# Patient Record
Sex: Female | Born: 1975 | Race: White | Hispanic: No | Marital: Married | Smoking: Former smoker
Health system: Southern US, Community
[De-identification: ages and names within clinical notes are randomized; demographics above are authoritative.]

## PROBLEM LIST (undated history)

## (undated) DIAGNOSIS — F32A Depression, unspecified: Secondary | ICD-10-CM

## (undated) DIAGNOSIS — R51 Headache: Secondary | ICD-10-CM

## (undated) DIAGNOSIS — C50919 Malignant neoplasm of unspecified site of unspecified female breast: Secondary | ICD-10-CM

## (undated) DIAGNOSIS — Z923 Personal history of irradiation: Secondary | ICD-10-CM

## (undated) DIAGNOSIS — R000 Tachycardia, unspecified: Secondary | ICD-10-CM

## (undated) DIAGNOSIS — I4510 Unspecified right bundle-branch block: Secondary | ICD-10-CM

## (undated) DIAGNOSIS — R87619 Unspecified abnormal cytological findings in specimens from cervix uteri: Secondary | ICD-10-CM

## (undated) DIAGNOSIS — I10 Essential (primary) hypertension: Secondary | ICD-10-CM

## (undated) DIAGNOSIS — R002 Palpitations: Secondary | ICD-10-CM

## (undated) DIAGNOSIS — K219 Gastro-esophageal reflux disease without esophagitis: Secondary | ICD-10-CM

## (undated) DIAGNOSIS — F329 Major depressive disorder, single episode, unspecified: Secondary | ICD-10-CM

## (undated) DIAGNOSIS — IMO0002 Reserved for concepts with insufficient information to code with codable children: Secondary | ICD-10-CM

## (undated) DIAGNOSIS — Z8759 Personal history of other complications of pregnancy, childbirth and the puerperium: Secondary | ICD-10-CM

## (undated) DIAGNOSIS — N63 Unspecified lump in unspecified breast: Secondary | ICD-10-CM

## (undated) DIAGNOSIS — R87629 Unspecified abnormal cytological findings in specimens from vagina: Secondary | ICD-10-CM

## (undated) DIAGNOSIS — O139 Gestational [pregnancy-induced] hypertension without significant proteinuria, unspecified trimester: Secondary | ICD-10-CM

## (undated) DIAGNOSIS — F419 Anxiety disorder, unspecified: Secondary | ICD-10-CM

## (undated) DIAGNOSIS — R55 Syncope and collapse: Secondary | ICD-10-CM

## (undated) HISTORY — DX: Unspecified abnormal cytological findings in specimens from cervix uteri: R87.619

## (undated) HISTORY — DX: Unspecified lump in unspecified breast: N63.0

## (undated) HISTORY — DX: Gastro-esophageal reflux disease without esophagitis: K21.9

## (undated) HISTORY — DX: Personal history of other complications of pregnancy, childbirth and the puerperium: Z87.59

## (undated) HISTORY — DX: Syncope and collapse: R55

## (undated) HISTORY — DX: Headache: R51

## (undated) HISTORY — DX: Major depressive disorder, single episode, unspecified: F32.9

## (undated) HISTORY — DX: Anxiety disorder, unspecified: F41.9

## (undated) HISTORY — DX: Palpitations: R00.2

## (undated) HISTORY — DX: Tachycardia, unspecified: R00.0

## (undated) HISTORY — DX: Malignant neoplasm of unspecified site of unspecified female breast: C50.919

## (undated) HISTORY — DX: Unspecified right bundle-branch block: I45.10

## (undated) HISTORY — DX: Depression, unspecified: F32.A

## (undated) HISTORY — DX: Reserved for concepts with insufficient information to code with codable children: IMO0002

## (undated) HISTORY — PX: CHOLECYSTECTOMY: SHX55

---

## 1999-05-31 ENCOUNTER — Encounter: Admission: RE | Admit: 1999-05-31 | Discharge: 1999-05-31 | Payer: Self-pay | Admitting: General Surgery

## 1999-05-31 ENCOUNTER — Encounter: Payer: Self-pay | Admitting: General Surgery

## 2000-04-10 ENCOUNTER — Other Ambulatory Visit: Admission: RE | Admit: 2000-04-10 | Discharge: 2000-04-10 | Payer: Self-pay | Admitting: Obstetrics and Gynecology

## 2001-04-02 ENCOUNTER — Other Ambulatory Visit: Admission: RE | Admit: 2001-04-02 | Discharge: 2001-04-02 | Payer: Self-pay | Admitting: Obstetrics and Gynecology

## 2002-04-15 ENCOUNTER — Other Ambulatory Visit: Admission: RE | Admit: 2002-04-15 | Discharge: 2002-04-15 | Payer: Self-pay | Admitting: Obstetrics and Gynecology

## 2003-05-03 ENCOUNTER — Other Ambulatory Visit: Admission: RE | Admit: 2003-05-03 | Discharge: 2003-05-03 | Payer: Self-pay | Admitting: Obstetrics and Gynecology

## 2004-05-10 ENCOUNTER — Other Ambulatory Visit: Admission: RE | Admit: 2004-05-10 | Discharge: 2004-05-10 | Payer: Self-pay | Admitting: Obstetrics and Gynecology

## 2005-05-17 ENCOUNTER — Other Ambulatory Visit: Admission: RE | Admit: 2005-05-17 | Discharge: 2005-05-17 | Payer: Self-pay | Admitting: Obstetrics and Gynecology

## 2006-02-19 HISTORY — PX: THERAPEUTIC ABORTION: SHX798

## 2006-02-19 HISTORY — PX: DILATION AND CURETTAGE OF UTERUS: SHX78

## 2006-06-24 ENCOUNTER — Inpatient Hospital Stay (HOSPITAL_COMMUNITY): Admission: AD | Admit: 2006-06-24 | Discharge: 2006-06-24 | Payer: Self-pay | Admitting: Obstetrics and Gynecology

## 2006-07-04 ENCOUNTER — Inpatient Hospital Stay (HOSPITAL_COMMUNITY): Admission: AD | Admit: 2006-07-04 | Discharge: 2006-07-04 | Payer: Self-pay | Admitting: Obstetrics and Gynecology

## 2007-05-28 ENCOUNTER — Inpatient Hospital Stay (HOSPITAL_COMMUNITY): Admission: AD | Admit: 2007-05-28 | Discharge: 2007-05-28 | Payer: Self-pay | Admitting: Obstetrics and Gynecology

## 2007-06-12 ENCOUNTER — Other Ambulatory Visit: Payer: Self-pay

## 2007-06-12 ENCOUNTER — Other Ambulatory Visit: Payer: Self-pay | Admitting: Obstetrics and Gynecology

## 2007-06-12 ENCOUNTER — Emergency Department (HOSPITAL_COMMUNITY): Admission: EM | Admit: 2007-06-12 | Discharge: 2007-06-13 | Payer: Self-pay | Admitting: Emergency Medicine

## 2007-06-17 HISTORY — PX: US ECHOCARDIOGRAPHY: HXRAD669

## 2007-11-03 ENCOUNTER — Observation Stay (HOSPITAL_COMMUNITY): Admission: AD | Admit: 2007-11-03 | Discharge: 2007-11-04 | Payer: Self-pay | Admitting: Obstetrics and Gynecology

## 2007-11-13 ENCOUNTER — Inpatient Hospital Stay (HOSPITAL_COMMUNITY): Admission: AD | Admit: 2007-11-13 | Discharge: 2007-11-15 | Payer: Self-pay | Admitting: Obstetrics and Gynecology

## 2007-12-13 ENCOUNTER — Inpatient Hospital Stay (HOSPITAL_COMMUNITY): Admission: AD | Admit: 2007-12-13 | Discharge: 2007-12-15 | Payer: Self-pay | Admitting: Obstetrics and Gynecology

## 2007-12-16 ENCOUNTER — Encounter: Admission: RE | Admit: 2007-12-16 | Discharge: 2008-01-15 | Payer: Self-pay | Admitting: Obstetrics and Gynecology

## 2008-02-20 HISTORY — PX: LIPOMA EXCISION: SHX5283

## 2008-05-25 ENCOUNTER — Encounter (INDEPENDENT_AMBULATORY_CARE_PROVIDER_SITE_OTHER): Payer: Self-pay | Admitting: Surgery

## 2008-05-25 ENCOUNTER — Ambulatory Visit (HOSPITAL_BASED_OUTPATIENT_CLINIC_OR_DEPARTMENT_OTHER): Admission: RE | Admit: 2008-05-25 | Discharge: 2008-05-25 | Payer: Self-pay | Admitting: Surgery

## 2010-02-19 HISTORY — PX: BREAST ENHANCEMENT SURGERY: SHX7

## 2010-03-13 ENCOUNTER — Ambulatory Visit: Payer: Self-pay | Admitting: Cardiology

## 2010-04-04 ENCOUNTER — Ambulatory Visit (INDEPENDENT_AMBULATORY_CARE_PROVIDER_SITE_OTHER): Payer: BC Managed Care – PPO | Admitting: *Deleted

## 2010-04-04 DIAGNOSIS — R002 Palpitations: Secondary | ICD-10-CM

## 2010-04-07 ENCOUNTER — Ambulatory Visit: Payer: Self-pay | Admitting: Cardiology

## 2010-05-03 ENCOUNTER — Ambulatory Visit (INDEPENDENT_AMBULATORY_CARE_PROVIDER_SITE_OTHER): Payer: BC Managed Care – PPO | Admitting: Nurse Practitioner

## 2010-05-03 DIAGNOSIS — R002 Palpitations: Secondary | ICD-10-CM

## 2010-05-16 ENCOUNTER — Telehealth: Payer: Self-pay | Admitting: *Deleted

## 2010-05-16 NOTE — Telephone Encounter (Signed)
LM re: wanting to have echo

## 2010-05-17 ENCOUNTER — Telehealth: Payer: Self-pay | Admitting: *Deleted

## 2010-05-17 NOTE — Telephone Encounter (Signed)
Pt called stating she had another episode last PM of nausea,"rush of heat";felt like going to pass out. Wanted to know if another Echo would be the next step to try to find out cause of this happening. Per Dr. Sw[place] does not feel like Echo would help. Advised her if could document episode then could evaluate. Tried to reassure her. Will call if occurs again. Otherwise will see in 42mo

## 2010-05-19 ENCOUNTER — Telehealth: Payer: Self-pay | Admitting: *Deleted

## 2010-05-19 NOTE — Telephone Encounter (Signed)
Called stating Dr. Odis Luster office had called her re her surgery in 2 wks. Wanted her to check to see if OK to stay on Metoprolol. Ok per Dr. Sw[place]and. LM for pt

## 2010-05-30 ENCOUNTER — Telehealth: Payer: Self-pay | Admitting: Cardiology

## 2010-05-30 NOTE — Telephone Encounter (Signed)
Wants [place] to fax over her heart monitor and EKG results and most recent OV notes.  Pt.has a release on file.

## 2010-05-31 ENCOUNTER — Telehealth: Payer: Self-pay | Admitting: Cardiology

## 2010-05-31 LAB — POCT HEMOGLOBIN-HEMACUE: Hemoglobin: 14.8 g/dL (ref 12.0–15.0)

## 2010-05-31 NOTE — Telephone Encounter (Signed)
Needs to know if patient can undergo general anestheiseology. I've pulled the chart please call cell phone.

## 2010-06-02 ENCOUNTER — Other Ambulatory Visit: Payer: Self-pay | Admitting: Family Medicine

## 2010-06-02 DIAGNOSIS — R42 Dizziness and giddiness: Secondary | ICD-10-CM

## 2010-06-02 DIAGNOSIS — G44009 Cluster headache syndrome, unspecified, not intractable: Secondary | ICD-10-CM

## 2010-06-08 ENCOUNTER — Other Ambulatory Visit: Payer: BC Managed Care – PPO

## 2010-06-08 ENCOUNTER — Ambulatory Visit
Admission: RE | Admit: 2010-06-08 | Discharge: 2010-06-08 | Disposition: A | Discharge status: Home / Self Care | Payer: 59 | Source: Ambulatory Visit | Attending: Family Medicine | Admitting: Family Medicine

## 2010-06-08 DIAGNOSIS — R42 Dizziness and giddiness: Secondary | ICD-10-CM

## 2010-06-08 DIAGNOSIS — G44009 Cluster headache syndrome, unspecified, not intractable: Secondary | ICD-10-CM

## 2010-06-08 MED ORDER — IOHEXOL 300 MG/ML  SOLN
75.0000 mL | Freq: Once | INTRAMUSCULAR | Status: AC | PRN
  Administered 2010-06-08: 75 mL via INTRAVENOUS

## 2010-07-04 NOTE — Discharge Summary (Signed)
NAMECESILIA, [name]               ACCOUNT NO.:  [id number]   MEDICAL RECORD NO.:  [id number]          PATIENT TYPE:  INP   LOCATION:  9158                          [facility]ACILITY:  [facility]   PHYSICIAN:  Janine Limbo, M.D.DATE OF BIRTH:  February 06, 1976   DATE OF ADMISSION:  11/03/2007  DATE OF DISCHARGE:  11/04/2007                               DISCHARGE SUMMARY   ADMITTING DIAGNOSES:  1. Intrauterine pregnancy at 33-4/7th weeks.  2. Status post fall with uterine contractions noted.   DISCHARGE DIAGNOSES:  1. Intrauterine pregnancy at 33-4/7th weeks.  2. Status post fall with uterine contractions noted.  3. Reassuring fetal heart rate status and no evidence of abruption or      trauma.   PROCEDURES:  None.   HOSPITAL COURSE:  [name] is a 35 year old gravida 2, para 0-1-0-0 at  33-4/7th weeks, who presented status post fall on the afternoon of  November 03, 2007.  She was at work and tripped over a box.  She did  land on her right hip and hip side of her belly on that box.  She denied  any leak or bleeding and reported positive fetal movement.  She was  unaware of any contractions at that time.  Her pregnancy has been  remarkable for:  1. History of a 19-week termination secondary to multiple anomalies.  2. Migraine headaches.  3. Gastroesophageal reflux disease.   The patient's blood type is noted to be A positive.  On admission, her  fetal heart rate was reactive.  She was having uterine contractions  every 3-5 minutes, although they were mild.  Her fetal fibronectin, GC,  and chlamydia were all negative.  Group B strep culture was currently  pending.  Cervix was closed along with vertex at a -3 station.  Her  abdomen was soft and nontender.  She had no evidence of bruising or  trauma to her extremities, and she had full range of motion in all her  extremities and no limitations.  She had an ultrasound done that showed  growth at the 53rd percentile with weight of 2135 g, 4  pounds 11 ounces.   Fluid volume was within normal limits at 18 cm, which was 66th  percentile.  Fetus was in the vertex presentation.  There was no  evidence of placental disruption and cervical length was 4.6 cm without  any funnel, and there were no acute abnormalities noted.  The patient  also had a CBC that was within normal limits and a negative fetal  fibronectin.  Dr. Pennie Rushing saw the patient later that evening, and the  plan was made to observe overnight fetal heart rate changes, with no  changes noted then the plan was to discharge her today.  On November 04, 2007, the patient was doing well.  She was unaware of any  contractions.  There were a few scattered contractions noted.  Fetal  heart rate had remained reactive.  Abdomen was soft and nontender.  There was still no bruising noted on the left hip or any other evidence  of trauma.  Extremities were normal.  Vital signs were stable.  She was  afebrile.  She was deemed to receive full benefit of her hospital stay  and was discharged home.   DISCHARGE INSTRUCTIONS:  1. Per antenatal discharge instructions.  2. The patient will monitor for fetal activity, bleeding, or any      increasing uterine contractions.  She will notify us of this issue.   DISCHARGE MEDICATIONS:  Will be the same that she was on when she came  in, which were;  1. Prenatal vitamin 1 p.o. daily.  2. Folic acid 4 times a day.  3. Zoloft 50 mg 1 p.o. daily.  4. Iron sulphate 325 mg 1 p.o. daily.  5. Colace 100 mg 1 p.o. daily.   Discharge followup will occur next week as scheduled at [facility]  OB[facility]GYN or p.r.n.      Renaldo Reel Emilee Hero, C.N.M.      Janine Limbo, M.D.  Electronically Signed    VLL/MEDQ  D:  11/04/2007  T:  11/05/2007  Job:  [id number]

## 2010-07-04 NOTE — Discharge Summary (Signed)
[name], [name]               ACCOUNT NO.:  [id number]   MEDICAL RECORD NO.:  [id number]          PATIENT TYPE:  INP   LOCATION:  9158                          [facility]   PHYSICIAN:  Janine Limbo, M.D.DATE OF BIRTH:  1975-11-16   DATE OF ADMISSION:  11/03/2007  DATE OF DISCHARGE:  11/04/2007                               DISCHARGE SUMMARY   ADDENDUM   On the day of discharge, November 04, 2007, Dr. Stefano Gaul saw the  patient and at that time the patient advised that she had hit her head  when she fell.  She said that her head was tender at the point of  impact, but she denied any nausea, vomiting, blurred vision, and  disorientation.  Dr. Stefano Gaul did a neuro examination that was grossly  within normal limits.  Her baby had remained reactive with no signs of  CNS damage.   Dr. Stefano Gaul spoke with Dr. Hyacinth Meeker, [facility].  He  stated that the patient should be okay to go home without further  workup.  The patient is to call for any signs of difficulties such as  nausea, vomiting, blurred vision, severe headache, weakness, dizziness,  or confusion.  The patient, her husband, and family seem to understand  this plan and agree to notify us with any issues.      Renaldo Reel Emilee Hero, C.N.M.      Janine Limbo, M.D.  Electronically Signed    VLL/MEDQ  D:  11/04/2007  T:  11/05/2007  Job:  [id number]

## 2010-07-04 NOTE — Discharge Summary (Signed)
[name], [name]               ACCOUNT NO.:  [id number]   MEDICAL RECORD NO.:  [id number]          PATIENT TYPE:  INP   LOCATION:  9149                          [facility]   PHYSICIAN:  Janine Limbo, M.D.DATE OF BIRTH:  [birth year]   DATE OF ADMISSION:  11/13/2007  DATE OF DISCHARGE:  11/15/2007                               DISCHARGE SUMMARY   ADMITTING DIAGNOSES:  1. Intrauterine pregnancy at 34-6/7 weeks.  2. Elevated blood pressures with headache.  3. Normal PIH labs.  4. Elevated white blood cell count with acute nausea, vomiting, but      afebrile.   DISCHARGE DIAGNOSES:  1. Intrauterine pregnancy at 35-1/7 weeks.  2. No evidence of preeclampsia.  3. Occasional labile blood pressures.   PROCEDURES:  None.   HOSPITAL COURSE:  [name] is a 35 year old gravida 2, para 0-0-1-0 at  34-6/7 weeks who presented on the evening of November 13, 2007, with a  report of headache, unrelieved by medication.  She also has had an  elevated blood pressure noted over the course of the day.  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  She also has had nausea and vomiting.  Headache was  persistent, but did not worsen.  Fetus had been breech previously, but  was noted to be vertex on bedside ultrasound in maternity admissions.  Her pregnancy had been remarkable for;  1. History of migraines.  2. Gastroesophageal reflux disease.  3. History of 19-week termination for severe anomalies with her first      pregnancy.   PHYSICAL EXAMINATION:  VITAL SIGNS:  On admission, blood pressures were  in the 130s-140s over 90s to 100, pulse was 105-117.  Fetal heart rate  was reactive.  There were very occasional very mild contractions noted.  She was afebrile.  ABDOMEN:  Soft and nontender and nonacute.  She had had group B strep,  GC and Chlamydia cultures, and fibronectin that were negative on  September 14.  EXTREMITIES:  She  did have 1+ pitting edema and 1 beat of clonus on the  left, 3+ DTRs.   LABORATORY VALUES:  Showed a hemoglobin of 12.7, white blood cell count  of 13.1 and platelet count of 323.  UA was normal except for specific  gravity of less than 1.005 and 15 of ketones.  There was no protein  noted.  LDH and uric acid were normal.  The white blood cell count  differential was within normal limits.  Comprehensive metabolic panel  was also normal with normal AST and ALT.  Due to the vomiting and mild  elevation of blood pressure and headache, the patient was admitted for  23-hour initial observation and initiation of collection of 24-hour  urine.  She was placed on IV hydration and was observed closely.  By the  morning of September 25, the patient was feeling better.  She had a dull  headache that is better than when she came in.  She was having no  contractions.  Her blood pressure  that morning was 110/63.  Range had  been 106-124 over 61-87, pulse was still slightly high at 112.  Fetal  heart rate was reactive.  There were no contractions noted.  A 24-hour  urine remained in progress.  By the morning of September 26, she was  continued to do well.  She only had a mild headache.  She denied any  visual symptoms or epigastric pain.  Her blood pressure was 128/88,  range was from 106-144 over 61-95, pulse was 81-118.  She was afebrile.  Abdomen was soft and nontender.  Fetal monitoring shows a reactive fetal  heart tracing and no decelerations.  There were some very occasional  mild contractions.  A 24-hour urine sample showed 74 mg of protein,  creatinine of 0.65, and creatinine clearance of 119.  Extremity still  showed deep tendon reflexes 2-3+, but there was no clonus and a trace  edema.  Dr. Stefano Gaul was consulted.  The decision was made to discharge  the patient home in light of her improved headache and the blood  pressure and no findings of preeclampsia.  She was deemed to receive  full  benefit of hospital stay and was discharged home.   DISCHARGE INSTRUCTIONS:  The patient is to monitor for signs and  symptoms of pregnancy-induced hypertension or preeclampsia.  She will  also monitor for any severe nausea, vomiting, or other viral symptoms.  She also will note fetal movement.   DISCHARGE MEDICATIONS:  Prenatal vitamin 1 p.o. daily.   Discharge followup will occur on November 09, 2007 at [facility] for a blood pressure recheck.  The office will call the patient on  November 17, 2007 to schedule this.  She currently has an appointment  on Thursday of this following week, that appointment be held at present  until she is evaluated on Tuesday and then the decision will be made  whether that appointment still needs to be kept.      [name], C.N.M.      Janine Limbo, M.D.  Electronically Signed    VLL/MEDQ  D:  11/15/2007  T:  11/15/2007  Job:  [id number]

## 2010-07-04 NOTE — H&P (Signed)
NAMEHAROLYN, [name]               ACCOUNT NO.:  [id number]   MEDICAL RECORD NO.:  [id number]          PATIENT TYPE:  INP   LOCATION:  9161                          [facility]   PHYSICIAN:  Osborn Coho, M.D.   DATE OF BIRTH:  1975-03-11   DATE OF ADMISSION:  11/13/2007  DATE OF DISCHARGE:                              HISTORY & PHYSICAL   [name] is a 35 year old married white female gravida 2, para 0-0-1-0  at 34-6/7 weeks for an [facility] of December 19, 2007 who presents this evening  with report of headache this afternoon and relieved with Tylenol and  increase in blood pressure over the course of her day.  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  The first blood  pressure reading she had was per verbal report 122/72.  She continued to  not feel very well and they rechecked again, it was 130/88.  After  getting home she had a temporal headache which started and rechecked her  blood pressure and was 140/90.  All of her blood pressures she states  were in her right arm and in a sitting position.  She denies any visual  changes, right upper quadrant or epigastric pain.  While she has been in  MAU she has had positive nausea and vomiting (bile emesis). Her headache  persists, it is not worse.  She reports fetus was breech at last check  and has a follow-up ultrasound next Thursday in the office for  presentation and fluid.  She denies fever, cough, shortness of breath,  UTI signs or symptoms, leakage of fluid, vaginal bleeding or abnormal  discharge.  She has been followed by MD service at [facility]. Her history has  been remarkable for:  1. A history of migraines.  2. GERD.  3. History of 19-week elective abortion with first pregnancy secondary      to severe anomalies and neural tube defect.   She reports positive fetal movement today, has felt some abdominal  tightening.Marland Kitchen   PRENATAL  LABS:  Blood type is A+, hemoglobin at her new OB was 14.4 and  platelets were 359.  Cystic fibrosis screen negative.  Those are the  only available prenatal labs I have at this time.   PAST MEDICAL HISTORY:  She has ERYTHROMYCIN and BACTRIM allergies which  cause vomiting and diarrhea.  Denies latex allergy or other  sensitivities.  She reports menarche at [age] years of age with monthly  cycle, no abnormalities. She did have an unsure LMP and dating is by 5-  week ultrasound. Reports oral contraceptive pills in the past for  contraception.  The history of the elective abortion with subsequent D&C  for retained placenta.  History of GERD.  Frequent UTIs.   SURGICAL HISTORY:  Includes laparoscopic cholecystectomy in 2005 and a  D&C in 2008.  She had been hospitalized one other time for pneumonia as  a child.   GENETIC HISTORY:  Remarkable for the first baby with neural tube detect  and other severe anomalies.  Father of baby's uncle VSD died.   FAMILY HISTORY:  Maternal grandfather heart disease.  Maternal  grandmother and maternal grandfather high blood pressure. Maternal  grandmother COPD. Maternal grandmother diabetes.  Maternal grandfather  cigarette abuse.   SOCIAL HISTORY:  Married white female.  Father of baby and husband's  name is Aanchal Cope, he is involved and supportive.  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  Father  of baby has Associate's degree and works full-time as a Merchandiser, retail. The  patient denies alcohol, tobacco or illicit drug use.  The patient  entered care for new OB workup April 22.  She was around 12-6/7 weeks.  Pap was deferred at that time secondary to history of a previous  placenta previa. She planned care with Dr. Estanislado Pandy at that time. She had  been on 4 mg of folic acid prior to conception. Had first trimester  screen on 22nd as well and sonogram was normal.  Spine was visualized.  She was having some issues  early on, late first trimester and early  second trimester with severe constipation.  She was evaluated in  maternity admission unit for this problem and was noted to have high  heart rate running 130s to 150s, was then sent to [facility]  and had a normal EKG and negative spiral CT.  Hemoglobin was 12.2.  Basic metabolic panel was normal.  TSH, T3 and T4 were normal.  She did  have a slightly elevated white blood cell count at that time which was  22.2.  She had an echocardiogram which was normal at [facility]  radiology and impression by Dr. Estanislado Pandy was unexplained asymptomatic  tachycardia.  Per cardiology no further testing was recommended. At 15-  5/7 weeks hemoglobin was within normal limits and white blood cell count  was down to 12.4. First trimester screen was normal.  Her heart rate at  that visit was 92.  She had AFP drawn at that time as well.  At 19-5/7  weeks had anatomy ultrasound; single intrauterine pregnancy, size equal  to dates.  Cervical length 4.01 cm, normal anatomy and development,  posterior placenta, normal AFI, suggestive female. They plan to name the  baby Colton. Maternal heart rate on that visit was 88.  Around 24 weeks  she was complaining of some expressible white discharge and saw Dr.  Pennie Rushing and her impression was probable accessory milk duct. She was  measuring size greater than dates. 1-hour Glucola was at 28-3/7 weeks.  Hemoglobin at that time was 11.2.  She was still measuring slightly  greater than dates and plan was made to have an ultrasound. Ultrasound  on that day; AFI was within normal limits. That is all of her prenatal  record that I have available at this time. Besides the previous breech  presentation I am unaware of any other complications in her pregnancy.   OBJECTIVE:  VITAL SIGNS:  Since in MAU blood pressure 137/98, 139/100  and 148/90, temperature 98 degrees and heart rate 105-117, respirations  18.  Fetal heart rate has had a  baseline of 140, reactive, no  decelerations with moderate variability.  Toco; she has had some  occasional ripples of irritability which were mild on palpation.  GENERAL:  No acute distress, alert and oriented x3.  Just some general  malaise.  She is well dressed, well nourished.  CARDIOVASCULAR:  Regular rate and rhythm without murmur.  LUNGS:  Clear to auscultation bilaterally.  ABDOMEN:  Soft, nontender and gravid.  PELVIC:  Exam was deferred.  I did do a bedside ultrasound; vertex  presentation is noted.  She did have GBS and GC chlamydia cultures as  well as a fetal fibronectin which were all negative on November 03, 2007.  EXTREMITIES:  Lower extremities with 1+ pitting edema.  She has a beat  of clonus on her left. DTRs are hyperreflexic about 3+.   LABS:  Are as follows.  White count 13.1, hemoglobin 12.7, hematocrit  37, platelets 323.  UA is within normal limits with the exception of  specific gravity less than 1.005 and 15 ketones.  There is no  proteinuria.  Her CBC differential had absolute granulocytes that were  slightly high equal to 10.  Her LDH was within normal limits equal to  132.  Uric acid within normal limits equal to 4.2. CMET within normal  limits.  AST was 22 and ALT was 17.   IMPRESSION:  1. Intrauterine pregnancy at 34-6/7 weeks.  2. Elevated blood pressures with headache.  3. Normal pregnancy-induced hypertension labs.  4. Increased white blood cell count with acute nausea, vomiting, but      afebrile.  5. Vertex after being previously breech.   PLAN:  1. Consulted with Dr. Su Hilt and per Dr. Su Hilt admit with her as      attending to antenatal for 23-hour observation for continued blood      pressure observation and collection of 24-hour urine. She is going      to have LR      at 100 mL per hour, regular diet as tolerated, bedrest with      bathroom privileges, strict Is and Os and continuous external      monitoring.  2. Will continue to  observe closely and MD to follow.      Candice Denny Levy, CNM      ______________________________  Osborn Coho, M.D.    CHS/MEDQ  D:  11/13/2007  T:  11/14/2007  Job:  [id number]

## 2010-07-04 NOTE — H&P (Signed)
[name], [name]               ACCOUNT NO.:  [id number]   MEDICAL RECORD NO.:  [id number]          PATIENT TYPE:  INP   LOCATION:  9158                          [facility]   PHYSICIAN:  Hal Morales, M.D.DATE OF BIRTH:  1976/02/17   DATE OF ADMISSION:  11/03/2007  DATE OF DISCHARGE:                              HISTORY & PHYSICAL   This is a 35 year old gravida 2, para 0-1-0-0, at 33-4/7th weeks, who  presents status post a fall today at work, landing on her right hip and  hitting the side of her belly with a box.  Pregnancy has been remarkable  for,  1. History of loss secondary to multiple anomalies.  2. Migraines.  3. GERD.   ALLERGIES:  ERYTHROMYCIN and BACTRIM cause vomiting and diarrhea.   OB HISTORY:  Remarkable for an elective AB in 2008 of a female infant at  19-5/7th weeks gestation for large neural tube defect, lemon sign,  banana sign, clubfoot, and anterior previa.  This was followed by [facility]  for retained placenta.   MEDICAL HISTORY:  Remarkable for childhood varicella and GERD.   SURGICAL HISTORY:  Remarkable for a laparoscopic cholecystectomy in 2005  and a D&C in 2008.   FAMILY HISTORY:  Remarkable for father with heart disease.  Mother and  father with hypertension.  Mother and grandmother with COPD.  Grandmother with diabetes.   GENETIC HISTORY:  Remarkable for first baby with multiple anomalies and  father and baby's uncle with VSD.   SOCIAL HISTORY:  The patient is married to [name] who is involved  and supportive.  She does not report of religious affiliation.  She  works as a Clinical biochemist, and does not report any alcohol, tobacco, or drug use.   PRENATAL LABS:  Hemoglobin 14.4, platelets 359, blood type A positive,  rubella immune, and cystic fibrosis negative.   HISTORY OF CURRENT PREGNANCY:  The patient entered care at 12 weeks'  gestation.  She had a first trimester screen and ultrasound that was  normal.  She was seen for some constipation,  shortly thereafter and was  noted to have tachycardia at that time.  She had a full Cardiology  workup which was negative and had no further problems with that.  Ultrasound at 19 weeks was normal with a posterior placenta.  Glucola at  28 weeks was normal.  Ultrasound at that time showed a normal amniotic  fluid index and normal growth.  Hemoglobin at that time was 11.2.   OBJECTIVE DATA:  VITAL SIGNS:  Stable.  GENERAL:  Afebrile.  HEENT:  Within normal limits.  NECK:  Thyroid normal, not enlarged.  CHEST:  Clear to auscultation.  HEART:  Regular rate and rhythm.  ABDOMEN:  Soft and nontender, gravid at 33 cm.  Fetal monitor shows a  reactive fetal heart rate with contractions every 3-5 minutes which the  patient does not feel; however, they are 45-60 seconds in duration.  Fetal fibronectin, cultures, and Group B strep are sent.  Cervical exam  is pending and will be done after this dictation.  EXTREMITIES:  No  ecchymosis visible on the hip.  There is good range of  motion and no limitations in her functional status.   ASSESSMENT:  1. Intrauterine pregnancy at 33-4/7th weeks.  2. Status post fall with contractions.   PLAN:  1. Admit per Dr. Renford Dills.  2. Routine antenatal orders.  3. OB ultrasound, complete.  4. Motrin x1 dose to alleviate soreness and alleviate some      contractions.      Marie L. Mayford Knife, C.N.M.      ______________________________  Hal Morales, M.D.    MLW/MEDQ  D:  11/03/2007  T:  11/04/2007  Job:  [id number]

## 2010-07-04 NOTE — Op Note (Signed)
[name], [name]               ACCOUNT NO.:  [id number]   MEDICAL RECORD NO.:  [id number]          PATIENT TYPE:  AMB   LOCATION:  DSC                          FACILITY:  [facility]   PHYSICIAN:  Currie Paris, M.D.DATE OF BIRTH:  October 14, 1975   DATE OF PROCEDURE:  DATE OF DISCHARGE:                               OPERATIVE REPORT   PREOPERATIVE DIAGNOSIS:  Lipoma, left hip.   POSTOPERATIVE DIAGNOSIS:  Lipoma, left hip.   OPERATION:  Excision of lipoma of left hip, 5 cm.   SURGEON:  Currie Paris, M.D.   ANESTHESIA:  General.   CLINICAL HISTORY:  This is a 35 year old lady who has had a soft tissue  mass over the left hip that is gradually enlarging, and become more  tender.  She ihas been seen over the years by couple of my partners for  this and at this point she elected to have it removed.  It is not well  defined and I thought represented a lipoma but because of its location I  thought it was going to be ill defined with a lot of fibrous tissue  around it.  We offered her just continued follow up, but she wished to  have this removed.   DESCRIPTION FOR PROCEDURE:  The patient was seen in the holding area and  had no further questions.  With her standing, I was able to identify and  mark the exact area putting a circle around it.   The patient was then taken to the operating room.  After satisfactory  general anesthesia have been obtained, she was placed in lateral  position with appropriate padding.  The area was prepped and draped.  The time-out was done.   The incision was made directly over the center of the mass.  I divided  the subcutaneous tissue and identified some lobulated fat.  With some  blunt dissection, I was able to see a fat plane superiorly and come  under all of this, but more deep and laterally and inferiorly it was  less well defined.  This came out somewhat fragmented but I thought  represented poorly defined lipoma with a lot of fibrous  tissue around  it.  We took tissue for the entire area that I had marked with a circle  and going down to depth of 3-5 cm to be sure I had this area completely  excised.   This was all done with either blunt dissection or cautery.  Once it was  out, I irrigated.  I put Marcaine in to make sure that she would have  some postop pain relief and check to make sure everything was dry.  I  closed in layers with some 2-0 Vicryl first and 4-0 Monocryl  subcuticular and lastly some Dermabond.   The patient tolerated the procedure well and there were no  complications.  All counts were correct.      Currie Paris, M.D.  Electronically Signed     CJS/MEDQ  D:  05/25/2008  T:  05/25/2008  Job:  [id number]   cc:   Raynelle Jan,  M.D. 

## 2010-07-04 NOTE — H&P (Signed)
NAMEJULEEN, SORRELS               ACCOUNT NO.:  [id number]   MEDICAL RECORD NO.:  [id number]          PATIENT TYPE:  INP   LOCATION:  9122                          FACILITY:  [facility]   PHYSICIAN:  Crist Fat. Rivard, M.D. DATE OF BIRTH:  14-Dec-1975   DATE OF ADMISSION:  12/13/2007  DATE OF DISCHARGE:                              HISTORY & PHYSICAL   HISTORY OF PRESENT ILLNESS:  The patient is a 35 year old gravida 2,  para 0-0-1-0, who was admitted 39-1/7th weeks' gestation with  spontaneous rupture of membranes at 0900 on the day of admission.  The  patient reports occasional uterine contraction with some slightly  increased intensity since the rupture of membranes.  The patient denies  any bleeding.  The patient reports that her fetus has been moving  normally.  The patient denies headache, vision changes, or right upper  quadrant pain.  The patient's pregnancy has been followed by the MD  Service at [facility].  The patient has been on bedrest for  the past 4 weeks due to elevated blood pressures.  The patient reports  last 24-hour urine done approximately 2-3 weeks ago.  Also, significant  the patient's fetus previously noted to be breech at 36 weeks, however,  vertex 2 weeks ago.  The patient's pregnancy was remarkable for:  1. History of migraines.  2. GERD.  3. History of neural tube defect, previous pregnancy.  4. Group B strep negative.   HISTORY OF PRESENT PREGNANCY:  The patient entered care at 82 weeks'  gestation.  The patient's Pap was deferred at new OB due to fear of  placenta previa and bleeding.  The patient with complaints of  constipation at 12 weeks, resolved with Fleet Enema.  The patient also  noted to have elevated maternal heart rate at approximately [redacted] weeks  pregnant.  The patient was referred to Cardiology at that time.  The  patient also with syncopal episode, related to tachycardia.  The  patient's EKG was within normal limits.  Spiral CT  negative.  Basic  metabolic profile was negative.  TSH, T4, and T3 were within normal  limits; and a normal echo.  At 13 weeks, it was determined that further  testing was needed regarding tachycardia.  The patient underwent first  trimester screening and returned with normal values.  Ultrasound at 19  weeks with normal anatomy, posterior placenta, normal fluid, and cervix  4.01 cm.  The patient with complaints at 24 weeks with right breast with  expressible white discharge and nontender, found to be consistent with a  milk duct.  Size greater than dates noted at that time as well.  Ultrasound repeated at 28 weeks due to size greater than dates.  Amniotic fluid index was 65 percentile and ultrasound was within normal  limits.  The patient underwent Glucola at 28 weeks and hemoglobin was  noted to be 11.2.  The patient was started on iron supplement at that  time as well.  One-hour Glucola returned with a value of 136, and the  patient was scheduled for a 3-hour Glucola.  Three-hour  Glucola values  without evidence of gestational diabetes.  At 32 weeks and 6 days, the  patient again noted to have increased fundal height.  Ultrasound was  obtained with fetal weight, estimated at 60 second percentile, and  amniotic fluid index of 22.6.  At that time, fetus was noted to be  breech.  The patient was otherwise feeling well.  At 33 weeks, the  patient fell and hit her abdomen and was sent to maternity admissions  for monitoring with no problems and was discharged to home.  At 34  weeks, breech options were reviewed.  At 35 plus weeks, the patient  presented with headache and increased blood pressure, and maternity  admissions.  A 24-hour urine and PIH labs were obtained and were within  normal limits.  Group B strep was obtained at 35 weeks and returned with  negative group B strep, negative GC and chlamydia, and negative fetal  fibronectin at 34 weeks from MAU visit.  Headache resolved.  At 35   weeks, blood pressure noted to be 120/80.  No further prenatal records  are available for review regarding blood pressures.   PRENATAL LABORATORIES:  Initial prenatal labs obtained, 06/11/2007  hemoglobin 14.4, platelets 359,000, blood type A+, rubella titer immune,  and cystic fibrosis negative.  Other results are unavailable.  First  trimester screen was negative.  One-hour Glucola, 136.  Hemoglobin at 28  weeks 11.2.  Three-hour Glucola fasting 64, 1-hour 151, 2-hour 134, and  3-hour 132.  Group B strep negative.  GC negative.  Chlamydia negative.   OB HISTORY:  Pregnancy #1, 08/21/2006, elective interruption of  pregnancy at 19 weeks and 5 days due to an anomalous fetus.  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  The  patient underwent D and C due to her retaining placenta at that time.  Pregnancy #2 is current.   GYN HISTORY:  The patient had used birth control pills in the past.  The  patient denies any history of abnormal Paps or STDs.  The patient with  occasional candidiasis.   PAST MEDICAL HISTORY:  Significant for migraines, gastroesophageal  reflux disease, and tachycardia as noted above.   PAST SURGICAL HISTORY:  Lap cholecystectomy in 2005 and D&C in 2008.   OTHER HOSPITALIZATIONS:  Pneumonia as a child.   CURRENT MEDICATIONS:  Prenatal vitamin, folic acid, iron, stool  softener, and Zoloft being used for anxiety.   ALLERGIES:  AZITHROMYCIN and BACTRIM.   FAMILY HISTORY:  Father with heart disease and hypertension.  Mother  with hypertension and COPD.  Maternal grandmother with COPD and  diabetes.  Otherwise, negative.   GENETIC HISTORY:  First baby with neural tube defect.  Father of the  baby's uncle with congenital heart defect.  Otherwise, negative.   SOCIAL HISTORY:  The patient is married to the father of the baby,  Marthann Schiller, who was involved in supportive.  The patient works as a Clinical biochemist.  The  patient with 2 years of college  education.  Father of the baby is a  Nurse, children's with associates degree.  The patient denies use of  tobacco, alcohol, or street drugs.  The patient's domestic violence  screen is negative.   OBJECTIVE:  VITAL SIGNS:  Blood pressure initially on admission 136/102  with repeat of 122/79, 130/87, 130/86, and 131/99.  Maternal heart rate  in the 120s upon admission, then down to 90s.  The patient is afebrile.  SKIN:  Warm and dry.  Color is satisfactory.  GENERAL:  The patient is alert and oriented, in no apparent distress,  but does appear somewhat anxious.  HEENT:  Within normal limits.  Thyroid not enlarged.  HEART:  Regular rate and rhythm.  LUNGS:  Clear.  BREASTS:  Soft.  ABDOMEN:  Soft.  Gravida.  Nontender.  Vertex Leopold's.  Fundal height  extending 40 cm, above the symphysis pubis.  Fetal heart rate in the  150s, baselines with accelerations and variability present.  Uterine  contractions are noted approximately every 5-8 minutes, it is mild to  palpation.  PELVIC:  The patient is noted to have clear amniotic fluid with vernix  on the perineum consistent with spontaneous rupture of membranes.  Positive Nitrazine.  Sterile vaginal exam of the cervix reveals cervix  to be 3 cm dilated, 90% effaced, -2 station, and vertex.  Fetus is  vertex to the bedside ultrasound.  EXTREMITIES:  Negative edema.  Deep tendon reflexes of 3+, no clonus.   ASSESSMENT:  1. Intrauterine pregnancy at 25 and 1/7th weeks' gestation.  2. Spontaneous rupture of membranes.  3. Pregnancy-induced hypertension.   PLAN:  The patient admitted to birthing suite for consult with Dr.  Estanislado Pandy.  The patient desires epidural anesthesia.  We will obtain PIH  labs and cath urine for protein upon admission.      Rhona Leavens, CNM      ______________________________  Crist Fat Rivard, M.D.    NOS/MEDQ  D:  12/13/2007  T:  12/14/2007  Job:  [id number]

## 2010-09-08 ENCOUNTER — Other Ambulatory Visit: Payer: Self-pay | Admitting: Cardiology

## 2010-09-08 MED ORDER — METOPROLOL TARTRATE 25 MG PO TABS: 25.0000 mg | ORAL_TABLET | Freq: Two times a day (BID) | ORAL | Status: DC

## 2010-09-08 NOTE — Telephone Encounter (Signed)
Calling to request a refill of Metoprolol at [facility] [phone]. Please call back. I have pulled the chart.

## 2010-09-08 NOTE — Telephone Encounter (Signed)
escribe medication per fax request  

## 2010-10-25 ENCOUNTER — Encounter: Payer: Self-pay | Admitting: Cardiology

## 2010-11-03 ENCOUNTER — Ambulatory Visit (INDEPENDENT_AMBULATORY_CARE_PROVIDER_SITE_OTHER): Payer: 59 | Admitting: General Surgery

## 2010-11-03 ENCOUNTER — Encounter (INDEPENDENT_AMBULATORY_CARE_PROVIDER_SITE_OTHER): Payer: Self-pay | Admitting: General Surgery

## 2010-11-03 VITALS — BP 116/82 | HR 68 | Temp 98.4°F | Ht 66.0 in | Wt 152.6 lb

## 2010-11-03 DIAGNOSIS — N644 Mastodynia: Secondary | ICD-10-CM

## 2010-11-03 NOTE — Progress Notes (Signed)
Chief Complaint  Patient presents with  . Other    est pt for streck- eval lt br mass    HPI [name] is a 35 y.o. female. This patient was referred by Dr. Odis Luster for evaluation of tenderness in her left breast and chest wall. She states that approximately 11 years ago she found a "knot" in the area of her left breast which has caused some tenderness mainly with palpation. It hasn't bothered her much for the last 3 years which has become more tender to palpation. Not noticed and change in size.  She denies doing her own breast exams but denies any other masses or lumps. She denies any family history of breast cancer. She does not take any oral contraceptive pills or hormones. She had a CT scan of the chest approximately 11 years ago and the report is available to me which did not demonstrate any definite mass but it was described as a possible lipoma. She is scheduled for breast augmentation in 2 weeks with Dr. Odis Luster and he referred her for evaluation of this mass to see if needed excision preop or if possible joint procedure could be performed at the time of his surgery. HPI  Past Medical History  Diagnosis Date  . Anxiety   . Depression   . History of miscarriage   . Syncope and collapse   . Incomplete RBBB   . Palpitations   . Tachycardia   . Breast mass     left    Past Surgical History  Procedure Date  . Cholecystectomy   . US echocardiography 06/17/2007    EF 55-60%  . Lipoma excision 2010    left leg    Family History  Problem Relation Age of Onset  . Hypertension Mother   . Hyperlipidemia Mother   . Heart attack Father     X2  . Crohn's disease Brother     Social History History  Substance Use Topics  . Smoking status: Never Smoker   . Smokeless tobacco: Not on file  . Alcohol Use: No    Allergies  Allergen Reactions  . Bactrim   . Erythromycin   . Sulfa Drugs Cross Reactors     Current Outpatient Prescriptions  Medication Sig Dispense Refill  .  ketoprofen (ORUDIS) 75 MG capsule Take 75 mg by mouth 4 (four) times daily as needed.        . metoprolol tartrate (LOPRESSOR) 25 MG tablet Take 1 tablet (25 mg total) by mouth 2 (two) times daily.  60 tablet  5  . sertraline (ZOLOFT) 100 MG tablet Take 100 mg by mouth daily.          Review of Systems Review of Systems  Constitutional: Negative.   HENT: Negative.   Eyes: Negative.   Respiratory: Negative.   Cardiovascular: Positive for palpitations.  Gastrointestinal: Negative.   Musculoskeletal: Negative.   Skin: Negative.   Neurological: Negative.        Headaches   Hematological: Negative.   Psychiatric/Behavioral: Negative.     Blood pressure 116/82, pulse 68, temperature 98.4 F (36.9 C), temperature source Temporal, height 5\' 6"  (1.676 m), weight 152 lb 9.6 oz (69.219 kg).  Physical Exam Physical Exam  Constitutional: She is oriented to person, place, and time. She appears well-developed and well-nourished. No distress.  HENT:  Head: Normocephalic and atraumatic.  Eyes: Conjunctivae and EOM are normal. Pupils are equal, round, and reactive to light. Right eye exhibits no discharge. Left eye exhibits no  discharge. No scleral icterus.  Neck: Normal range of motion. Neck supple. No tracheal deviation present.  Cardiovascular: Normal rate, regular rhythm and normal heart sounds.   Pulmonary/Chest: Effort normal and breath sounds normal. No stridor. No respiratory distress. She has no wheezes.       Right breast is normal to exam without any suspicious masses, skin changes, or lymphadenopathy  Left breast does not have any suspicious masses, skin changes or lymphadenopathy. She has on the lateral chest wall at the 9:00 position a fullness of tissue but not a discrete mass in the area of tenderness.  Abdominal: Soft. Bowel sounds are normal.  Musculoskeletal: Normal range of motion.  Neurological: She is alert and oriented to person, place, and time.  Skin: Skin is warm and  dry. No rash noted. She is not diaphoretic. No erythema. No pallor.  Psychiatric: She has a normal mood and affect. Her behavior is normal. Judgment and thought content normal.    Data Reviewed CT chest from 2001  Assessment    Left breast and chest tenderness without evidence of discrete mass. I do feel some fullness of tissue in the area of concern but this does not appear to be any dominant or suspicious mass. This may be a small lipoma. She has no family history or personal history of breast cancer or no other significant risk factors. I think this is low likelihood for malignancy.    Plan    I have recommended diagnostic mammogram and ultrasound in the area of concern to evaluate for discrete mass. If there is no discrete mass identified on imaging and I would recommend surveillance with clinical exam. Since I cannot feel any definitive mass on exam this would be difficult to excise. If a mass is identified on imaging and we could potentially proceed with excisional biopsy with localization. I will see her back next week after her imaging studies.       Lodema Pilot DAVID 11/03/2010, 10:41 AM

## 2010-11-07 ENCOUNTER — Ambulatory Visit
Admission: RE | Admit: 2010-11-07 | Discharge: 2010-11-07 | Disposition: A | Discharge status: Home / Self Care | Payer: 59 | Source: Ambulatory Visit | Attending: General Surgery | Admitting: General Surgery

## 2010-11-07 DIAGNOSIS — N644 Mastodynia: Secondary | ICD-10-CM

## 2010-11-08 ENCOUNTER — Encounter (INDEPENDENT_AMBULATORY_CARE_PROVIDER_SITE_OTHER): Payer: Self-pay | Admitting: General Surgery

## 2010-11-08 ENCOUNTER — Encounter: Payer: Self-pay | Admitting: Cardiology

## 2010-11-08 ENCOUNTER — Ambulatory Visit (INDEPENDENT_AMBULATORY_CARE_PROVIDER_SITE_OTHER): Payer: 59 | Admitting: Cardiology

## 2010-11-08 ENCOUNTER — Ambulatory Visit (INDEPENDENT_AMBULATORY_CARE_PROVIDER_SITE_OTHER): Payer: 59 | Admitting: General Surgery

## 2010-11-08 VITALS — BP 100/60 | HR 60 | Ht 66.0 in | Wt 162.4 lb

## 2010-11-08 DIAGNOSIS — N644 Mastodynia: Secondary | ICD-10-CM

## 2010-11-08 DIAGNOSIS — F419 Anxiety disorder, unspecified: Secondary | ICD-10-CM

## 2010-11-08 DIAGNOSIS — R002 Palpitations: Secondary | ICD-10-CM

## 2010-11-08 DIAGNOSIS — R000 Tachycardia, unspecified: Secondary | ICD-10-CM

## 2010-11-08 NOTE — Progress Notes (Signed)
   [name] Date of Birth: Nov 22, 1975   History of Present Illness: [name] is seen today for followup of her palpitations. She has been doing very well. She has had very minimal symptoms of palpitations. In fact her last prolonged episode she has was last February. At that time she was seen in emergency room and apparently given medication to her IV that stopped her arrhythmia. We have no documentation of SVT. Previous event monitor was unremarkable. She is on chronic anxiety medication.  Current Outpatient Prescriptions on File Prior to Visit  Medication Sig Dispense Refill  . ketoprofen (ORUDIS) 75 MG capsule Take 75 mg by mouth 4 (four) times daily as needed.        . metoprolol tartrate (LOPRESSOR) 25 MG tablet Take 1 tablet (25 mg total) by mouth 2 (two) times daily.  60 tablet  5  . sertraline (ZOLOFT) 100 MG tablet Take 100 mg by mouth daily.          Allergies  Allergen Reactions  . Bactrim   . Erythromycin   . Sulfa Drugs Cross Reactors     Past Medical History  Diagnosis Date  . Anxiety   . Depression   . History of miscarriage   . Syncope and collapse   . Incomplete RBBB   . Palpitations   . Tachycardia   . Breast mass     left    Past Surgical History  Procedure Date  . Cholecystectomy   . US echocardiography 06/17/2007    EF 55-60%  . Lipoma excision 2010    left leg    History  Smoking status  . Never Smoker   Smokeless tobacco  . Not on file    History  Alcohol Use No    Family History  Problem Relation Age of Onset  . Hypertension Mother   . Hyperlipidemia Mother   . Heart attack Father     X2  . Crohn's disease Brother     Review of Systems: The review of systems is positive for history of anxiety. This is currently well controlled on medication. All other systems were reviewed and are negative.  Physical Exam: BP 100/60  Pulse 60  Ht 5\' 6"  (1.676 m)  Wt 162 lb 6.4 oz (73.664 kg)  BMI 26.21 kg/m2 The patient is alert and  oriented x 3.  The mood and affect are normal.  The skin is warm and dry.  Color is normal.  The HEENT exam reveals that the sclera are nonicteric.  The mucous membranes are moist.  The carotids are 2+ without bruits.  There is no thyromegaly.  There is no JVD.  The lungs are clear.  The chest wall is non tender.  The heart exam reveals a regular rate with a normal S1 and S2.  There are no murmurs, gallops, or rubs.  The PMI is not displaced.   Abdominal exam reveals good bowel sounds.  There is no guarding or rebound.  There is no hepatosplenomegaly or tenderness.  There are no masses.  Exam of the legs reveal no clubbing, cyanosis, or edema.  The legs are without rashes.  The distal pulses are intact.  Cranial nerves II - XII are intact.  Motor and sensory functions are intact.  The gait is normal.  LABORATORY DATA:   Assessment / Plan:

## 2010-11-08 NOTE — Patient Instructions (Signed)
Reduce your metoprolol to 25 mg daily.  If you have recurrent symptoms you could go back to twice a day.  I will see you again in 6 months.

## 2010-11-08 NOTE — Progress Notes (Signed)
Subjective:     Patient ID: [name], female   DOB: Sep 10, 1975, 35 y.o.   MRN: [id number]  HPI Follow up today for left breast/chest wall pain. No new issues.  Mammo and targeted US negative (birads 1).    Review of Systems     Objective:   Physical Exam  Vitals reviewed.      Assessment:   Left chest and breast pain without discrete mass.  Mammo and [place] negative and no significant risks factors for breast cancer.      Plan:     I recommended to her monthly SBE and annual clinical exams with routine mammo screening at age 89.  Follow up sooner if any changes on exam.  I think that it would be okay to proceed with her breast augmentation as scheduled.

## 2010-11-08 NOTE — Assessment & Plan Note (Signed)
She may have a history of SVT but we have no documentation of this in our records. She has been well controlled on beta blocker therapy. I suggested reducing her metoprolol to 25 mg daily. If she does have any recurrent symptoms she may increase it back to twice daily. I will followup again in 6 months.

## 2010-11-14 LAB — COMPREHENSIVE METABOLIC PANEL
Albumin: 3.7
Alkaline Phosphatase: 56
BUN: 3 — ABNORMAL LOW
CO2: 20
Chloride: 104
Glucose, Bld: 105 — ABNORMAL HIGH
Potassium: 3.5
Total Bilirubin: 0.8

## 2010-11-14 LAB — CBC
Hemoglobin: 12.2
MCHC: 35.5
MCHC: 34.8
MCV: 91.9
Platelets: 375
RDW: 13
RDW: 12.9

## 2010-11-14 LAB — URINALYSIS, ROUTINE W REFLEX MICROSCOPIC
Bilirubin Urine: NEGATIVE
Hgb urine dipstick: NEGATIVE
Ketones, ur: 15 — AB
Nitrite: NEGATIVE
Protein, ur: NEGATIVE
Specific Gravity, Urine: 1.015
Urobilinogen, UA: 0.2

## 2010-11-14 LAB — DIFFERENTIAL
Basophils Absolute: 0
Basophils Relative: 0
Eosinophils Relative: 0
Monocytes Absolute: 0.5
Neutro Abs: 17.2 — ABNORMAL HIGH

## 2010-11-14 LAB — POCT I-STAT, CHEM 8
Calcium, Ion: 1.09 — ABNORMAL LOW
Chloride: 105
HCT: 35 — ABNORMAL LOW
TCO2: 18

## 2010-11-14 LAB — T4: T4, Total: 11.4

## 2010-11-14 LAB — POCT CARDIAC MARKERS: Operator id: 270651

## 2010-11-16 ENCOUNTER — Other Ambulatory Visit: Payer: Self-pay | Admitting: Plastic Surgery

## 2010-11-20 LAB — CBC
HCT: 37
Hemoglobin: 12.7
MCHC: 34.2
MCHC: 34.8
MCV: 96.6
MCV: 96.7
Platelets: 264
Platelets: 303
RBC: 4.05
RDW: 13.2
RDW: 13.2
WBC: 11.2 — ABNORMAL HIGH

## 2010-11-20 LAB — COMPREHENSIVE METABOLIC PANEL
ALT: 15
AST: 20
Albumin: 2.6 — ABNORMAL LOW
Alkaline Phosphatase: 120 — ABNORMAL HIGH
Alkaline Phosphatase: 155 — ABNORMAL HIGH
BUN: 5 — ABNORMAL LOW
CO2: 24
Calcium: 10.1
Chloride: 105
GFR calc Af Amer: >60
GFR calc non Af Amer: >60
GFR calc non Af Amer: >60
Glucose, Bld: 91
Potassium: 3.9
Sodium: 137
Total Bilirubin: 0.5
Total Protein: 6.5

## 2010-11-20 LAB — CREATININE CLEARANCE, URINE, 24 HOUR
Creatinine: 0.65
Urine Total Volume-CRCL: 3675

## 2010-11-20 LAB — PROTEIN, URINE, 24 HOUR
Collection Interval-UPROT: 24
Protein, 24H Urine: 74
Protein, Urine: 2

## 2010-11-20 LAB — URINALYSIS, ROUTINE W REFLEX MICROSCOPIC
Bilirubin Urine: NEGATIVE
Glucose, UA: NEGATIVE
Hgb urine dipstick: NEGATIVE
Ketones, ur: 15 — AB
pH: 6.5

## 2010-11-20 LAB — URINALYSIS, DIPSTICK ONLY
Bilirubin Urine: NEGATIVE
Hgb urine dipstick: NEGATIVE
Protein, ur: NEGATIVE
Urobilinogen, UA: 0.2

## 2010-11-20 LAB — DIFFERENTIAL
Basophils Relative: 0
Monocytes Relative: 5
Neutro Abs: 10 — ABNORMAL HIGH
Neutrophils Relative %: 76

## 2010-11-20 LAB — URIC ACID
Uric Acid, Serum: 4.2
Uric Acid, Serum: 5.3

## 2010-11-20 LAB — CCBB MATERNAL DONOR DRAW

## 2010-11-20 LAB — LACTATE DEHYDROGENASE: LDH: 132

## 2010-11-22 LAB — CBC
MCHC: 34.8
RDW: 13

## 2010-11-22 LAB — GC/CHLAMYDIA PROBE AMP, GENITAL
Chlamydia, DNA Probe: NEGATIVE
GC Probe Amp, Genital: NEGATIVE

## 2011-05-30 ENCOUNTER — Encounter: Payer: Self-pay | Admitting: Cardiology

## 2011-05-30 ENCOUNTER — Ambulatory Visit (INDEPENDENT_AMBULATORY_CARE_PROVIDER_SITE_OTHER): Payer: 59 | Admitting: Cardiology

## 2011-05-30 VITALS — BP 118/72 | HR 85 | Ht 66.0 in | Wt 158.0 lb

## 2011-05-30 DIAGNOSIS — I498 Other specified cardiac arrhythmias: Secondary | ICD-10-CM

## 2011-05-30 DIAGNOSIS — R000 Tachycardia, unspecified: Secondary | ICD-10-CM

## 2011-05-30 DIAGNOSIS — I471 Supraventricular tachycardia: Secondary | ICD-10-CM

## 2011-05-30 NOTE — Assessment & Plan Note (Signed)
She is symptomatically doing very well. I recommended continuing on low-dose metoprolol. She is currently not planning on having more children but we did discuss treatment during pregnancy. I'll followup again in one year.

## 2011-05-30 NOTE — Progress Notes (Signed)
   [name] Date of Birth: 1975-10-19   History of Present Illness: [name] is seen today for followup of her palpitations. She has been doing very well. She has had very minimal symptoms of palpitations. He has had some brief episodes that last for seconds. On her last visit we reduced her metoprolol to 25 mg a day and fortunately her symptoms have not increased.  Current Outpatient Prescriptions on File Prior to Visit  Medication Sig Dispense Refill  . ketoprofen (ORUDIS) 75 MG capsule Take 75 mg by mouth 4 (four) times daily as needed.        . metoprolol tartrate (LOPRESSOR) 25 MG tablet Take 1 tablet (25 mg total) by mouth 2 (two) times daily.  60 tablet  5  . sertraline (ZOLOFT) 100 MG tablet Take 100 mg by mouth daily.          Allergies  Allergen Reactions  . Bactrim   . Erythromycin   . Sulfa Drugs Cross Reactors     Past Medical History  Diagnosis Date  . Anxiety   . Depression   . History of miscarriage   . Syncope and collapse   . Incomplete RBBB   . Palpitations   . Tachycardia   . Breast mass     left    Past Surgical History  Procedure Date  . Cholecystectomy   . US echocardiography 06/17/2007    EF 55-60%  . Lipoma excision 2010    left leg    History  Smoking status  . Never Smoker   Smokeless tobacco  . Not on file    History  Alcohol Use No    Family History  Problem Relation Age of Onset  . Hypertension Mother   . Hyperlipidemia Mother   . Heart attack Father     X2  . Crohn's disease Brother     Review of Systems: The review of systems is positive for history of anxiety. All   other systems were reviewed and are negative.  Physical Exam: BP 118/72  Pulse 85  Ht 5\' 6"  (1.676 m)  Wt 158 lb (71.668 kg)  BMI 25.50 kg/m2 The patient is alert and oriented x 3.  The mood and affect are normal.  The skin is warm and dry.  Color is normal.  The HEENT exam  is normal   The carotids are 2+ without bruits.  There is no  thyromegaly.  There is no JVD.  The lungs are clear.   The heart exam reveals a regular rate with a normal S1 and S2.  There are no murmurs, gallops, or rubs.    Exam of the legs reveal no clubbing, cyanosis, or edema.  The legs are without rashes.  The distal pulses are intact.  Cranial nerves II - XII are intact.  Motor and sensory functions are intact.  The gait is normal.  LABORATORY DATA:  ECG today demonstrates normal sinus rhythm with sinus arrhythmia normal EKG.   Assessment / Plan:

## 2011-05-30 NOTE — Patient Instructions (Signed)
Continue your current therapy  I will see you again in 1 year.   

## 2011-06-20 DIAGNOSIS — R519 Headache, unspecified: Secondary | ICD-10-CM | POA: Insufficient documentation

## 2011-06-20 DIAGNOSIS — R51 Headache: Secondary | ICD-10-CM | POA: Insufficient documentation

## 2011-06-21 ENCOUNTER — Encounter: Payer: Self-pay | Admitting: Obstetrics and Gynecology

## 2011-06-21 ENCOUNTER — Ambulatory Visit (INDEPENDENT_AMBULATORY_CARE_PROVIDER_SITE_OTHER): Payer: 59 | Admitting: Obstetrics and Gynecology

## 2011-06-21 VITALS — BP 104/72 | Wt 158.0 lb

## 2011-06-21 DIAGNOSIS — R87610 Atypical squamous cells of undetermined significance on cytologic smear of cervix (ASC-US): Secondary | ICD-10-CM

## 2011-06-21 NOTE — Progress Notes (Signed)
History of previous cervical treatment:  no treatment  Last pap showed:  atypical squamous cellularity of undetermined significance (ASCUS)  Date 11/03/2011  High Risk HPV present: no  Colposcopy results:   Never had one   Gardasil received: no  Offered:  n/a Tobacco use:  no Using Folic Acid: no  REPEAT PAP VISIT  Subjective:    [name] is a 36 y.o. female who presents for a  repeat pap    Objective:    External genitalia: normal Perianal skin: no external genital warts noted Vagina: normal without discharge Cervix: normal in appearance  Assessment and Plan:   Abnormal pap  ASCUS  Pap done Return to the office in 6 months for AEX [facility] AMD 5/2/20139:12 AM

## 2011-06-22 ENCOUNTER — Encounter: Payer: Self-pay | Admitting: Obstetrics and Gynecology

## 2011-06-26 LAB — PAP IG W/ RFLX HPV ASCU

## 2011-07-19 ENCOUNTER — Telehealth: Payer: Self-pay | Admitting: Obstetrics and Gynecology

## 2011-07-19 NOTE — Telephone Encounter (Signed)
LAURA/epic °

## 2011-07-19 NOTE — Telephone Encounter (Signed)
Pt calling for pap results.  Pt notified pap was negative and she should r/c letter from [facility].  Also c/o yeast inf.  Notified pt that she would need to come in for ov.  Pt  Chooses to treat otc at this time.  ld

## 2011-10-19 ENCOUNTER — Other Ambulatory Visit: Payer: Self-pay | Admitting: Cardiology

## 2011-10-19 MED ORDER — METOPROLOL TARTRATE 25 MG PO TABS: 25.0000 mg | ORAL_TABLET | Freq: Two times a day (BID) | ORAL | Status: DC

## 2011-11-15 ENCOUNTER — Ambulatory Visit (INDEPENDENT_AMBULATORY_CARE_PROVIDER_SITE_OTHER): Payer: 59 | Admitting: Obstetrics and Gynecology

## 2011-11-15 ENCOUNTER — Encounter: Payer: Self-pay | Admitting: Obstetrics and Gynecology

## 2011-11-15 VITALS — BP 98/60 | Resp 14 | Ht 65.0 in | Wt 160.0 lb

## 2011-11-15 DIAGNOSIS — Z01419 Encounter for gynecological examination (general) (routine) without abnormal findings: Secondary | ICD-10-CM

## 2011-11-15 DIAGNOSIS — Z124 Encounter for screening for malignant neoplasm of cervix: Secondary | ICD-10-CM

## 2011-11-15 NOTE — Progress Notes (Signed)
The patient reports:no complaints  Contraception: None  Last mammogram: was normal  September 2012 Last pap: was abnormal: ASCUS/ HPV not detected 10/2010    GC/Chlamydia cultures offered: declined HIV/RPR/HbsAg offered:  declined HSV 1 and 2 glycoprotein offered: declined  Menstrual cycle regular and monthly: Yes Menstrual flow normal: Yes  Urinary symptoms: none Normal bowel movements: Yes Reports abuse at home: No  Subjective:    Kyndel Egger is a 36 y.o. female, G2P1011, who presents for an annual exam.     History   Social History  . Marital Status: Married    Spouse Name: N/A    Number of Children: 1  . Years of Education: N/A   Occupational History  .     Social History Main Topics  . Smoking status: Former Smoker    Types: Cigarettes  . Smokeless tobacco: Never Used  . Alcohol Use: Yes  . Drug Use: No  . Sexually Active: Yes    Birth Control/ Protection: None   Other Topics Concern  . None   Social History Narrative  . None    Menstrual cycle:   LMP: Patient's last menstrual period was 11/04/2011.           Cycle: normal  The following portions of the patient's history were reviewed and updated as appropriate: allergies, current medications, past family history, past medical history, past social history, past surgical history and problem list.  Review of Systems Pertinent items are noted in HPI. Breast:Negative for breast lump,nipple discharge or nipple retraction Gastrointestinal: Negative for abdominal pain, change in bowel habits or rectal bleeding Urinary:negative   Objective:    BP 98/60  Resp 14  Ht 5\' 5"  (1.651 m)  Wt 160 lb (72.576 kg)  BMI 26.63 kg/m2  LMP 11/04/2011    Weight:  Wt Readings from Last 1 Encounters:  11/15/11 160 lb (72.576 kg)          BMI: Body mass index is 26.63 kg/(m^2).  General Appearance: Alert, appropriate appearance for age. No acute distress HEENT: Grossly normal Neck / Thyroid: Supple, no masses,  nodes or enlargement Lungs: clear to auscultation bilaterally Back: No CVA tenderness Breast Exam: No masses or nodes.No dimpling, nipple retraction or discharge. Cardiovascular: Regular rate and rhythm. S1, S2, no murmur Gastrointestinal: Soft, non-tender, no masses or organomegaly Pelvic Exam: Vulva and vagina appear normal. Bimanual exam reveals normal uterus and adnexa. Rectovaginal: not indicated Lymphatic Exam: Non-palpable nodes in neck, clavicular, axillary, or inguinal regions  Skin: no rash or abnormalities Neurologic: Normal gait and speech, no tremor  Psychiatric: Alert and oriented, appropriate affect.    Assessment:    Normal gyn exam    Plan:    pap smear return annually or prn STD screening: declined Contraception:condoms      [place] AMD

## 2011-11-19 LAB — PAP IG W/ RFLX HPV ASCU

## 2012-07-04 ENCOUNTER — Encounter: Payer: Self-pay | Admitting: Cardiology

## 2012-08-05 ENCOUNTER — Telehealth: Payer: Self-pay | Admitting: Cardiology

## 2012-08-05 NOTE — Telephone Encounter (Signed)
Returned call to patient she stated she has been having fast heart beat off and on for the past 2 weeks.Stated getting worse.Stated she is at work now and heart has been beating fast.Stated she has been taking metoprolol 25 mg twice a day,no caffeine.Stated would like to be seen today.

## 2012-08-05 NOTE — Telephone Encounter (Signed)
Returned call to patient spoke to Dr.Jordan he advised may take a extra metoprolol 25 mg if needed for fast heart beat.Advised to keep appointment with [name] NP 08/08/12.

## 2012-08-05 NOTE — Telephone Encounter (Signed)
New Problem:    Patient called in wanting to be seen today by anyone.  Patient's states that for the past 2 weeks her heart feels like it's racing and she feels like she is going to pass out at times.  Patient states that today was the worst spell while she was at work.  Patient's states that Dr. Swaziland is aware of this issue and that it may be anxiety.  Please call back.

## 2012-08-08 ENCOUNTER — Ambulatory Visit (INDEPENDENT_AMBULATORY_CARE_PROVIDER_SITE_OTHER): Payer: 59 | Admitting: Nurse Practitioner

## 2012-08-08 ENCOUNTER — Encounter: Payer: Self-pay | Admitting: Nurse Practitioner

## 2012-08-08 VITALS — BP 107/70 | HR 82 | Ht 66.0 in | Wt 158.0 lb

## 2012-08-08 DIAGNOSIS — R002 Palpitations: Secondary | ICD-10-CM

## 2012-08-08 LAB — BASIC METABOLIC PANEL
BUN: 15 mg/dL (ref 6–23)
CO2: 28 mEq/L (ref 19–32)
Calcium: 9.7 mg/dL (ref 8.4–10.5)
Chloride: 102 mEq/L (ref 96–112)
Creatinine, Ser: 0.8 mg/dL (ref 0.4–1.2)
GFR: 83.31 mL/min (ref >60.00)
Glucose, Bld: 92 mg/dL (ref 70–99)
Potassium: 4.6 mEq/L (ref 3.5–5.1)
Sodium: 136 mEq/L (ref 135–145)

## 2012-08-08 LAB — TSH: TSH: 1.25 u[IU]/mL (ref 0.35–5.50)

## 2012-08-08 LAB — MAGNESIUM: Magnesium: 2.2 mg/dL (ref 1.5–2.5)

## 2012-08-08 MED ORDER — METOPROLOL TARTRATE 25 MG PO TABS: 25.0000 mg | ORAL_TABLET | Freq: Every day | ORAL | Status: DC

## 2012-08-08 NOTE — Progress Notes (Addendum)
[name] Date of Birth: 10/02/1975 Medical Record #[id number]  History of Present Illness: [name] is seen back today for a follow up visit. Seen for Dr. Swaziland. Has a history of palpitations/SVT/tachycardia. Maintained on chronic beta blocker therapy. Normal echo from 2009.   Last seen in April of 2013.  Comes in today. Here alone. Had called earlier this week with a recurrent spell of palpitations - has been worse over the past 2 weeks - medicine was adjusted by advising extra dose of beta blocker. She notes that prior she had been doing very well. This just "started out of the blue". She has been more stressed. Just finishing school. Taking care of her 35 year old. Has a tendency to be anxious. Had to get her Ativan refilled and was taking every day. Still on Zoloft but has been on a higher dose in the past. She has not taken any extra metoprolol due to her blood pressure usually being too low.   Current Outpatient Prescriptions  Medication Sig Dispense Refill  . ketoprofen (ORUDIS) 75 MG capsule Take 75 mg by mouth 4 (four) times daily as needed.        Marland Kitchen LORazepam (ATIVAN) 1 MG tablet Take 1 mg by mouth daily as needed for anxiety.      . metoprolol tartrate (LOPRESSOR) 25 MG tablet Take 25 mg by mouth daily.      . sertraline (ZOLOFT) 100 MG tablet Take 100 mg by mouth daily.         No current facility-administered medications for this visit.    Allergies  Allergen Reactions  . Bactrim   . Erythromycin   . Sulfa Drugs Cross Reactors     Past Medical History  Diagnosis Date  . Anxiety   . Depression   . History of miscarriage   . Syncope and collapse   . Incomplete RBBB   . Palpitations   . Tachycardia   . Breast mass     left  . Headache(784.0)     migraines  . Abnormal Pap smear   . GERD (gastroesophageal reflux disease)     Past Surgical History  Procedure Laterality Date  . Cholecystectomy    . US echocardiography  06/17/2007    EF 55-60%  . Lipoma  excision  2010    left leg  . Dilation and curettage of uterus    . Breast enhancement surgery  2012    History  Smoking status  . Former Smoker  . Types: Cigarettes  Smokeless tobacco  . Never Used    History  Alcohol Use  . Yes    Family History  Problem Relation Age of Onset  . Hypertension Mother   . Hyperlipidemia Mother   . Heart disease Mother   . COPD Mother   . Heart attack Father     X2  . Heart disease Father   . Hypertension Father   . Crohn's disease Brother   . COPD Maternal Grandmother   . Diabetes Maternal Grandmother     Review of Systems: The review of systems is per the HPI.  All other systems were reviewed and are negative.  Physical Exam: BP 107/70  Pulse 82  Ht 5\' 6"  (1.676 m)  Wt 158 lb (71.668 kg)  BMI 25.51 kg/m2 Patient is very pleasant and in no acute distress. Skin is warm and dry. Color is normal.  HEENT is unremarkable. Normocephalic/atraumatic. PERRL. Sclera are nonicteric. Neck is supple. No masses. No JVD. Lungs  are clear. Cardiac exam shows a regular rate and rhythm. Abdomen is soft. Extremities are without edema. Gait and ROM are intact. No gross neurologic deficits noted.  LABORATORY DATA: EKG today shows sinus rhythm with RSR' and is otherwise normal.   Labs are pending today.     Chemistry      Component Value Date/Time   NA 137 12/13/2007 1212   K 3.9 12/13/2007 1212   CL 105 12/13/2007 1212   CO2 24 12/13/2007 1212   BUN 4* 12/13/2007 1212   CREATININE 0.60 12/13/2007 1212   CREATININE 0.65 11/15/2007 0114      Component Value Date/Time   CALCIUM 9.5 12/13/2007 1212   ALKPHOS 155* 12/13/2007 1212   AST 20 12/13/2007 1212   ALT 15 12/13/2007 1212   BILITOT 0.5 12/13/2007 1212        Assessment / Plan: 1. Palpitations/SVT - recurrent spells over the past 2 weeks - probably more related to stress - I asked to use extra Metoprolol prn and try to increase her salt use to maintain her BP. If symptoms persist,  would consider a monitor to try and document. Will also check baseline labs today.   2. Depression - she will talk with her PCP about her dose of Zoloft.   Patient is agreeable to this plan and will call if any problems develop in the interim.   Rosalio Macadamia, RN, ANP-C [facility] [address] [place], [place]  [address]   Phone call today on July 2nd.  Has returned from the beach and had 2 spells of palpitations. She is now requesting an echo. I do not think her insurance will pay for this diagnosis and I think an event monitor is in order to try and document. Her spells are not on a daily basis. Still ok to use extra Metoprolol and increase her salt to help with her BP.

## 2012-08-08 NOTE — Patient Instructions (Addendum)
We will check labs today  Stay on same medicines for now - you may use extra Metoprolol but do so with increased salt intake  If your palpitations do not settle down in the next few weeks - call us and we will put a heart monitor on  See Dr. Swaziland in one year.  Talk to your family doctor about your dose of Zoloft  Call the [facility] office at [phone] if you have any questions, problems or concerns.

## 2012-08-09 LAB — CBC WITH DIFFERENTIAL/PLATELET
Basophils Absolute: 0 10*3/uL (ref 0.0–0.1)
Basophils Relative: 0.3 % (ref 0.0–3.0)
Eosinophils Absolute: 0.1 10*3/uL (ref 0.0–0.7)
Eosinophils Relative: 1.1 % (ref 0.0–5.0)
HCT: 42.7 % (ref 36.0–46.0)
Hemoglobin: 14.5 g/dL (ref 12.0–15.0)
Lymphocytes Relative: 26.1 % (ref 12.0–46.0)
Lymphs Abs: 2.5 10*3/uL (ref 0.7–4.0)
MCHC: 34 g/dL (ref 30.0–36.0)
MCV: 95.7 fl (ref 78.0–100.0)
Monocytes Absolute: 0.6 10*3/uL (ref 0.1–1.0)
Monocytes Relative: 5.8 % (ref 3.0–12.0)
Neutro Abs: 6.4 10*3/uL (ref 1.4–7.7)
Neutrophils Relative %: 66.7 % (ref 43.0–77.0)
Platelets: 380 10*3/uL (ref 150.0–400.0)
RBC: 4.46 Mil/uL (ref 3.87–5.11)
RDW: 12.7 % (ref 11.5–14.6)
WBC: 9.6 10*3/uL (ref 4.5–10.5)

## 2012-08-19 ENCOUNTER — Telehealth: Payer: Self-pay | Admitting: Cardiology

## 2012-08-19 NOTE — Telephone Encounter (Signed)
New problem   Pt has a question regarding visit w/lori gerhardt from last week

## 2012-08-19 NOTE — Telephone Encounter (Signed)
Pt had a question regarding visit with [name] last week, lmovm to call back

## 2012-08-20 ENCOUNTER — Telehealth: Payer: Self-pay | Admitting: *Deleted

## 2012-08-20 NOTE — Telephone Encounter (Signed)
S/w pt several times stated went to the beach and had two episodes one got so bad pt walked out of the store stated wanted to get an echo, pt had one in 2005 which was normal. Discussed with Lawson Fiscal and ordered pt a event monitor for 30 days for palpitations and history of svt. Pt wanted something done for peace of mind. Pt stated did Lawson Fiscal think this was coming from her anxiety it is noted pt does have anxiety. I will send this to scheduling so they can make an appointment to get monitor put on.

## 2012-08-20 NOTE — Addendum Note (Signed)
Addended by: Rosalio Macadamia on: 08/20/2012 08:28 AM   Modules accepted: Orders

## 2012-08-25 ENCOUNTER — Telehealth: Payer: Self-pay | Admitting: Cardiology

## 2012-08-26 NOTE — Telephone Encounter (Signed)
Patient called stated she is going to beach next week and would be better if she waits to have monitor put on when she gets back.Schedulers will call back to schedule 30 day event monitor.

## 2012-08-26 NOTE — Telephone Encounter (Signed)
Patient called no answer.LMTC. 

## 2012-09-17 ENCOUNTER — Encounter: Payer: Self-pay | Admitting: *Deleted

## 2012-09-17 ENCOUNTER — Encounter (INDEPENDENT_AMBULATORY_CARE_PROVIDER_SITE_OTHER): Payer: 59

## 2012-09-17 DIAGNOSIS — R002 Palpitations: Secondary | ICD-10-CM

## 2012-09-17 NOTE — Progress Notes (Signed)
Patient ID: [name], female   DOB: [birth year], 37 y.o.   MRN: [id number] E-[employer]amer 30 day cardiac event monitor placed on patient.

## 2012-10-24 ENCOUNTER — Telehealth: Payer: Self-pay

## 2012-10-24 NOTE — Telephone Encounter (Signed)
Patient called Dr.Jordan reviewed event monitor which was normal.Advised she is due to see Dr.Jordan 6/15.Advised to call sooner if needed.

## 2012-11-27 ENCOUNTER — Other Ambulatory Visit: Payer: Self-pay | Admitting: Cardiology

## 2013-05-18 ENCOUNTER — Telehealth: Payer: Self-pay | Admitting: Cardiology

## 2013-05-18 NOTE — Telephone Encounter (Signed)
New message  Pt called states that she is experiencing a rapid heart rate, dizziness and nausiousnes off and on. Pt is requesting a call back to discuss symptoms and if an OV is needed.. Please assist.

## 2013-05-18 NOTE — Telephone Encounter (Signed)
Spoke to patient she stated for the past week palpitations have been more frequent.Stated she has had to take ativan every day.Stated she is off on Wednesdays and would like a Wednesday appointment.Appointment scheduled with Norma FredricksonLori Gerhardt NP Wednesday 05/20/13 at 10:00 am.

## 2013-05-18 NOTE — Telephone Encounter (Signed)
Returned call to patient no answer.LMTC. 

## 2013-05-18 NOTE — Telephone Encounter (Signed)
Follow up ° ° ° ° ° °Returning [name]'s call °

## 2013-05-20 ENCOUNTER — Encounter: Payer: Self-pay | Admitting: Nurse Practitioner

## 2013-05-20 ENCOUNTER — Ambulatory Visit (INDEPENDENT_AMBULATORY_CARE_PROVIDER_SITE_OTHER): Payer: BC Managed Care – PPO | Admitting: Nurse Practitioner

## 2013-05-20 VITALS — BP 130/90 | HR 100 | Ht 66.0 in | Wt 165.8 lb

## 2013-05-20 DIAGNOSIS — R002 Palpitations: Secondary | ICD-10-CM

## 2013-05-20 MED ORDER — METOPROLOL TARTRATE 25 MG PO TABS: ORAL_TABLET | ORAL | Status: DC

## 2013-05-20 NOTE — Patient Instructions (Addendum)
Go back to the metoprolol 12.5mg  two times a day  Keep your follow up for June  Call the [facility] office at [phone] if you have any questions, problems or concerns.

## 2013-05-20 NOTE — Progress Notes (Signed)
[name] Date of Birth: 29-Mar-1975 Medical Record #[id number]  History of Present Illness: [name] is seen back today for a work in visit. Seen for Dr. SwazilandJordan. She is a 38 year old female with a history of palpitations, SVT/tachycardia. Maintained on chronic beta blocker therapy.   Last seen here in June of 2014 - was having more palpitations at that time. Ended up getting an event monitor - this was uneventful. Last echo dates back to 2009 and was normal.  Comes in today. Here alone. Notes that her heart is racing again - has had to use ativan. Has been more anxious and has recently been put on Effexor and Buspar. PCP has weaned her metoprolol. She feels her heart beating fast. Feels dizzy and lightheaded. Does not think her current anxiety medicine is working. Thinking about going back to a counselor. Has had a lot on her plate with mom in nursing home, husband with recent injury at work, etc. Still tries to go to the gym several times a week. No chest pain.   Current Outpatient Prescriptions  Medication Sig Dispense Refill  . busPIRone (BUSPAR) 15 MG tablet Take 15 mg by mouth daily.      Marland Kitchen. ketoprofen (ORUDIS) 75 MG capsule Take 75 mg by mouth 4 (four) times daily as needed.        Marland Kitchen. LORazepam (ATIVAN) 1 MG tablet Take 1 mg by mouth daily as needed for anxiety.      . metoprolol tartrate (LOPRESSOR) 25 MG tablet 12.5 every other day      . venlafaxine (EFFEXOR) 75 MG tablet Take 75 mg by mouth daily.       No current facility-administered medications for this visit.    Allergies  Allergen Reactions  . Bactrim   . Erythromycin   . Sulfa Drugs Cross Reactors     Past Medical History  Diagnosis Date  . Anxiety   . Depression   . History of miscarriage   . Syncope and collapse   . Incomplete RBBB   . Palpitations   . Tachycardia   . Breast mass     left  . Headache(784.0)     migraines  . Abnormal Pap smear   . GERD (gastroesophageal reflux disease)     Past  Surgical History  Procedure Laterality Date  . Cholecystectomy    . [place] echocardiography  06/17/2007    EF 55-60%  . Lipoma excision  2010    left leg  . Dilation and curettage of uterus    . Breast enhancement surgery  2012    History  Smoking status  . Former Smoker  . Types: Cigarettes  Smokeless tobacco  . Never Used    History  Alcohol Use  . Yes    Family History  Problem Relation Age of Onset  . Hypertension Mother   . Hyperlipidemia Mother   . Heart disease Mother   . COPD Mother   . Heart attack Father     X2  . Heart disease Father   . Hypertension Father   . Crohn's disease Brother   . COPD Maternal Grandmother   . Diabetes Maternal Grandmother     Review of Systems: The review of systems is per the HPI.  All other systems were reviewed and are negative.  Physical Exam: Pulse 111  Ht 5\' 6"  (1.676 m)  Wt 165 lb 12.8 oz (75.206 kg)  BMI 26.77 kg/m2  SpO2 100% Patient is very pleasant and in  no acute distress. She has gained 7 pounds. She is anxious. Tearful today. Skin is warm and dry. Color is normal.  HEENT is unremarkable. Normocephalic/atraumatic. PERRL. Sclera are nonicteric. Neck is supple. No masses. No JVD. Lungs are clear. Cardiac exam shows a regular rate and rhythm. Abdomen is soft. Extremities are without edema. Gait and ROM are intact. No gross neurologic deficits noted.  Wt Readings from Last 3 Encounters:  05/20/13 165 lb 12.8 oz (75.206 kg)  08/08/12 158 lb (71.668 kg)  11/15/11 160 lb (72.576 kg)     LABORATORY DATA: EKG today shows sinus tachycardia - rate is 100.   Lab Results  Component Value Date   WBC 9.6 08/08/2012   HGB 14.5 08/08/2012   HCT 42.7 08/08/2012   PLT 380.0 08/08/2012   GLUCOSE 92 08/08/2012   ALT 15 12/13/2007   AST 20 12/13/2007   NA 136 08/08/2012   K 4.6 08/08/2012   CL 102 08/08/2012   CREATININE 0.8 08/08/2012   BUN 15 08/08/2012   CO2 28 08/08/2012   TSH 1.25 08/08/2012     Assessment / Plan: 1.  Palpitations/tachycardia - will increase her Metoprolol back to 12.5 mg BID. Her effexor may be contributing to how she is feeling as well. She is considering seeing psyche for medications. See back in June as planned.   2. Depression/anxiety - would favor counseling/psyche consult. She has seen someone in the past.   Patient is agreeable to this plan and will call if any problems develop in the interim.   Rosalio Macadamia, RN, ANP-C [facility] [address] [place], [place]  [address] [phone]

## 2013-05-21 ENCOUNTER — Telehealth: Payer: Self-pay | Admitting: Nurse Practitioner

## 2013-05-21 ENCOUNTER — Other Ambulatory Visit: Payer: Self-pay

## 2013-05-21 ENCOUNTER — Other Ambulatory Visit: Payer: Self-pay | Admitting: *Deleted

## 2013-05-21 DIAGNOSIS — F419 Anxiety disorder, unspecified: Secondary | ICD-10-CM

## 2013-05-21 NOTE — Telephone Encounter (Signed)
Put referral in the number for [facility] is [phone] and Lawson FiscalLori recommended Dr. Evelene CroonKaur @ [phone] but pt has got to call by herself our office can not call to schedule appt. Will try pt monday

## 2013-05-21 NOTE — Telephone Encounter (Signed)
New message    Patient was seen on yesterday.    Need a referral to see a psychiatry.   The Psychiatry she seen in the past has retired.

## 2013-06-02 NOTE — Telephone Encounter (Signed)
Follow up     Returning Danielle's call regarding an appt she was making for her with another doctor.

## 2013-06-04 ENCOUNTER — Encounter: Payer: Self-pay | Admitting: *Deleted

## 2013-06-04 NOTE — Telephone Encounter (Signed)
Sent letter to pt regarding information about [facility] and Dr. Evelene CroonKaur referral put in

## 2013-06-04 NOTE — Telephone Encounter (Signed)
Follow up  ° ° ° °Returning call back to nurse  °

## 2013-06-08 ENCOUNTER — Telehealth: Payer: Self-pay | Admitting: *Deleted

## 2013-06-08 ENCOUNTER — Telehealth: Payer: Self-pay | Admitting: Nurse Practitioner

## 2013-06-08 ENCOUNTER — Encounter: Payer: Self-pay | Admitting: *Deleted

## 2013-06-08 NOTE — Telephone Encounter (Signed)
S/w pt is going to call physiciatry still stated having palpitations and with family history still think it's related to heart, I told pt Dr. Chilton SiJordon's recommendation's pt stated verbal understanding

## 2013-06-08 NOTE — Telephone Encounter (Signed)
S/w pt is still having the same issues wants you to do testing to rule out pt's heart is ok,  stated this would  Stop pt from  stressing out, [facility]

## 2013-06-08 NOTE — Telephone Encounter (Signed)
New message     Returning your call from last week.

## 2013-06-08 NOTE — Telephone Encounter (Signed)
Hard to say what further testing we could do that would reassure her that her heart is OK. No history of structural heart disease. Normal event monitor last year. I think she needs to get established with Psychiatry. If her symptoms persist despite being under their care we could consider another monitor but I think this would be low yield.  Theron AristaPeter

## 2013-06-08 NOTE — Telephone Encounter (Signed)
Left message on machine for pt to contact the office.   

## 2013-06-08 NOTE — Telephone Encounter (Signed)
Danielle,  Please call Ms. Randie Heinzain with Dr. Elvis CoilJordan's recommendations.

## 2013-06-08 NOTE — Telephone Encounter (Signed)
[name], Will you review and any suggestions??

## 2013-07-30 ENCOUNTER — Telehealth: Payer: Self-pay | Admitting: Nurse Practitioner

## 2013-07-30 NOTE — Telephone Encounter (Signed)
New message     Talk to a nurse regarding a letter she received about a referral to another doctor.

## 2013-07-30 NOTE — Telephone Encounter (Signed)
Patient has called both psychiatry referrals that were given to her.   Soonest appointment is July 17. She is on their cancellation list. Feels that over last several weeks her anxiety has increased and she is even more emotional. Patient denies thoughts of harming herself or others.  Advised that if she does, she can no longer wait for an appointment and would need to be seen urgently. She would like to know if there are any other physicians Lawson Fiscal could recommend with whom she could try to get a sooner appointment.

## 2013-07-31 NOTE — Telephone Encounter (Signed)
S/w pt stated has appointment with [facility] in July and Dr. Evelene CroonKaur in November.  Stated if pt needs us to give us a call.

## 2013-08-14 ENCOUNTER — Ambulatory Visit: Payer: 59 | Admitting: Cardiology

## 2013-11-18 ENCOUNTER — Telehealth: Payer: Self-pay | Admitting: Physician Assistant

## 2013-11-18 ENCOUNTER — Encounter (HOSPITAL_COMMUNITY): Payer: Self-pay | Admitting: Emergency Medicine

## 2013-11-18 ENCOUNTER — Emergency Department (HOSPITAL_COMMUNITY)
Admission: EM | Admit: 2013-11-18 | Discharge: 2013-11-18 | Disposition: A | Discharge status: Home / Self Care | Payer: BC Managed Care – PPO | Attending: Emergency Medicine | Admitting: Emergency Medicine

## 2013-11-18 DIAGNOSIS — F411 Generalized anxiety disorder: Secondary | ICD-10-CM | POA: Insufficient documentation

## 2013-11-18 DIAGNOSIS — G43909 Migraine, unspecified, not intractable, without status migrainosus: Secondary | ICD-10-CM | POA: Insufficient documentation

## 2013-11-18 DIAGNOSIS — R000 Tachycardia, unspecified: Secondary | ICD-10-CM | POA: Diagnosis present

## 2013-11-18 DIAGNOSIS — I498 Other specified cardiac arrhythmias: Secondary | ICD-10-CM | POA: Diagnosis not present

## 2013-11-18 DIAGNOSIS — R112 Nausea with vomiting, unspecified: Secondary | ICD-10-CM | POA: Diagnosis not present

## 2013-11-18 DIAGNOSIS — Z79899 Other long term (current) drug therapy: Secondary | ICD-10-CM | POA: Diagnosis not present

## 2013-11-18 DIAGNOSIS — F329 Major depressive disorder, single episode, unspecified: Secondary | ICD-10-CM | POA: Insufficient documentation

## 2013-11-18 DIAGNOSIS — F3289 Other specified depressive episodes: Secondary | ICD-10-CM | POA: Diagnosis not present

## 2013-11-18 DIAGNOSIS — Z8719 Personal history of other diseases of the digestive system: Secondary | ICD-10-CM | POA: Diagnosis not present

## 2013-11-18 DIAGNOSIS — Z87891 Personal history of nicotine dependence: Secondary | ICD-10-CM | POA: Diagnosis not present

## 2013-11-18 DIAGNOSIS — R209 Unspecified disturbances of skin sensation: Secondary | ICD-10-CM | POA: Diagnosis not present

## 2013-11-18 LAB — BASIC METABOLIC PANEL
ANION GAP: 15 (ref 5–15)
BUN: 11 mg/dL (ref 6–23)
CHLORIDE: 100 meq/L (ref 96–112)
CO2: 21 meq/L (ref 19–32)
Calcium: 9.3 mg/dL (ref 8.4–10.5)
Creatinine, Ser: 0.75 mg/dL (ref 0.50–1.10)
GFR calc Af Amer: >90 mL/min (ref >90)
GFR calc non Af Amer: >90 mL/min (ref >90)
Glucose, Bld: 83 mg/dL (ref 70–99)
Potassium: 4.9 mEq/L (ref 3.7–5.3)
SODIUM: 136 meq/L — AB (ref 137–147)

## 2013-11-18 LAB — I-STAT TROPONIN, ED: Troponin i, poc: 0.01 ng/mL (ref 0.00–0.08)

## 2013-11-18 LAB — CBC WITH DIFFERENTIAL/PLATELET
Basophils Absolute: 0 10*3/uL (ref 0.0–0.1)
Basophils Relative: 0 % (ref 0–1)
Eosinophils Absolute: 0 10*3/uL (ref 0.0–0.7)
Eosinophils Relative: 0 % (ref 0–5)
HCT: 43.7 % (ref 36.0–46.0)
HEMOGLOBIN: 15.6 g/dL — AB (ref 12.0–15.0)
LYMPHS ABS: 1.7 10*3/uL (ref 0.7–4.0)
LYMPHS PCT: 15 % (ref 12–46)
MCH: 32 pg (ref 26.0–34.0)
MCHC: 35.7 g/dL (ref 30.0–36.0)
MCV: 89.7 fL (ref 78.0–100.0)
MONOS PCT: 3 % (ref 3–12)
Monocytes Absolute: 0.4 10*3/uL (ref 0.1–1.0)
NEUTROS ABS: 9.4 10*3/uL — AB (ref 1.7–7.7)
NEUTROS PCT: 82 % — AB (ref 43–77)
Platelets: 354 10*3/uL (ref 150–400)
RBC: 4.87 MIL/uL (ref 3.87–5.11)
RDW: 12.7 % (ref 11.5–15.5)
WBC: 11.5 10*3/uL — ABNORMAL HIGH (ref 4.0–10.5)

## 2013-11-18 MED ORDER — LORAZEPAM 2 MG/ML IJ SOLN
1.0000 mg | Freq: Once | INTRAMUSCULAR | Status: AC
  Administered 2013-11-18: 1 mg via INTRAVENOUS
  Filled 2013-11-18: qty 1

## 2013-11-18 MED ORDER — METOPROLOL TARTRATE 1 MG/ML IV SOLN
10.0000 mg | Freq: Once | INTRAVENOUS | Status: AC
  Administered 2013-11-18: 10 mg via INTRAVENOUS
  Filled 2013-11-18: qty 10

## 2013-11-18 MED ORDER — METOPROLOL TARTRATE 25 MG PO TABS
12.5000 mg | ORAL_TABLET | Freq: Once | ORAL | Status: AC
  Administered 2013-11-18: 12.5 mg via ORAL
  Filled 2013-11-18: qty 1

## 2013-11-18 MED ORDER — SODIUM CHLORIDE 0.9 % IV BOLUS (SEPSIS)
1000.0000 mL | Freq: Once | INTRAVENOUS | Status: AC
  Administered 2013-11-18: 1000 mL via INTRAVENOUS

## 2013-11-18 NOTE — ED Provider Notes (Signed)
Medical screening examination/treatment/procedure(s) were conducted as a shared visit with non-physician practitioner(s) and myself.  I personally evaluated the patient during the encounter.   EKG Interpretation   Date/Time:  Wednesday November 18 2013 09:14:51 EDT Ventricular Rate:  97 PR Interval:  175 QRS Duration: 98 QT Interval:  358 QTC Calculation: 455 R Axis:   68 Text Interpretation:  Sinus rhythm Since last tracing rate slower  Confirmed by YAO  MD, DAVID ([id number]) on 11/18/2013 9:40:58 AM      [name] is a 38 y.o. female hx of SVT here with tachycardia. Woke up this AM with palpitations.Denies chest pain or shortness of breath. Took ativan and felt better. On exam, tachy in 140s. Patient appears anxious. Lungs clear. Legs not swollen. I doubt PE or ACS. Didn't take metoprolol today. Given lopressor, IVF, and ativan and HR dec to 90s. Labs unremarkable, istat K is hemolyzed but BMP showed nl K. I told her to take metoprolol as prescribed and take extra dose when she has palpitations.     Richardean Canalavid H Yao, MD 11/18/13 [phone]

## 2013-11-18 NOTE — ED Provider Notes (Signed)
CSN: [id number]     Arrival date & time 11/18/13  4098 History   First MD Initiated Contact with Patient 11/18/13 [phone]     Chief Complaint  Patient presents with  . Tachycardia  . Numbness     (Consider location/radiation/quality/duration/timing/severity/associated sxs/prior Treatment) HPI Comments: Patient is a 38 year old female with a past medical history anxiety, SVT, GERD, and palpitations who presents with palpitations that woke her from sleep at 6:00am. Patient reports symptoms started suddenly and remained constant since the onset. Patient tried 1mg  PO ativan for her symptoms which provided no relief. Patient reports associated 1 episode of nausea and vomiting. She also reports associated numbness and tingling of fingers and toes. Patient reports taking Lopressor daily for the tachycardia. No aggravating/alleviating factors.    Past Medical History  Diagnosis Date  . Anxiety   . Depression   . History of miscarriage   . Syncope and collapse   . Incomplete RBBB   . Palpitations   . Tachycardia   . Breast mass     left  . Headache(784.0)     migraines  . Abnormal Pap smear   . GERD (gastroesophageal reflux disease)    Past Surgical History  Procedure Laterality Date  . Cholecystectomy    . US echocardiography  06/17/2007    EF 55-60%  . Lipoma excision  2010    left leg  . Dilation and curettage of uterus    . Breast enhancement surgery  2012   Family History  Problem Relation Age of Onset  . Hypertension Mother   . Hyperlipidemia Mother   . Heart disease Mother   . COPD Mother   . Heart attack Father     X2  . Heart disease Father   . Hypertension Father   . Crohn's disease Brother   . COPD Maternal Grandmother   . Diabetes Maternal Grandmother    History  Substance Use Topics  . Smoking status: Former Smoker    Types: Cigarettes  . Smokeless tobacco: Never Used  . Alcohol Use: Yes   OB History   Grav Para Term Preterm Abortions TAB SAB Ect Mult  Living   2 1 1  1 1    1      Review of Systems  Constitutional: Negative for fever, chills and fatigue.  HENT: Negative for trouble swallowing.   Eyes: Negative for visual disturbance.  Respiratory: Negative for shortness of breath.   Cardiovascular: Positive for chest pain and palpitations.  Gastrointestinal: Positive for nausea and vomiting. Negative for abdominal pain and diarrhea.  Genitourinary: Negative for dysuria and difficulty urinating.  Musculoskeletal: Negative for arthralgias and neck pain.  Skin: Negative for color change.  Neurological: Negative for dizziness and weakness.  Psychiatric/Behavioral: Negative for dysphoric mood.      Allergies  Bactrim; Erythromycin; and Sulfa drugs cross reactors  Home Medications   Prior to Admission medications   Medication Sig Start Date End Date Taking? Authorizing Provider  busPIRone (BUSPAR) 15 MG tablet Take 15 mg by mouth daily.    Historical Provider, MD  ketoprofen (ORUDIS) 75 MG capsule Take 75 mg by mouth 4 (four) times daily as needed.      Historical Provider, MD  LORazepam (ATIVAN) 1 MG tablet Take 1 mg by mouth daily as needed for anxiety.    Historical Provider, MD  metoprolol tartrate (LOPRESSOR) 25 MG tablet 12.5 mg BID 05/20/13   Rosalio Macadamia, NP  venlafaxine (EFFEXOR) 75 MG tablet Take 75  mg by mouth daily.    Historical Provider, MD   BP 136/92  Pulse 141  Temp(Src) 97.8 F (36.6 C) (Oral)  Resp 20  SpO2 100%  LMP 11/18/2013 Physical Exam  Nursing note and vitals reviewed. Constitutional: She is oriented to person, place, and time. She appears well-developed and well-nourished. No distress.  HENT:  Head: Normocephalic and atraumatic.  Eyes: Conjunctivae and EOM are normal.  Neck: Normal range of motion.  Cardiovascular: Regular rhythm.  Exam reveals no gallop and no friction rub.   No murmur heard. tachycardic  Pulmonary/Chest: Effort normal and breath sounds normal. She has no wheezes. She has no  rales. She exhibits no tenderness.  Abdominal: Soft. She exhibits no distension. There is no tenderness. There is no rebound and no guarding.  Musculoskeletal: Normal range of motion.  No lower extremity edema or calf tenderness to palpation.   Neurological: She is alert and oriented to person, place, and time. Coordination normal.  Speech is goal-oriented. Moves limbs without ataxia.   Skin: Skin is warm and dry.  Psychiatric: She has a normal mood and affect. Her behavior is normal.    ED Course  Procedures (including critical care time) Labs Review Labs Reviewed  CBC WITH DIFFERENTIAL - Abnormal; Notable for the following:    WBC 11.5 (*)    Hemoglobin 15.6 (*)    Neutrophils Relative % 82 (*)    Neutro Abs 9.4 (*)    All other components within normal limits  BASIC METABOLIC PANEL - Abnormal; Notable for the following:    Sodium 136 (*)    All other components within normal limits  I-STAT CHEM 8, ED - Abnormal; Notable for the following:    Sodium 133 (*)    Potassium 7.0 (*)    Calcium, Ion 1.00 (*)    Hemoglobin 16.3 (*)    HCT 48.0 (*)    All other components within normal limits  I-STAT TROPOININ, ED    Imaging Review No results found.   EKG Interpretation   Date/Time:  Wednesday November 18 2013 08:28:49 EDT Ventricular Rate:  130 PR Interval:  144 QRS Duration: 84 QT Interval:  306 QTC Calculation: 450 R Axis:   82 Text Interpretation:  Sinus tachycardia Right atrial enlargement Cannot  rule out Inferior infarct , age undetermined Anterior infarct , age  undetermined Abnormal ECG Since last tracing rate faster Confirmed by YAO   MD, DAVID ([id number]) on 11/18/2013 8:36:49 AM      MDM   Final diagnoses:  Sinus tachycardia    8:58 AM Labs pending. EKG shows sinus tachycardia. Patient will have fluids and lopressor IV. Patient is currently tachycardic in the 130's with remaining vitals stable.   9:38 AM Patient given IV lopressor and IV ativan which  has improved her heart rate. Labs pending.   Labs initially showed hyperkalemia which was shown to be normal on BMP. Patient will be discharged with Cardiology follow up. Vitals stable and patient afebrile.     Emilia BeckKaitlyn Nakyiah Kuck, PA-C 11/18/13 1447

## 2013-11-18 NOTE — ED Notes (Signed)
Pt presents with tachycardia, numbness in arms and N,V that started this am. Denies any chest pain. Pt hax of same and takes lopressor. sts she took an ativan this am and didn't help. Hx of SVT

## 2013-11-18 NOTE — Telephone Encounter (Signed)
Mrs. [name] is a 38 year old female with past medical history of SVT/tachycardia/palpitation. She has been taking metoprolol 12.5 mg twice a day for tachycardia and was instructed to take an additional 12.5 mg if her heart rate is fast. She usually followup with Dr. Swazil[place]Jordan. Patient woke up this morning feeling palpation, dizziness and nausea. She measured her pulse, and it was 160s. She took one tablet of Ativan, however threw up a few minutes later. Patient is nervous about what is causing her symptoms. She states she has been compliant with her beta blocker treatment. She denies any increased stress recently.  Given her degree of concern, I have advised the patient to [facility] or [facility] . She states her heart rate is now back to normal. However, it is unclear at this time if her symptom is related to hypertension versus tachycardia.   Ramond DialSigned, Avonte Sensabaugh PA Pager: [phone]

## 2013-11-18 NOTE — ED Notes (Signed)
Notified Yao MD of critical i stat result

## 2013-11-18 NOTE — Discharge Instructions (Signed)
Follow up with your Cardiologist as needed. Return to the ED with worsening or concerning symptoms.

## 2013-11-18 NOTE — ED Notes (Signed)
D&C IV 

## 2013-11-19 LAB — I-STAT CHEM 8, ED
BUN: 15 mg/dL (ref 6–23)
CALCIUM ION: 1 mmol/L — AB (ref 1.12–1.23)
CHLORIDE: 104 meq/L (ref 96–112)
Creatinine, Ser: 0.9 mg/dL (ref 0.50–1.10)
Glucose, Bld: 96 mg/dL (ref 70–99)
HCT: 48 % — ABNORMAL HIGH (ref 36.0–46.0)
Hemoglobin: 16.3 g/dL — ABNORMAL HIGH (ref 12.0–15.0)
Potassium: 7 mEq/L (ref 3.7–5.3)
SODIUM: 133 meq/L — AB (ref 137–147)
TCO2: 23 mmol/L (ref 0–100)

## 2013-12-21 ENCOUNTER — Encounter (HOSPITAL_COMMUNITY): Payer: Self-pay | Admitting: Emergency Medicine

## 2014-02-04 ENCOUNTER — Ambulatory Visit (INDEPENDENT_AMBULATORY_CARE_PROVIDER_SITE_OTHER): Payer: BC Managed Care – PPO | Admitting: Cardiology

## 2014-02-04 ENCOUNTER — Encounter: Payer: Self-pay | Admitting: Cardiology

## 2014-02-04 VITALS — BP 115/70 | HR 92 | Ht 66.0 in | Wt 168.8 lb

## 2014-02-04 DIAGNOSIS — R002 Palpitations: Secondary | ICD-10-CM

## 2014-02-04 DIAGNOSIS — R5383 Other fatigue: Secondary | ICD-10-CM

## 2014-02-04 DIAGNOSIS — E785 Hyperlipidemia, unspecified: Secondary | ICD-10-CM

## 2014-02-04 DIAGNOSIS — R079 Chest pain, unspecified: Secondary | ICD-10-CM

## 2014-02-04 NOTE — Progress Notes (Signed)
02/04/2014 [name] HunterMichelle [name]   Jan 07, 1976  [id number]  Primary Physician Ailene RavelHAMRICK,MAURA L, MD Primary Cardiologist: Dr. SwazilandJordan  HPI:  [name] presents to clinic today for evaluation of palpitations. She is a 38 y/o female who has been seen in the past by Dr. SwazilandJordan. Her last office visit was with Norma FredricksonLori Gerhardt, NP, in April of this year.   She has a history of palpitations, SVT/tachycardia and has been on beta blocker therapy with metoprolol. Prior cardiac assessments have included an echocardiogram in 2009 which was a normal study with normal LV function. She has never had a stress test. She reports a strong family history of CAD/sudden cardiac death. Her paternal grandfather died suddenly at the age of 36 ( was told that this was cardiac related/likely MI). Her father also has coronary disease and has suffered 2 myocardial infarctions, the first in his 3140s. She denies any personal history of tobacco abuse. She has no diagnoses of diabetes. She does not recall undergoing fasting laboratory work for lipid studies in the past. She also admits that she has a history of anxiety. This was made more pronounced 8 years ago after she and her husband suffered the loss of a child. She is followed by a psychiatrist, has undergone counseling and has also been on medications for anxiety/depression.  Her main concern today is increased frequency of palpitations. She is concerned that this may not all be anxiety related and she presents for evaluation for assessment to rule out cardiac etiologies, given her strong family history of premature cardiovascular disease.  Over the last several months she reports frequent episodes of palpitations that occur several times per week. Episodes often lasts several minutes. She notes associated chest discomfort (often pressure-like), dyspnea, diaphoresis, dizziness, lightheadedness and near syncope. She denies any recent frank syncope, although she notes that she did have a  syncopal episode years ago. She denies any chest pain in the absence of palpitations. Recently, she had an episode that prompted her to seek emergency evaluation. [facility]18/[facility]. Per records, she was noted to be tachycardic with a heart rate in the 140s. Her labs were unremarkable. She was apparently given IV Lopressor, IV fluids and Ativan and her heart rate improved into the 90s. She was discharged directly from the ED and did not require admission. Since that time, she has continued to have frequent symptoms.  She reports that she has been fully compliant with her medications including metoprolol. However, she notes that this was cut back years ago due to symptomatic hypotension. She is on short-acting Lopressor (25 mg) but only takes this once a day. She denies any excessive caffeine intake. She notes that she is a social drinker and denies regular use of alcohol and no excessive binge drinking. She denies any recreational drug use. No known history of thyroid disorders.    Current Outpatient Prescriptions  Medication Sig Dispense Refill  . ketoprofen (ORUDIS) 75 MG capsule Take 75 mg by mouth 4 (four) times daily as needed (for migraines).     . LORazepam (ATIVAN) 1 MG tablet Take 1 mg by mouth daily as needed for anxiety.    . metoprolol tartrate (LOPRESSOR) 25 MG tablet Take 12.5 mg by mouth 2 (two) times daily. Take 25 MG in the morning and 12.5 MG in the evening.    . sertraline (ZOLOFT) 100 MG tablet Take 0.5 tablets by mouth daily. Will increase to full dose tomorrow (02/05/2014)  0  No current facility-administered medications for this visit.    Allergies  Allergen Reactions  . Bactrim Nausea And Vomiting  . Erythromycin Other (See Comments)    Childhood reaction  . Sulfa Drugs Cross Reactors Nausea And Vomiting    History   Social History  . Marital Status: Married    Spouse Name: N/A    Number of Children: 1  . Years of  Education: N/A   Occupational History  .     Social History Main Topics  . Smoking status: Former Smoker    Types: Cigarettes  . Smokeless tobacco: Never Used  . Alcohol Use: Yes  . Drug Use: No  . Sexual Activity: Yes    Birth Control/ Protection: None   Other Topics Concern  . Not on file   Social History Narrative     Review of Systems: General: negative for chills, fever, night sweats or weight changes.  Cardiovascular: negative for chest pain, dyspnea on exertion, edema, orthopnea, palpitations, paroxysmal nocturnal dyspnea or shortness of breath Dermatological: negative for rash Respiratory: negative for cough or wheezing Urologic: negative for hematuria Abdominal: negative for nausea, vomiting, diarrhea, bright red blood per rectum, melena, or hematemesis Neurologic: negative for visual changes, syncope, or dizziness All other systems reviewed and are otherwise negative except as noted above.    Blood pressure 115/70, pulse 92, height 5\' 6"  (1.676 m), weight 168 lb 12.8 oz (76.567 kg).  General appearance: alert, cooperative and no distress Neck: no carotid bruit and no JVD Lungs: clear to auscultation bilaterally Heart: regular rate and rhythm, S1, S2 normal, no murmur, click, rub or gallop Extremities: no LEE Pulses: 2+ and symmetric Skin: warm and dry Neurologic: Grossly normal  EKG NSR. HR 92 bpm  ASSESSMENT AND PLAN:   1. Palpitations: prior cardiac office notes report history of SVT. The patient's recent symptoms are concerning for recurrent arrhythmias. We'll place her on a 30 day heart monitor to evaluate whether or not this is truly SVT. If she is having recurrent SVT, we will refer her to EP for consideration for SVT ablation. Given her associated chest discomfort as well as strong family history of early CAD, we will also have her undergo exercise stress testing (POET). Her last 2-D echocardiogram was 6 years ago. We will re-evaluate to see if there has  been any structural changes. Will also check a TSH to r/o thyroid disorders. I have also instructed her to attempt to increase her metoprolol back to twice a day but at a lower dose. I recommended a trial of 12.5 mg in the evenings. She has been instructed to return back to once a day dosing if she develops any symptomatic hypotension. I also reinforced avoidance of caffeine and alcohol.   2. Screening for additional cardiac risk factors: patient reports that she has never undergone fasting laboratory work for screening of hyperlipidemia. She is concerned given her strong family history of early CAD. With her strong family history and fact that she is over the age of 45 with no previous assessment, I recommend that we check a fasting lipid panel. If normal, would recommend that she only undergo repeat evaluation every 3-5 years.   PLAN  The patient already has a follow-up appointment scheduled to see Dr. Rennis Golden 02/24/14. We will plan for her to undergo stress testing and a 2-D echocardiogram as well as laboratory work done prior to this day so that the results can be reviewed at time of appointment. The patient is in  agreement to this plan  Sharol HarnessSIMMONS, [facility] HealthcareBRITTAINYPA-C 02/04/2014 6:27 PM

## 2014-02-04 NOTE — Patient Instructions (Addendum)
Your physician has ordered for you to have a series of exams and some blood work done.  Your blood work needs to be completed with you FASTING (no food or drink).  INCREASE your Metoprolol Tartrate to 25 MG (2 pills) in the morning and 12.5 MG (1 pill) in the evening.   Your tests include:  Your physician has requested that you have an exercise stress myoview. **PLEASE HOLD YOUR METOPROLOL TARTRATE (LOPRESSOR) THE MORNING OF THIS TEST** For further information please visit [email or web address]. Please follow instruction sheet, as given.  Echocardiogram. Echocardiography is a painless test that uses sound waves to create images of your heart. It provides your doctor with information about the size and shape of your heart and how well your heart's chambers and valves are working. This procedure takes approximately one hour. There are no restrictions for this procedure.  Your Doctor has ordered you to wear a heart monitor. You will wear this for 30 days.   TIPS -  REMINDERS 1. The sensor is the lanyard that is worn around your neck every day - this is powered by a battery that needs to be changed every day 2. The monitor is the device that allows you to record symptoms - this will need to be charged daily 3. The sensor & monitor need to be within 100 feet of each other at all times 4. The sensor connects to the electrodes (stickers) - these should be changed every 24-48 hours (you do not have to remove them when you bathe, just make sure they are dry when you connect it back to the sensor 5. If you need more supplies (electrodes, batteries), please call the 1-800 # on the back of the pamphlet and [employer] will mail you more supplies 6. If your skin becomes sensitive, please try the sample pack of sensitive skin electrodes (the white packet in your silver box) and call [employer] to have them mail you more of these type of electrodes 7. When you are finish wearing the monitor, please place all  supplies back in the silver box, place the silver box in the pre-packaged UPS bag and drop off at [employer] or call them so they can come pick it up          Cardiac Event Monitoring A cardiac event monitor is a small recording device used to help detect abnormal heart rhythms (arrhythmias). The monitor is used to record heart rhythm when noticeable symptoms such as the following occur:  Fast heartbeats (palpitations), such as heart racing or fluttering.  Dizziness.  Fainting or light-headedness.  Unexplained weakness. The monitor is wired to two electrodes placed on your chest. Electrodes are flat, sticky disks that attach to your skin. The monitor can be worn for up to 30 days. You will wear the monitor at all times, except when bathing.  HOW TO USE YOUR CARDIAC EVENT MONITOR A technician will prepare your chest for the electrode placement. The technician will show you how to place the electrodes, how to work the monitor, and how to replace the batteries. Take time to practice using the monitor before you leave the office. Make sure you understand how to send the information from the monitor to your health care provider. This requires a telephone with a landline, not a cell phone. You need to:  Wear your monitor at all times, except when you are in water:  Do not get the monitor wet.  Take the monitor off when bathing. Do not swim or use  a hot tub with it on.  Keep your skin clean. Do not put body lotion or moisturizer on your chest.  Change the electrodes daily or any time they stop sticking to your skin. You might need to use tape to keep them on.  It is possible that your skin under the electrodes could become irritated. To keep this from happening, try to put the electrodes in slightly different places on your chest. However, they must remain in the area under your left breast and in the upper right section of your chest.  Make sure the monitor is safely clipped to your clothing  or in a location close to your body that your health care provider recommends.  Press the button to record when you feel symptoms of heart trouble, such as dizziness, weakness, light-headedness, palpitations, thumping, shortness of breath, unexplained weakness, or a fluttering or racing heart. The monitor is always on and records what happened slightly before you pressed the button, so do not worry about being too late to get good information.  Keep a diary of your activities, such as walking, doing chores, and taking medicine. It is especially important to note what you were doing when you pushed the button to record your symptoms. This will help your health care provider determine what might be contributing to your symptoms. The information stored in your monitor will be reviewed by your health care provider alongside your diary entries.  Send the recorded information as recommended by your health care provider. It is important to understand that it will take some time for your health care provider to process the results.  Change the batteries as recommended by your health care provider. SEEK IMMEDIATE MEDICAL CARE IF:   You have chest pain.  You have extreme difficulty breathing or shortness of breath.  You develop a very fast heartbeat that persists.  You develop dizziness that does not go away.  You faint or constantly feel you are about to faint. Document Released: 11/15/2007 Document Revised: 06/22/2013 Document Reviewed: 08/04/2012 [facility] Patient Information 2015 [place]. This information is not intended to replace advice given to you by your health care provider. Make sure you discuss any questions you have with your health care provider.

## 2014-02-05 ENCOUNTER — Telehealth (HOSPITAL_COMMUNITY): Payer: Self-pay

## 2014-02-05 LAB — LIPID PANEL
Cholesterol: 134 mg/dL (ref 0–200)
HDL: 47 mg/dL (ref >39)
LDL Cholesterol: 61 mg/dL (ref 0–99)
TRIGLYCERIDES: 131 mg/dL (ref <150)
Total CHOL/HDL Ratio: 2.9 Ratio
VLDL: 26 mg/dL (ref 0–40)

## 2014-02-05 LAB — TSH: TSH: 1.897 u[IU]/mL (ref 0.350–4.500)

## 2014-02-05 NOTE — Telephone Encounter (Signed)
Encounter complete. 

## 2014-02-10 ENCOUNTER — Ambulatory Visit (HOSPITAL_BASED_OUTPATIENT_CLINIC_OR_DEPARTMENT_OTHER)
Admission: RE | Admit: 2014-02-10 | Discharge: 2014-02-10 | Disposition: A | Discharge status: Home / Self Care | Payer: BC Managed Care – PPO | Source: Ambulatory Visit | Attending: Cardiology | Admitting: Cardiology

## 2014-02-10 ENCOUNTER — Telehealth: Payer: Self-pay | Admitting: *Deleted

## 2014-02-10 ENCOUNTER — Ambulatory Visit (HOSPITAL_COMMUNITY)
Admission: RE | Admit: 2014-02-10 | Discharge: 2014-02-10 | Disposition: A | Discharge status: Home / Self Care | Payer: BC Managed Care – PPO | Source: Ambulatory Visit | Attending: Cardiology | Admitting: Cardiology

## 2014-02-10 ENCOUNTER — Encounter: Payer: Self-pay | Admitting: *Deleted

## 2014-02-10 DIAGNOSIS — R079 Chest pain, unspecified: Secondary | ICD-10-CM

## 2014-02-10 DIAGNOSIS — Z8249 Family history of ischemic heart disease and other diseases of the circulatory system: Secondary | ICD-10-CM | POA: Diagnosis not present

## 2014-02-10 DIAGNOSIS — R0602 Shortness of breath: Secondary | ICD-10-CM | POA: Insufficient documentation

## 2014-02-10 DIAGNOSIS — R002 Palpitations: Secondary | ICD-10-CM | POA: Insufficient documentation

## 2014-02-10 DIAGNOSIS — E785 Hyperlipidemia, unspecified: Secondary | ICD-10-CM | POA: Diagnosis not present

## 2014-02-10 MED ORDER — TECHNETIUM TC 99M SESTAMIBI GENERIC - CARDIOLITE
30.0000 | Freq: Once | INTRAVENOUS | Status: AC | PRN
  Administered 2014-02-10: 30 via INTRAVENOUS

## 2014-02-10 MED ORDER — TECHNETIUM TC 99M SESTAMIBI GENERIC - CARDIOLITE
10.0000 | Freq: Once | INTRAVENOUS | Status: AC | PRN
  Administered 2014-02-10: 10 via INTRAVENOUS

## 2014-02-10 MED ORDER — TECHNETIUM TC 99M SESTAMIBI GENERIC - CARDIOLITE: 30.0000 | Freq: Once | INTRAVENOUS | Status: AC | PRN

## 2014-02-10 MED ORDER — TECHNETIUM TC 99M SESTAMIBI GENERIC - CARDIOLITE: 10.0000 | Freq: Once | INTRAVENOUS | Status: AC | PRN

## 2014-02-10 NOTE — Progress Notes (Signed)
2D Echo Performed 02/10/2014    [name], RCS  

## 2014-02-10 NOTE — Telephone Encounter (Signed)
-----   Message from Allayne ButcherBrittainy M Simmons, [place]PA-C sent at 02/09/2014  5:52 PM EST ----- Thyroid function normal. Lipid panel also normal. No need for medication.

## 2014-02-10 NOTE — Procedures (Addendum)
[facility] [address] [place] [address] [phone]  Cardiology Nuclear Med Study  [name] HunterMichelle [name] is a 38 y.o. female     MRN : [id number]     DOB: Jun 20, 1975  Procedure Date: 02/10/2014  Nuclear Med Background Indication for Stress Test:  Post Hospital History:  Hx of Tachycardia;No prior NUC MPI for comparison;No priror respiratory history reported. Cardiac Risk Factors: Family History - CAD and Lipids  Symptoms:  Chest Pain, Palpitations and SOB   Nuclear Pre-Procedure Caffeine/Decaff Intake:  7:00pm NPO After: 5:00am   IV Site: R Forearm  IV 0.9% NS with Angio Cath:  22g  Chest Size (in):  n/a IV Started by: Berdie OgrenAmanda Wease, RN  Height: 5\' 6"  (1.676 m)  Cup Size: DD;Pt has had bilateral breast imploants  BMI:  Body mass index is 27.13 kg/(m^2). Weight:  168 lb (76.204 kg)   Tech Comments:  n/a    Nuclear Med Study 1 or 2 day study: 1 day  Stress Test Type:  Stress  Order Authorizing Provider:  Zoila ShutterKenneth HIlty, MD   Resting Radionuclide: Technetium 9680m Sestamibi  Resting Radionuclide Dose: 10.7 mCi   Stress Radionuclide:  Technetium 7480m Sestamibi  Stress Radionuclide Dose: 31.9 mCi           Stress Protocol Rest HR: 96 Stress HR: 184  Rest BP: 141/98 Stress BP:178/95  Exercise Time (min): 5:41 METS: 7.00   Predicted Max HR: 184 bpm % Max HR: 100 bpm Rate Pressure Product: [id number]  Dose of Adenosine (mg):  n/a Dose of Lexiscan: n/a mg  Dose of Atropine (mg): n/a Dose of Dobutamine: n/a mcg/kg/min (at max HR)  Stress Test Technologist: Ernestene MentionGwen Farrington, CCT Nuclear Technologist: Koren Shiverobin Moffitt, CNMT   Rest Procedure:  Myocardial perfusion imaging was performed at rest 45 minutes following the intravenous administration of Technetium 5180m Sestamibi. Stress Procedure:  The patient performed treadmill exercise using a Bruce  Protocol for 5 minutes 41 seconds. The patient stopped due to fatigue and shortness of  breath.  Patient denied any chest pain.  There were significant ST-T wave changes.  Technetium 3680m Sestamibi was injected IV at peak exercise and myocardial perfusion imaging was performed after a brief delay.  Transient Ischemic Dilatation (Normal <1.22):  1.33 QGS EDV:  46 ml QGS ESV:  7 ml LV Ejection Fraction: 84%    Rest ECG: NSR, RVCD, nonspecific ST changes.  Stress ECG: Insignificant upsloping ST segment depression.  QPS Raw Data Images:  Acquisition technically good; normal left ventricular size. Stress Images:  Normal homogeneous uptake in all areas of the myocardium. Rest Images:  Normal homogeneous uptake in all areas of the myocardium. Subtraction (SDS):  No evidence of ischemia.  Impression Exercise Capacity:  Fair exercise capacity. BP Response:  Normal blood pressure response. Clinical Symptoms:  There is dyspnea. ECG Impression:  Insignificant upsloping ST segment depression. Comparison with Prior Nuclear Study: No previous nuclear study performed  Overall Impression:  Normal stress nuclear study.  LV Wall Motion:  NL LV Function; NL Wall Motion   Olga MillersBrian Crenshaw, MD  02/10/2014 2:01 PM

## 2014-02-10 NOTE — Telephone Encounter (Signed)
Letter mailed with results. 

## 2014-02-24 ENCOUNTER — Ambulatory Visit (INDEPENDENT_AMBULATORY_CARE_PROVIDER_SITE_OTHER): Payer: BLUE CROSS/BLUE SHIELD | Admitting: Internal Medicine

## 2014-02-24 ENCOUNTER — Encounter: Payer: Self-pay | Admitting: Internal Medicine

## 2014-02-24 VITALS — BP 128/80 | HR 82 | Ht 66.0 in | Wt 172.7 lb

## 2014-02-24 DIAGNOSIS — I319 Disease of pericardium, unspecified: Secondary | ICD-10-CM

## 2014-02-24 DIAGNOSIS — I313 Pericardial effusion (noninflammatory): Secondary | ICD-10-CM

## 2014-02-24 DIAGNOSIS — R000 Tachycardia, unspecified: Secondary | ICD-10-CM

## 2014-02-24 DIAGNOSIS — I3139 Other pericardial effusion (noninflammatory): Secondary | ICD-10-CM

## 2014-02-24 DIAGNOSIS — R002 Palpitations: Secondary | ICD-10-CM

## 2014-02-24 DIAGNOSIS — F419 Anxiety disorder, unspecified: Secondary | ICD-10-CM

## 2014-02-24 LAB — C-REACTIVE PROTEIN: CRP: <0.5 mg/dL (ref <0.60)

## 2014-02-24 NOTE — Progress Notes (Signed)
02/24/2014 [name]   [birth year]  [id number]  Primary Physician Ailene Ravel, MD Primary Cardiologist: Dr. Swaziland  HPI:  [name] presents to clinic today for evaluation of palpitations. She is a 39 y/o female who has been seen in the past by Dr. Swaziland. Her last office visit was with [name], NP, in April of this year.   She has a history of palpitations, SVT/tachycardia and has been on beta blocker therapy with metoprolol. Prior cardiac assessments have included an echocardiogram in 2009 which was a normal study with normal LV function. She has never had a stress test. She reports a strong family history of CAD/sudden cardiac death. Her paternal grandfather died suddenly at the age of 55 ( was told that this was cardiac related/likely MI). Her father also has coronary disease and has suffered 2 myocardial infarctions, the first in his 80s. She denies any personal history of tobacco abuse. She has no diagnoses of diabetes. She does not recall undergoing fasting laboratory work for lipid studies in the past. She also admits that she has a history of anxiety. This was made more pronounced 8 years ago after she and her husband suffered the loss of a child. She is followed by a psychiatrist, has undergone counseling and has also been on medications for anxiety/depression.  Her main concern today is increased frequency of palpitations. She is concerned that this may not all be anxiety related and she presents for evaluation for assessment to rule out cardiac etiologies, given her strong family history of premature cardiovascular disease.  Over the last several months she reports frequent episodes of palpitations that occur several times per week. Episodes often lasts several minutes. She notes associated chest discomfort (often pressure-like), dyspnea, diaphoresis, dizziness, lightheadedness and near syncope. She denies any recent frank syncope, although she notes that she did have a  syncopal episode years ago. She denies any chest pain in the absence of palpitations. Recently, she had an episode that prompted her to seek emergency evaluation. She was seen in the [facility] emergency department 11/18/2013. Per records, she was noted to be tachycardic with a heart rate in the 140s. Her labs were unremarkable. She was apparently given IV Lopressor, IV fluids and Ativan and her heart rate improved into the 90s. She was discharged directly from the ED and did not require admission. Since that time, she has continued to have frequent symptoms.  She reports that she has been fully compliant with her medications including metoprolol. However, she notes that this was cut back years ago due to symptomatic hypotension. She is on short-acting Lopressor (25 mg) but only takes this once a day. She denies any excessive caffeine intake. She notes that she is a social drinker and denies regular use of alcohol and no excessive binge drinking. She denies any recreational drug use. No known history of thyroid disorders.  [name] returns today for follow-up. She had been continuing to have palpitations. She underwent a nuclear stress test which was negative for ischemia. An echocardiogram showed no significant abnormalities other than a small pericardial effusion. Is not clear as to what the cause of this is. She is currently wearing a CardioNet monitor which is scheduled for January 15. She is ready had auto transmissions on December 17 and December 18 showing sinus rhythm and sinus tachycardia with a rate of 137. On December 20 she had 3 transmission showing sinus tachycardia in the 130s with symptoms of heart racing. This was described during  light activities. On January 1 she had an episode of heart racing with heart rate up to 154 doing light activity. She did feel palpitations at the time.  Current Outpatient Prescriptions  Medication Sig Dispense Refill  . ketoprofen (ORUDIS) 75 MG capsule Take 75  mg by mouth 4 (four) times daily as needed (for migraines).     . LORazepam (ATIVAN) 1 MG tablet Take 1 mg by mouth daily as needed for anxiety.    . metoprolol tartrate (LOPRESSOR) 25 MG tablet Take 12.5 mg by mouth 2 (two) times daily. Take 25 MG in the morning and 12.5 MG in the evening.    . sertraline (ZOLOFT) 100 MG tablet Take 0.5 tablets by mouth daily. Will increase to full dose tomorrow (02/05/2014)  0   No current facility-administered medications for this visit.    Allergies  Allergen Reactions  . Bactrim Nausea And Vomiting  . Erythromycin Other (See Comments)    Childhood reaction  . Sulfa Drugs Cross Reactors Nausea And Vomiting    History   Social History  . Marital Status: Married    Spouse Name: N/A    Number of Children: 1  . Years of Education: N/A   Occupational History  .     Social History Main Topics  . Smoking status: Former Smoker    Types: Cigarettes  . Smokeless tobacco: Never Used  . Alcohol Use: Yes  . Drug Use: No  . Sexual Activity: Yes    Birth Control/ Protection: None   Other Topics Concern  . Not on file   Social History Narrative     Review of Systems: General: negative for chills, fever, night sweats or weight changes.  Cardiovascular: negative for chest pain, dyspnea on exertion, edema, orthopnea, palpitations, paroxysmal nocturnal dyspnea or shortness of breath Dermatological: negative for rash Respiratory: negative for cough or wheezing Urologic: negative for hematuria Abdominal: negative for nausea, vomiting, diarrhea, bright red blood per rectum, melena, or hematemesis Neurologic: negative for visual changes, syncope, or dizziness All other systems reviewed and are otherwise negative except as noted above.    Blood pressure 128/80, pulse 82, height 5\' 6"  (1.676 m), weight 172 lb 11.2 oz (78.336 kg).  deferred  EKG deferred  ASSESSMENT AND PLAN:   1. Palpitations: prior cardiac office notes report history of SVT.  At this point the monitor is failing to show any SVT, rather sinus tachycardia which may be inappropriate. She is on increasing doses of beta blocker to encourage her to continue on those doses. Recent adjustments in her antianxiety medications have been made as well as putting her back on Zoloft. She was previously on Effexor. Hopefully this change will help with her tachycardia as well. We may need to continue to up titrate her beta blocker as blood pressure will tolerate. He also has a history of migraine headaches, and switching to a nonselective beta blocker such as propranolol may be even more helpful. I do not see any clear bypass type arrhythmia that would be amenable to ablation.   2.  Pericardial effusion: Is unusual that she has a small pericardial effusion. The etiology is unclear. She denies any chest pain or shortness of breath. I would recommend checking an ANA, sedimentation rate and CRP for initial screening exam. If there is some evidence for active pericardial inflammation, she may need to be treated with colchicine, NSAIDs or both.  Chrystie NoseKenneth C. Riannon Mukherjee, MD, [facility] Attending Cardiologist [facility]  Prather Failla C 02/24/2014 6:12 PM

## 2014-02-24 NOTE — Patient Instructions (Signed)
Your physician recommends that you return for lab work in: TODAY - first floor, first hallway on right off elevator, [address]  Your physician recommends that you schedule a follow-up appointment in: 1 month with Dr. Rennis GoldenHilty.

## 2014-02-25 ENCOUNTER — Telehealth: Payer: Self-pay | Admitting: Internal Medicine

## 2014-02-25 LAB — SEDIMENTATION RATE: Sed Rate: 4 mm/hr (ref 0–22)

## 2014-02-25 LAB — ANA: ANA: NEGATIVE

## 2014-02-25 NOTE — Telephone Encounter (Signed)
Pt called in stating that she had some blood work done today and she would like to know if the results were in yet. Please call  Thanks

## 2014-02-25 NOTE — Telephone Encounter (Signed)
Spoke with pt, aware results are not available yet. 

## 2014-02-26 ENCOUNTER — Telehealth: Payer: Self-pay | Admitting: Internal Medicine

## 2014-02-26 NOTE — Telephone Encounter (Signed)
Spoke w/ patient, lab results discussed, no further questions.

## 2014-02-26 NOTE — Telephone Encounter (Signed)
LMTCB

## 2014-02-26 NOTE — Telephone Encounter (Signed)
Calling about her lab results .Marland Kitchen. Please call    Thanks

## 2014-03-10 ENCOUNTER — Telehealth: Payer: Self-pay | Admitting: Internal Medicine

## 2014-03-10 NOTE — Telephone Encounter (Signed)
She called back and I verified her appt date and time

## 2014-03-15 ENCOUNTER — Telehealth: Payer: Self-pay | Admitting: Internal Medicine

## 2014-03-15 ENCOUNTER — Encounter: Payer: Self-pay | Admitting: Cardiovascular Disease

## 2014-03-15 NOTE — Telephone Encounter (Signed)
Close encounter 

## 2014-03-17 ENCOUNTER — Encounter: Payer: Self-pay | Admitting: Internal Medicine

## 2014-03-17 ENCOUNTER — Ambulatory Visit (INDEPENDENT_AMBULATORY_CARE_PROVIDER_SITE_OTHER): Payer: BLUE CROSS/BLUE SHIELD | Admitting: Internal Medicine

## 2014-03-17 VITALS — BP 102/72 | HR 84 | Ht 66.0 in | Wt 169.3 lb

## 2014-03-17 DIAGNOSIS — I471 Supraventricular tachycardia: Secondary | ICD-10-CM

## 2014-03-17 DIAGNOSIS — I319 Disease of pericardium, unspecified: Secondary | ICD-10-CM

## 2014-03-17 DIAGNOSIS — F419 Anxiety disorder, unspecified: Secondary | ICD-10-CM

## 2014-03-17 DIAGNOSIS — R000 Tachycardia, unspecified: Secondary | ICD-10-CM

## 2014-03-17 DIAGNOSIS — I313 Pericardial effusion (noninflammatory): Secondary | ICD-10-CM

## 2014-03-17 DIAGNOSIS — R002 Palpitations: Secondary | ICD-10-CM

## 2014-03-17 DIAGNOSIS — I3139 Other pericardial effusion (noninflammatory): Secondary | ICD-10-CM | POA: Insufficient documentation

## 2014-03-17 NOTE — Progress Notes (Signed)
03/17/2014 Hillery HunterMichelle Coen   [birth year]  [id number]  Primary Physician Ailene RavelHAMRICK,MAURA L, MD Primary Cardiologist: Dr. SwazilandJordan  HPI:  [name] presents to clinic today for evaluation of palpitations. She is a 39 y/o female who has been seen in the past by Dr. SwazilandJordan. Her last office visit was with Norma FredricksonLori Gerhardt, NP, in April of this year.   She has a history of palpitations, SVT/tachycardia and has been on beta blocker therapy with metoprolol. Prior cardiac assessments have included an echocardiogram in 2009 which was a normal study with normal LV function. She has never had a stress test. She reports a strong family history of CAD/sudden cardiac death. Her paternal grandfather died suddenly at the age of 10337 ( was told that this was cardiac related/likely MI). Her father also has coronary disease and has suffered 2 myocardial infarctions, the first in his 6440s. She denies any personal history of tobacco abuse. She has no diagnoses of diabetes. She does not recall undergoing fasting laboratory work for lipid studies in the past. She also admits that she has a history of anxiety. This was made more pronounced 8 years ago after she and her husband suffered the loss of a child. She is followed by a psychiatrist, has undergone counseling and has also been on medications for anxiety/depression.  Her main concern today is increased frequency of palpitations. She is concerned that this may not all be anxiety related and she presents for evaluation for assessment to rule out cardiac etiologies, given her strong family history of premature cardiovascular disease.  Over the last several months she reports frequent episodes of palpitations that occur several times per week. Episodes often lasts several minutes. She notes associated chest discomfort (often pressure-like), dyspnea, diaphoresis, dizziness, lightheadedness and near syncope. She denies any recent frank syncope, although she notes that she did have a  syncopal episode years ago. She denies any chest pain in the absence of palpitations. Recently, she had an episode that prompted her to seek emergency evaluation. [facility]. Per records, she was noted to be tachycardic with a heart rate in the 140s. Her labs were unremarkable. She was apparently given IV Lopressor, IV fluids and Ativan and her heart rate improved into the 90s. She was discharged directly from the ED and did not require admission. Since that time, she has continued to have frequent symptoms.  She reports that she has been fully compliant with her medications including metoprolol. However, she notes that this was cut back years ago due to symptomatic hypotension. She is on short-acting Lopressor (25 mg) but only takes this once a day. She denies any excessive caffeine intake. She notes that she is a social drinker and denies regular use of alcohol and no excessive binge drinking. She denies any recreational drug use. No known history of thyroid disorders.  [name] returns today for follow-up. She had been continuing to have palpitations. She underwent a nuclear stress test which was negative for ischemia. An echocardiogram showed no significant abnormalities other than a small pericardial effusion. Is not clear as to what the cause of this is. She is currently wearing a CardioNet monitor which is scheduled for January 15. She is ready had auto transmissions on December 17 and December 18 showing sinus rhythm and sinus tachycardia with a rate of 137. On December 20 she had 3 transmission showing sinus tachycardia in the 130s with symptoms of heart racing. This was described during  light activities. On January 1 she had an episode of heart racing with heart rate up to 154 doing light activity. She did feel palpitations at the time.  Mr. Saki back today in the office for follow-up of her laboratory work and monitor. The monitor shows  probably sinus tachycardia which seems to be inappropriate. She had heart rates up in the 150s and 160s with light activity. Sometimes this is associated with anxiety but she's noticed that during exercise, even with minimal activity. She reports some improvement in her symptoms on Zoloft as apparent compared to being on Effexor. She continues to take the Lopressor. She has been able to take 25 mg in the morning and 12.5 mg in the evening but blood pressure is too low taking a 25 mg twice daily dose. Rheumatologic workup revealed a negative ANA and CRP and sedimentation rate were negative.  Current Outpatient Prescriptions  Medication Sig Dispense Refill  . ketoprofen (ORUDIS) 75 MG capsule Take 75 mg by mouth 4 (four) times daily as needed (for migraines).     . LORazepam (ATIVAN) 1 MG tablet Take 1 mg by mouth daily as needed for anxiety.    . metoprolol tartrate (LOPRESSOR) 25 MG tablet Take 12.5 mg by mouth 2 (two) times daily. Take 25 MG in the morning and 12.5 MG in the evening.    . sertraline (ZOLOFT) 100 MG tablet Take 0.5 tablets by mouth daily. Will increase to full dose tomorrow (02/05/2014)  0   No current facility-administered medications for this visit.    Allergies  Allergen Reactions  . Bactrim Nausea And Vomiting  . Erythromycin Other (See Comments)    Childhood reaction  . Sulfa Drugs Cross Reactors Nausea And Vomiting    History   Social History  . Marital Status: Married    Spouse Name: N/A    Number of Children: 1  . Years of Education: N/A   Occupational History  .     Social History Main Topics  . Smoking status: Former Smoker    Types: Cigarettes  . Smokeless tobacco: Never Used  . Alcohol Use: Yes  . Drug Use: No  . Sexual Activity: Yes    Birth Control/ Protection: None   Other Topics Concern  . Not on file   Social History Narrative     Review of Systems: General: negative for chills, fever, night sweats or weight changes.  Cardiovascular:  negative for chest pain, dyspnea on exertion, edema, orthopnea, palpitations, paroxysmal nocturnal dyspnea or shortness of breath Dermatological: negative for rash Respiratory: negative for cough or wheezing Urologic: negative for hematuria Abdominal: negative for nausea, vomiting, diarrhea, bright red blood per rectum, melena, or hematemesis Neurologic: negative for visual changes, syncope, or dizziness All other systems reviewed and are otherwise negative except as noted above.    Blood pressure 102/72, pulse 84, height  (1.676 m), weight 169 lb 4.8 oz (76.794 kg).  deferred  EKG deferred  ASSESSMENT AND PLAN:   1.  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 The symptoms have been present for close to 8 years after a very traumatic event. Her cardiovascular workup has been negative for ischemia. She seems to have some improvement in her symptoms on metoprolol and is on is much medicine as she can tolerate this point. We'll continue that medication.  2.  Pericardial effusion: There is no evidence for active inflammation or pericarditis. Her ANA is negative. I would recommend a limited echo to follow the  size of her pericardial effusion in 6 months and see her back at that time.  Chrystie Nose, MD, [facility] Attending Cardiologist [facility]  HILTY,Kenneth C 03/17/2014 10:04 AM

## 2014-03-17 NOTE — Patient Instructions (Signed)
Your physician has requested that you have an echocardiogram. Echocardiography is a painless test that uses sound waves to create images of your heart. It provides your doctor with information about the size and shape of your heart and how well your heart's chambers and valves are working. This procedure takes approximately one hour. There are no restrictions for this procedure. >> in 6 months  Your physician recommends that you schedule a follow-up appointment in: 6 months

## 2014-03-23 ENCOUNTER — Telehealth: Payer: Self-pay | Admitting: Internal Medicine

## 2014-03-23 DIAGNOSIS — R002 Palpitations: Secondary | ICD-10-CM

## 2014-03-23 DIAGNOSIS — R0600 Dyspnea, unspecified: Secondary | ICD-10-CM

## 2014-03-23 NOTE — Telephone Encounter (Signed)
Pt called back, wanted to know next recommendations since Echo and Event Monitor were OK.  I did review labwork w/ pt over the phone.  She is still having complaint of dyspnea, she describes as off and on, more noticeable w/ activity but not necessarily correlative.   She wanted to see if Dr. Rennis GoldenHilty recommended any further tests or pulmonology referral.

## 2014-03-23 NOTE — Telephone Encounter (Signed)
Please call,pt talked to Belgium[place]enna last night and she told her to call back and talk to any nurse today.

## 2014-03-24 NOTE — Telephone Encounter (Signed)
Received call back from patient.Dr.Hilty advised CPX.Stated she wanted to ask Dr.Hilty if she could have a chest ct too.Stated when she exercises she notices she wheezes slightly.Message sent to Dr.Hillty for advice.

## 2014-03-24 NOTE — Telephone Encounter (Signed)
Returned call to patient no answer.LMTC. 

## 2014-03-24 NOTE — Telephone Encounter (Signed)
We could do cardiopulmonary exercise testing to see why she is becoming dyspneic - [facility] J[facility], indications are exertional dyspnea/tachycardia, palpitations.  Thanks.  -[place]had

## 2014-03-26 ENCOUNTER — Ambulatory Visit: Payer: BLUE CROSS/BLUE SHIELD | Admitting: Internal Medicine

## 2014-03-26 NOTE — Telephone Encounter (Signed)
Returned call to patient no answer.LMTC. 

## 2014-03-26 NOTE — Telephone Encounter (Signed)
A CT would not be the appropriate test for wheezing. CPX testing includes PFT's and we could request a bronchodilator test to see if she has any component of exercise-induced asthma.  Dr. HRexene Edison

## 2014-03-26 NOTE — Telephone Encounter (Signed)
Returned call to patient.Dr.Hilty advised chest ct would not be appropriate test for wheezing.Patient stated she would like to have chest ct for sob.Stated her insurance will be running out soon and she would like to have done before it expires.Message sent to Dr.Hilty.

## 2014-03-26 NOTE — Telephone Encounter (Signed)
We could get a 2 view CXR, but cannot justify a CT scan unless her CXR is abnormal.  Dr. HRexene Edison

## 2014-03-30 NOTE — Telephone Encounter (Signed)
Returned call to patient no answer.LMTC. 

## 2014-04-01 NOTE — Telephone Encounter (Signed)
Spoke with patient and informed her of Dr. Blanchie DessertHilty's recommendation to have CXR first. Patient agreed.   [facility]. Patient is aware.

## 2014-04-01 NOTE — Addendum Note (Signed)
Addended by: Lindell SparELKINS, JENNA M on: 04/01/2014 09:19 AM   Modules accepted: Orders

## 2014-04-02 ENCOUNTER — Ambulatory Visit
Admission: RE | Admit: 2014-04-02 | Discharge: 2014-04-02 | Disposition: A | Discharge status: Home / Self Care | Payer: BLUE CROSS/BLUE SHIELD | Source: Ambulatory Visit | Attending: Internal Medicine | Admitting: Internal Medicine

## 2014-04-02 DIAGNOSIS — R0600 Dyspnea, unspecified: Secondary | ICD-10-CM

## 2014-04-14 ENCOUNTER — Encounter (HOSPITAL_COMMUNITY): Payer: BLUE CROSS/BLUE SHIELD

## 2014-04-15 ENCOUNTER — Other Ambulatory Visit: Payer: Self-pay | Admitting: Family Medicine

## 2014-04-15 DIAGNOSIS — G43909 Migraine, unspecified, not intractable, without status migrainosus: Secondary | ICD-10-CM

## 2014-04-20 ENCOUNTER — Ambulatory Visit (HOSPITAL_COMMUNITY): Payer: BLUE CROSS/BLUE SHIELD | Attending: Internal Medicine

## 2014-04-20 DIAGNOSIS — R0600 Dyspnea, unspecified: Secondary | ICD-10-CM | POA: Insufficient documentation

## 2014-04-20 DIAGNOSIS — R002 Palpitations: Secondary | ICD-10-CM | POA: Diagnosis not present

## 2014-04-20 DIAGNOSIS — R0602 Shortness of breath: Secondary | ICD-10-CM | POA: Diagnosis not present

## 2014-05-04 ENCOUNTER — Encounter (HOSPITAL_COMMUNITY): Payer: BLUE CROSS/BLUE SHIELD

## 2014-05-04 ENCOUNTER — Ambulatory Visit
Admission: RE | Admit: 2014-05-04 | Discharge: 2014-05-04 | Disposition: A | Discharge status: Home / Self Care | Payer: BLUE CROSS/BLUE SHIELD | Source: Ambulatory Visit | Attending: Family Medicine | Admitting: Family Medicine

## 2014-05-04 DIAGNOSIS — G43909 Migraine, unspecified, not intractable, without status migrainosus: Secondary | ICD-10-CM

## 2014-05-04 MED ORDER — GADOBENATE DIMEGLUMINE 529 MG/ML IV SOLN: 15.0000 mL | Freq: Once | INTRAVENOUS | Status: AC | PRN

## 2014-05-18 ENCOUNTER — Telehealth: Payer: Self-pay | Admitting: Internal Medicine

## 2014-05-18 NOTE — Telephone Encounter (Signed)
Pt said she had [facility],she preferred to talk to her. She said it was concerning medicine. I asked the name of the medicine,pt did not state,

## 2014-05-19 NOTE — Telephone Encounter (Signed)
Returned call to patient. She saw her OB Dr Estanislado Pandyivard and she was informed that if she is wanting to try to have another child, it may be safer for her to take labetalol vs lopressor. Informed Marcelino DusterMichelle that I will talk with Dr. Rennis GoldenHilty about this and call her back.

## 2014-05-20 NOTE — Telephone Encounter (Signed)
LMTCB

## 2014-05-20 NOTE — Telephone Encounter (Signed)
She could take labetalol - I'm not aware that there is a significant difference in risk of intrauterine growth restriction - however, I would defer to her OB's opinion on this.  Dr. HRexene Edison

## 2014-05-21 NOTE — Telephone Encounter (Signed)
Returning your call. °

## 2014-05-21 NOTE — Telephone Encounter (Signed)
Spoke with patient and communicated advice per Dr. Rennis GoldenHilty. She voiced understanding. She expressed she may want to try to wean off of metoprolol and has been working on breathing techniques to assists with anxiety reduction. She will contact her OB to talk about meds and weaning off of them.

## 2014-05-27 ENCOUNTER — Other Ambulatory Visit: Payer: Self-pay | Admitting: Nurse Practitioner

## 2014-06-15 MED ORDER — LABETALOL HCL 100 MG PO TABS: 100.0000 mg | ORAL_TABLET | Freq: Two times a day (BID) | ORAL | Status: DC

## 2014-06-15 NOTE — Telephone Encounter (Addendum)
Since patient reports taking only metoprolol tartrate 25mg  once daily - OK to take labetalol once daily   Patient aware

## 2014-06-15 NOTE — Addendum Note (Signed)
Addended by: Lindell SparELKINS, Kimble Delaurentis M on: 06/15/2014 05:15 PM   Modules accepted: Orders, Medications

## 2014-06-15 NOTE — Telephone Encounter (Signed)
Patient called to inquire about obtaining Rx for labetalol. Per Dr. Rennis GoldenHilty, patient can take labetalol 100mg  BID Rx(s) sent to pharmacy electronically. Patient notified.

## 2014-08-18 ENCOUNTER — Ambulatory Visit (HOSPITAL_COMMUNITY)
Admission: RE | Admit: 2014-08-18 | Discharge: 2014-08-18 | Disposition: A | Discharge status: Home / Self Care | Payer: 59 | Source: Ambulatory Visit | Attending: Internal Medicine | Admitting: Internal Medicine

## 2014-08-18 ENCOUNTER — Other Ambulatory Visit: Payer: Self-pay | Admitting: Internal Medicine

## 2014-08-18 DIAGNOSIS — I319 Disease of pericardium, unspecified: Secondary | ICD-10-CM

## 2014-08-18 DIAGNOSIS — I3139 Other pericardial effusion (noninflammatory): Secondary | ICD-10-CM

## 2014-08-18 DIAGNOSIS — I313 Pericardial effusion (noninflammatory): Secondary | ICD-10-CM | POA: Insufficient documentation

## 2014-08-31 ENCOUNTER — Encounter: Payer: Self-pay | Admitting: Internal Medicine

## 2014-08-31 ENCOUNTER — Ambulatory Visit (INDEPENDENT_AMBULATORY_CARE_PROVIDER_SITE_OTHER): Payer: 59 | Admitting: Internal Medicine

## 2014-08-31 VITALS — BP 120/90 | HR 86 | Ht 66.0 in | Wt 161.8 lb

## 2014-08-31 DIAGNOSIS — I313 Pericardial effusion (noninflammatory): Secondary | ICD-10-CM

## 2014-08-31 DIAGNOSIS — R002 Palpitations: Secondary | ICD-10-CM | POA: Diagnosis not present

## 2014-08-31 DIAGNOSIS — I3139 Other pericardial effusion (noninflammatory): Secondary | ICD-10-CM

## 2014-08-31 DIAGNOSIS — I319 Disease of pericardium, unspecified: Secondary | ICD-10-CM | POA: Diagnosis not present

## 2014-08-31 DIAGNOSIS — R000 Tachycardia, unspecified: Secondary | ICD-10-CM

## 2014-08-31 NOTE — Patient Instructions (Signed)
Your physician recommends that you schedule a follow-up appointment as needed  

## 2014-08-31 NOTE — Progress Notes (Signed)
08/31/2014 [name]   1975/10/29  [id number]  Primary Physician Ailene Ravel, MD Primary Cardiologist: Dr. Swaziland  CC: Follow-up echo  HPI:  [name] presents to clinic today for evaluation of palpitations. She is a 39 y/o female who has been seen in the past by Dr. Swaziland. Her last office visit was with [name], NP, in April of this year.   She has a history of palpitations, SVT/tachycardia and has been on beta blocker therapy with metoprolol. Prior cardiac assessments have included an echocardiogram in 2009 which was a normal study with normal LV function. She has never had a stress test. She reports a strong family history of CAD/sudden cardiac death. Her paternal grandfather died suddenly at the age of 43 ( was told that this was cardiac related/likely MI). Her father also has coronary disease and has suffered 2 myocardial infarctions, the first in his 60s. She denies any personal history of tobacco abuse. She has no diagnoses of diabetes. She does not recall undergoing fasting laboratory work for lipid studies in the past. She also admits that she has a history of anxiety. This was made more pronounced 8 years ago after she and her husband suffered the loss of a child. She is followed by a psychiatrist, has undergone counseling and has also been on medications for anxiety/depression.  Her main concern today is increased frequency of palpitations. She is concerned that this may not all be anxiety related and she presents for evaluation for assessment to rule out cardiac etiologies, given her strong family history of premature cardiovascular disease.  Over the last several months she reports frequent episodes of palpitations that occur several times per week. Episodes often lasts several minutes. She notes associated chest discomfort (often pressure-like), dyspnea, diaphoresis, dizziness, lightheadedness and near syncope. She denies any recent frank syncope, although she notes  that she did have a syncopal episode years ago. She denies any chest pain in the absence of palpitations. Recently, she had an episode that prompted her to seek emergency evaluation. She was seen in the [facility] emergency department 11/18/2013. Per records, she was noted to be tachycardic with a heart rate in the 140s. Her labs were unremarkable. She was apparently given IV Lopressor, IV fluids and Ativan and her heart rate improved into the 90s. She was discharged directly from the ED and did not require admission. Since that time, she has continued to have frequent symptoms.  She reports that she has been fully compliant with her medications including metoprolol. However, she notes that this was cut back years ago due to symptomatic hypotension. She is on short-acting Lopressor (25 mg) but only takes this once a day. She denies any excessive caffeine intake. She notes that she is a social drinker and denies regular use of alcohol and no excessive binge drinking. She denies any recreational drug use. No known history of thyroid disorders.  [name] returns today for follow-up. She had been continuing to have palpitations. She underwent a nuclear stress test which was negative for ischemia. An echocardiogram showed no significant abnormalities other than a small pericardial effusion. Is not clear as to what the cause of this is. She is currently wearing a CardioNet monitor which is scheduled for January 15. She is ready had auto transmissions on December 17 and December 18 showing sinus rhythm and sinus tachycardia with a rate of 137. On December 20 she had 3 transmission showing sinus tachycardia in the 130s with symptoms of heart racing.  This was described during light activities. On January 1 she had an episode of heart racing with heart rate up to 154 doing light activity. She did feel palpitations at the time.  [name] back today in the office for follow-up of her laboratory work and monitor. The  monitor shows probably sinus tachycardia which seems to be inappropriate. She had heart rates up in the 150s and 160s with light activity. Sometimes this is associated with anxiety but she's noticed that during exercise, even with minimal activity. She reports some improvement in her symptoms on Zoloft as apparent compared to being on Effexor. She continues to take the Lopressor. She has been able to take 25 mg in the morning and 12.5 mg in the evening but blood pressure is too low taking a 25 mg twice daily dose. Rheumatologic workup revealed a negative ANA and CRP and sedimentation rate were negative.  Marcelino DusterMichelle returns today for follow-up of her echo. She underwent a repeat limited echo which shows normal LV function and resolution of her pericardial effusion. She says she's had no more palpitations of significance. She is currently off of all beta blockers as she is working on trying to become pregnant again. She understands that there is a high risk of her having recurrent palpitations during a subsequent pregnancy.  Current Outpatient Prescriptions  Medication Sig Dispense Refill  . ketoprofen (ORUDIS) 75 MG capsule Take 75 mg by mouth 4 (four) times daily as needed (for migraines).     . labetalol (NORMODYNE) 100 MG tablet Take 1 tablet (100 mg total) by mouth 2 (two) times daily. 60 tablet 3  . LORazepam (ATIVAN) 1 MG tablet Take 1 mg by mouth daily as needed for anxiety.    . sertraline (ZOLOFT) 100 MG tablet Take 0.5 tablets by mouth daily. Will increase to full dose tomorrow (02/05/2014)  0   No current facility-administered medications for this visit.    Allergies  Allergen Reactions  . Bactrim Nausea And Vomiting  . Erythromycin Other (See Comments)    Childhood reaction  . Sulfa Drugs Cross Reactors Nausea And Vomiting    History   Social History  . Marital Status: Married    Spouse Name: N/A  . Number of Children: 1  . Years of Education: N/A   Occupational History  .       Social History Main Topics  . Smoking status: Former Smoker    Types: Cigarettes  . Smokeless tobacco: Never Used  . Alcohol Use: Yes  . Drug Use: No  . Sexual Activity: Yes    Birth Control/ Protection: None   Other Topics Concern  . Not on file   Social History Narrative     Review of Systems: General: negative for chills, fever, night sweats or weight changes.  Cardiovascular: negative for chest pain, dyspnea on exertion, edema, orthopnea, palpitations, paroxysmal nocturnal dyspnea or shortness of breath Dermatological: negative for rash Respiratory: negative for cough or wheezing Urologic: negative for hematuria Abdominal: negative for nausea, vomiting, diarrhea, bright red blood per rectum, melena, or hematemesis Neurologic: negative for visual changes, syncope, or dizziness All other systems reviewed and are otherwise negative except as noted above.    Blood pressure 120/90, pulse 86, height 5\' 6"  (1.676 m), weight 161 lb 12.8 oz (73.392 kg).  deferred  EKG deferred  ASSESSMENT AND PLAN:   1.  Palpitations:  Essentially resolved. She is off of all beta blockers as she is determined to become pregnant. 2.  Pericardial  effusion: Resolved.  I'm happy to see Ms. Pascal Lux back in the office as needed if she should have worsening palpitations during her pregnancies. Management with metoprolol or labetalol is reasonable, remembering that they may be associated with decreased birth weight. Follow-up with me as needed.  Chrystie Nose, MD, [facility] Attending Cardiologist [facility]  Lisette Abu [facility] 08/31/2014 11:01 AM

## 2014-10-19 ENCOUNTER — Telehealth: Payer: Self-pay | Admitting: *Deleted

## 2014-10-19 NOTE — Telephone Encounter (Signed)
Patient reports she has been experiencing shortness of breath, like she has to take a deep breath sometimes. She states she gets out of breath easily. She states she notices this at rest and with exertion. She reports her palpitations are better. She asks "am I out of shape?". She reports she has been helping her husband work in the [employer] and wonders if her SOB is environmental. She reports it feels heavy sometimes. She is worried as she is trying to get pregnant.   Patient was advised that her symptoms are likely not cardiac as she had a recent normal echo.   She requests an ultrasound of her lungs. She states she told him about the SOB at her follow up visit in July and wants to know if she can have a CT of her lungs for SOB.   Informed patient that MD is out of the office this week and this would have to be deferred to him to decide on further testing as there is nothing denoted in the last OV note recommended any further testing.

## 2014-10-26 NOTE — Telephone Encounter (Signed)
I agree - give her recent echo findings, this may be pulmonary. Would defer to her PCP - she will want to examine her and may do further work-up (CXR or PFT's for example).  Thanks.  Dr. Rexene Edison

## 2014-10-26 NOTE — Telephone Encounter (Signed)
LMTCB

## 2014-10-27 NOTE — Telephone Encounter (Signed)
Patient notified of MD recommendations.

## 2015-03-28 ENCOUNTER — Encounter: Payer: Self-pay | Admitting: Physician Assistant

## 2015-03-28 ENCOUNTER — Ambulatory Visit: Payer: Self-pay | Admitting: Nurse Practitioner

## 2015-03-28 ENCOUNTER — Ambulatory Visit (INDEPENDENT_AMBULATORY_CARE_PROVIDER_SITE_OTHER): Payer: BLUE CROSS/BLUE SHIELD | Admitting: Physician Assistant

## 2015-03-28 VITALS — BP 136/90 | HR 98 | Ht 66.0 in | Wt 179.0 lb

## 2015-03-28 DIAGNOSIS — R002 Palpitations: Secondary | ICD-10-CM | POA: Diagnosis not present

## 2015-03-28 DIAGNOSIS — R42 Dizziness and giddiness: Secondary | ICD-10-CM

## 2015-03-28 NOTE — Progress Notes (Signed)
Cardiology Office Note   Date:  03/28/2015   ID:  [name], DOB [birth year], MRN [id number]  PCP:  Ailene Ravel, MD  Cardiologist:  Dr Weston Anna, PA-C   Chief Complaint  Patient presents with  . Follow-up    had some heart racing, occassional shortness of breath,no edema, has cramping in feet, only during episode lightheadedness or dizziness    History of Present Illness: [name] is a 40 y.o. female with a history of SVT/tachycardia, on BB. ECHO nl 2009, strong FH CAD, anxiety/depression, MV OK.  [name] presents for evaluation of tachycardia.  She is aware that she has problems with short fuse. She and her mother agreed that she gets angry and can become angry very quickly over relatively minor things. This is one of the things that she has been working with her psychiatrist on.  She was previously on beta blockers, but was taken off them because she might become pregnant. She is not yet taking folic acid that she would need to take prior to trying to become pregnant. She is also aware that her anger issues became worse after she lost a baby boy she had named Kellie Shropshire at a little less than [redacted] weeks gestation who had significant birth defects.  Previously, she had been asked to cut out caffeine. She is currently using caffeine at 1-3 servings per day. She also recently had testing that showed she was allergic to gluten. She went off gluten for about a week, but has gone back on it.  Today, she was in her usual state of health, with minimal activity, and had sudden onset of palpitations. There were very fast, but she doesn't know how fast. She was a little dizzy with them, but in no danger of losing consciousness. She tried to calm herself down by taking deep breaths and took an Ativan. The symptoms resolved. This is the first time she has had symptoms like this in a long time, possibly up to a year. She was concerned about her symptoms and so she came to  the office for evaluation. She is not currently tachycardic and currently feels well. She has had little flutters that are very brief and will resolve by taking a deep breath or 2. This was different, and lasted longer, but has resolved.   Past Medical History  Diagnosis Date  . Anxiety   . Depression   . History of miscarriage   . Syncope and collapse   . Incomplete RBBB   . Palpitations   . Tachycardia   . Breast mass     left  . Headache(784.0)     migraines  . Abnormal Pap smear   . GERD (gastroesophageal reflux disease)     Past Surgical History  Procedure Laterality Date  . Cholecystectomy    . US echocardiography  06/17/2007    EF 55-60%  . Lipoma excision  2010    left leg  . Dilation and curettage of uterus  2008  . Breast enhancement surgery  2012  . 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 ketoprofen (ORUDIS) 75 MG capsule Take 75 mg by mouth 4 (four) times daily as needed (for migraines).     . LORazepam (ATIVAN) 1 MG tablet Take 1 mg by mouth daily as needed for anxiety.    . sertraline (ZOLOFT) 100 MG tablet Take  1.5 tablets by mouth daily. Will increase to full dose tomorrow (02/05/2014)  0   No current facility-administered medications for this visit.    Allergies:   Amoxicillin; Bactrim; Erythromycin; and Sulfa drugs cross reactors    Social History:  The patient  reports that she has quit smoking. Her smoking use included Cigarettes. She has never used smokeless tobacco. She reports that she drinks alcohol. She reports that she does not use illicit drugs.   Family History:  The patient's family history includes COPD in her maternal grandmother and mother; Crohn's disease in her brother; Diabetes in her maternal grandmother; Heart attack in her father; Heart disease in her father and mother; Hyperlipidemia in her mother; Hypertension in her  father and mother.    ROS:  Please see the history of present illness. All other systems are reviewed and negative.    PHYSICAL EXAM: VS:  BP 136/90 mmHg  Pulse 98  Ht  (1.676 m)  Wt 179 lb (81.194 kg)  BMI 28.91 kg/m2 , BMI Body mass index is 28.91 kg/(m^2). GEN: Well nourished, well developed, female in no acute distress HEENT: normal for age  Neck: no JVD, no carotid bruit, no masses Cardiac: RRR; no murmur, no rubs, or gallops Respiratory:  clear to auscultation bilaterally, normal work of breathing GI: soft, nontender, nondistended, + BS MS: no deformity or atrophy; no edema; distal pulses are 2+ in all 4 extremities  Skin: warm and dry, no rash Neuro:  Strength and sensation are intact Psych: euthymic mood, full affect   EKG:  EKG is ordered today. The ekg ordered today demonstrates sinus rhythm, no acute ischemic changes Minimal Q waves seen in inferior leads were seen and ECGs from 2015, no pathologic Q waves   Recent Labs: No results found for requested labs within last 365 days.    Lipid Panel    Component Value Date/Time   CHOL 134 02/05/2014 0846   TRIG 131 02/05/2014 0846   HDL 47 02/05/2014 0846   CHOLHDL 2.9 02/05/2014 0846   VLDL 26 02/05/2014 0846   LDLCALC 61 02/05/2014 0846     Wt Readings from Last 3 Encounters:  03/28/15 179 lb (81.194 kg)  08/31/14 161 lb 12.8 oz (73.392 kg)  03/17/14 169 lb 4.8 oz (76.794 kg)     Other studies Reviewed: Additional studies/ records that were reviewed today include: Office Notes from previous testing.  ASSESSMENT AND PLAN:  1.  Palpitations, tachycardia: We discussed options. I reviewed the fact that sinus tachycardia with rates up to approximately 130 had been seen on previous event monitor. I advised I did not see any particular benefit from getting another event monitor unless we had documentation that her heart was going much faster. I recommended that she get a watch or an application for her phone  that would give her a heart rate. That way, when she has the symptoms, she can check her heart rate and let us know what it is. This will help great deal deciding further evaluation is needed.   I advised that since she was not having these palpitations regularly, a loop recorder might be the best option for catching them if she finds out that her heart rate is going well above 150. Currently, we will defer this.  I advised that we could start propanolol at a low dose if medical therapy was indicated as this particular beta blocker is the safest one in pregnancy. However, I do not recommend doing that unless  her symptoms become much more frequent and onerous. Patient is in agreement with this.  Lifestyle changes are recommended with avoidance of caffeine and gluten. She will contact us if she has further issues.   Current medicines are reviewed at length with the patient today.  The patient does not have concerns regarding medicines.  The following changes have been made:  no change  Labs/ tests ordered today include:   Orders Placed This Encounter  Procedures  . EKG 12-Lead     Disposition:   FU with Dr. Rennis Golden  Signed, Leanna Battles  03/28/2015 4:17 PM    [facility] [address] [place], [place], [place]  [address] Phone: [phone]; Fax: [phone]

## 2015-03-28 NOTE — Patient Instructions (Addendum)
Medication Instructions:  Your physician recommends that you continue on your current medications as directed. Please refer to the Current Medication list given to you today.  Lab work: NONE  Testing/Procedures: NONE  Follow-Up: Your physician wants you to follow-up in: 3 months with Dr. Rennis Golden.   Your physician wants you to: 1- See about getting a App on your phone that records heart rate 2-Please cut out caffeine and gluten free from diet 3-Please give our office a call if you change your mind about getting a loop recorder and if you want to start taking Labetalol again.   If you need a refill on your cardiac medications before your next appointment, please call your pharmacy.

## 2015-06-07 ENCOUNTER — Telehealth: Payer: Self-pay | Admitting: Internal Medicine

## 2015-06-08 NOTE — Telephone Encounter (Signed)
Close encounter 

## 2015-06-23 ENCOUNTER — Ambulatory Visit: Payer: BLUE CROSS/BLUE SHIELD | Admitting: Internal Medicine

## 2015-06-27 ENCOUNTER — Encounter: Payer: Self-pay | Admitting: Internal Medicine

## 2015-06-27 ENCOUNTER — Ambulatory Visit (INDEPENDENT_AMBULATORY_CARE_PROVIDER_SITE_OTHER): Payer: BLUE CROSS/BLUE SHIELD | Admitting: Internal Medicine

## 2015-06-27 VITALS — BP 112/88 | HR 80 | Ht 66.0 in | Wt 180.8 lb

## 2015-06-27 DIAGNOSIS — R002 Palpitations: Secondary | ICD-10-CM | POA: Diagnosis not present

## 2015-06-27 DIAGNOSIS — R000 Tachycardia, unspecified: Secondary | ICD-10-CM | POA: Diagnosis not present

## 2015-06-27 NOTE — Progress Notes (Addendum)
06/27/2015 [name]   1976/01/22  [id number]  Primary Physician Ailene Ravel, MD Primary Cardiologist: Dr. Swaziland  CC: Follow-up palpitations  HPI:  [name] presents to clinic today for evaluation of palpitations. She is a 40 y/o female who has been seen in the past by Dr. Swaziland. Her last office visit was with [name], NP, in April of this year.   She has a history of palpitations, SVT/tachycardia and has been on beta blocker therapy with metoprolol. Prior cardiac assessments have included an echocardiogram in 2009 which was a normal study with normal LV function. She has never had a stress test. She reports a strong family history of CAD/sudden cardiac death. Her paternal grandfather died suddenly at the age of 71 ( was told that this was cardiac related/likely MI). Her father also has coronary disease and has suffered 2 myocardial infarctions, the first in his 35s. She denies any personal history of tobacco abuse. She has no diagnoses of diabetes. She does not recall undergoing fasting laboratory work for lipid studies in the past. She also admits that she has a history of anxiety. This was made more pronounced 8 years ago after she and her husband suffered the loss of a child. She is followed by a psychiatrist, has undergone counseling and has also been on medications for anxiety/depression.  Her main concern today is increased frequency of palpitations. She is concerned that this may not all be anxiety related and she presents for evaluation for assessment to rule out cardiac etiologies, given her strong family history of premature cardiovascular disease.  Over the last several months she reports frequent episodes of palpitations that occur several times per week. Episodes often lasts several minutes. She notes associated chest discomfort (often pressure-like), dyspnea, diaphoresis, dizziness, lightheadedness and near syncope. She denies any recent frank syncope, although she  notes that she did have a syncopal episode years ago. She denies any chest pain in the absence of palpitations. Recently, she had an episode that prompted her to seek emergency evaluation. She was seen in the [facility] emergency department 11/18/2013. Per records, she was noted to be tachycardic with a heart rate in the 140s. Her labs were unremarkable. She was apparently given IV Lopressor, IV fluids and Ativan and her heart rate improved into the 90s. She was discharged directly from the ED and did not require admission. Since that time, she has continued to have frequent symptoms.  She reports that she has been fully compliant with her medications including metoprolol. However, she notes that this was cut back years ago due to symptomatic hypotension. She is on short-acting Lopressor (25 mg) but only takes this once a day. She denies any excessive caffeine intake. She notes that she is a social drinker and denies regular use of alcohol and no excessive binge drinking. She denies any recreational drug use. No known history of thyroid disorders.  [name] returns today for follow-up. She had been continuing to have palpitations. She underwent a nuclear stress test which was negative for ischemia. An echocardiogram showed no significant abnormalities other than a small pericardial effusion. Is not clear as to what the cause of this is. She is currently wearing a CardioNet monitor which is scheduled for January 15. She is ready had auto transmissions on December 17 and December 18 showing sinus rhythm and sinus tachycardia with a rate of 137. On December 20 she had 3 transmission showing sinus tachycardia in the 130s with symptoms of heart racing.  This was described during light activities. On January 1 she had an episode of heart racing with heart rate up to 154 doing light activity. She did feel palpitations at the time.  [name] back today in the office for follow-up of her laboratory work and  monitor. The monitor shows probably sinus tachycardia which seems to be inappropriate. She had heart rates up in the 150s and 160s with light activity. Sometimes this is associated with anxiety but she's noticed that during exercise, even with minimal activity. She reports some improvement in her symptoms on Zoloft as apparent compared to being on Effexor. She continues to take the Lopressor. She has been able to take 25 mg in the morning and 12.5 mg in the evening but blood pressure is too low taking a 25 mg twice daily dose. Rheumatologic workup revealed a negative ANA and CRP and sedimentation rate were negative.  [name] returns today for follow-up of her echo. She underwent a repeat limited echo which shows normal LV function and resolution of her pericardial effusion. She says she's had no more palpitations of significance. She is currently off of all beta blockers as she is working on trying to become pregnant again. She understands that there is a high risk of her having recurrent palpitations during a subsequent pregnancy.  06/27/2015  [name] was seen back today in the office for follow-up. In February she had recurrent tachycardia and palpitations. This seemed to be associated with some anger/frustration. She's been under a lot of stress as expected. She has been trying to reduce caffeine intake. She since has had no further episodes. She is still not interested in medications as she is working on trying to get pregnant.  Current Outpatient Prescriptions  Medication Sig Dispense Refill  . ketoprofen (ORUDIS) 75 MG capsule Take 75 mg by mouth 4 (four) times daily as needed (for migraines).     . LORazepam (ATIVAN) 1 MG tablet Take 1 mg by mouth daily as needed for anxiety.    Marland Kitchen. omeprazole (PRILOSEC) 40 MG capsule Take 1 capsule by mouth daily.  11  . sertraline (ZOLOFT) 100 MG tablet Take by mouth daily.   0   No current facility-administered medications for this visit.    Allergies    Allergen Reactions  . Amoxicillin Hives  . Bactrim Nausea And Vomiting  . Erythromycin Other (See Comments)    Childhood reaction  . Sulfa Drugs Cross Reactors Nausea And Vomiting    Social History   Social History  . Marital Status: Married    Spouse Name: N/A  . Number of Children: 1  . Years of Education: N/A   Occupational History  .     Social History Main Topics  . Smoking status: Former Smoker    Types: Cigarettes  . Smokeless tobacco: Never Used  . Alcohol Use: Yes  . Drug Use: No  . Sexual Activity: Yes    Birth Control/ Protection: None   Other Topics Concern  . Not on file   Social History Narrative     Review of Systems: General: negative for chills, fever, night sweats or weight changes.  Cardiovascular: negative for chest pain, dyspnea on exertion, edema, orthopnea, palpitations, paroxysmal nocturnal dyspnea or shortness of breath Dermatological: negative for rash Respiratory: negative for cough or wheezing Urologic: negative for hematuria Abdominal: negative for nausea, vomiting, diarrhea, bright red blood per rectum, melena, or hematemesis Neurologic: negative for visual changes, syncope, or dizziness All other systems reviewed and are otherwise  negative except as noted above.    Blood pressure 112/88, pulse 80, height 5\' 6"  (1.676 m), weight 180 lb 12.8 oz (82.01 kg).  deferred  EKG Sinus rhythm with sinus arrhythmia at 80  ASSESSMENT AND PLAN:   1.  Palpitations:  Essentially resolved. She is off of all beta blockers as she is determined to become pregnant. 2.  Pericardial effusion: Resolved.  I'm happy to see [name] back in the office as needed if she should have worsening palpitations during her pregnancies. Management with metoprolol, propranolol or labetalol is reasonable, remembering that they may be associated with decreased birth weight. Follow-up with me as needed.  Chrystie Nose, MD, [facility] Attending Cardiologist [facility]  Lisette Abu [facility] 06/27/2015 8:37 AM

## 2015-06-27 NOTE — Patient Instructions (Signed)
Your physician recommends that you schedule a follow-up appointment as needed  

## 2015-07-07 ENCOUNTER — Telehealth: Payer: Self-pay | Admitting: Internal Medicine

## 2015-07-07 NOTE — Telephone Encounter (Signed)
New message      Pt was seen last week.  She has been released by her phychiatric physician.  She want to know if Dr Rennis GoldenHilty would refill her zoloft and ativan (on an as need basis) if she ever needs a refill?

## 2015-07-07 NOTE — Telephone Encounter (Signed)
Left message for pt, she will need to get meds from PCP.

## 2016-10-23 LAB — OB RESULTS CONSOLE RPR: RPR: NONREACTIVE

## 2016-10-23 LAB — OB RESULTS CONSOLE ABO/RH: RH TYPE: POSITIVE

## 2016-10-23 LAB — OB RESULTS CONSOLE HEPATITIS B SURFACE ANTIGEN: Hepatitis B Surface Ag: NEGATIVE

## 2016-10-23 LAB — OB RESULTS CONSOLE GC/CHLAMYDIA
Chlamydia: NEGATIVE
Gonorrhea: NEGATIVE

## 2016-10-23 LAB — OB RESULTS CONSOLE ANTIBODY SCREEN: ANTIBODY SCREEN: NEGATIVE

## 2016-10-23 LAB — OB RESULTS CONSOLE RUBELLA ANTIBODY, IGM: Rubella: IMMUNE

## 2016-10-23 LAB — OB RESULTS CONSOLE HIV ANTIBODY (ROUTINE TESTING): HIV: NONREACTIVE

## 2016-12-10 ENCOUNTER — Other Ambulatory Visit: Payer: Self-pay

## 2016-12-14 ENCOUNTER — Other Ambulatory Visit (HOSPITAL_COMMUNITY): Payer: Self-pay

## 2016-12-19 ENCOUNTER — Encounter (HOSPITAL_COMMUNITY): Payer: Self-pay | Admitting: *Deleted

## 2016-12-20 ENCOUNTER — Other Ambulatory Visit (HOSPITAL_COMMUNITY): Payer: Self-pay | Admitting: Obstetrics and Gynecology

## 2016-12-20 ENCOUNTER — Ambulatory Visit (HOSPITAL_COMMUNITY)
Admission: RE | Admit: 2016-12-20 | Discharge: 2016-12-20 | Disposition: A | Discharge status: Home / Self Care | Payer: Self-pay | Source: Ambulatory Visit | Attending: Obstetrics and Gynecology | Admitting: Obstetrics and Gynecology

## 2016-12-20 ENCOUNTER — Encounter (HOSPITAL_COMMUNITY): Payer: Self-pay

## 2016-12-20 DIAGNOSIS — Z3A13 13 weeks gestation of pregnancy: Secondary | ICD-10-CM

## 2016-12-20 DIAGNOSIS — R899 Unspecified abnormal finding in specimens from other organs, systems and tissues: Secondary | ICD-10-CM

## 2016-12-20 DIAGNOSIS — O09522 Supervision of elderly multigravida, second trimester: Secondary | ICD-10-CM | POA: Insufficient documentation

## 2016-12-20 DIAGNOSIS — O285 Abnormal chromosomal and genetic finding on antenatal screening of mother: Secondary | ICD-10-CM

## 2016-12-20 DIAGNOSIS — O09529 Supervision of elderly multigravida, unspecified trimester: Secondary | ICD-10-CM | POA: Insufficient documentation

## 2016-12-20 HISTORY — DX: Unspecified abnormal cytological findings in specimens from vagina: R87.629

## 2016-12-20 NOTE — Progress Notes (Signed)
Genetic Counseling  High-Risk Gestation Note  Appointment Date:  12/20/2016 Referred By: Ailene Ravel, MD Date of Birth:  [birth year] Partner:  Mickeal Needy   Pregnancy History: [id number] Estimated Date of Delivery: 06/27/17 Estimated Gestational Age: [redacted]w[redacted]d Attending: Darlyn Read, MD  [name] and her husband, [name], were seen for genetic counseling because of a maternal age of 41 y.o. and low fetal fraction high risk noninvasive prenatal screening (NIPS).   In summary:  Discussed AMA and associated risk for fetal aneuploidy  Reviewed NIPS ([facility] through [facility]) result  Low fetal fraction in sample (2.5%) meaning specific analysis not able to be performed for the pregnancy from this sample  Low fetal fraction when not explained by other factors is associated in general with 1 in 17 risk for underlying fetal aneuploidy  Discussed options for screening  NIPS follow-up using different lab with different methodology- declined  Ultrasound- ultrasound today within normal limits  Detailed ultrasound- early anatomy scan scheduled 01/14/17  Discussed diagnostic testing options  [facility]- declined  Amniocentesis- declined at this time but possibly considering to pursue on 01/14/17 at time of ultrasound  Reviewed family history concerns  Previous pregnancy with open spina bifida  Recurrence risk approximately 3%, in the case of multifactorial inheritance  Discussed screening options for ONTDs in the second trimester  They were counseled regarding maternal age and the association with risk for chromosome conditions due to nondisjunction with aging of the ova.   We reviewed chromosomes, nondisjunction, and the associated risk for fetal aneuploidy related to a maternal age of 41 y.o. at [redacted]w[redacted]d gestation.  They were counseled that the risk for aneuploidy decreases as gestational age increases, accounting for those pregnancies which spontaneously abort.  We specifically  discussed Down syndrome (trisomy 62), trisomies 98 and 42, and sex chromosome aneuploidies (47,XXX and 47,XXY) including the common features and prognoses of each.  Mrs. Deepti Gunawan previously had noninvasive prenatal screening (NIPS)/prenatal cell free DNA testing performed through her OB provider. This screening, specifically [facility] through [facility] laboratory identified low fetal fraction (2.5%). From laboratory data, pregnancies with low fetal fraction in general without an obvious explanation for the low fetal fraction are associated with a 1 in 17 risk for underlying fetal aneuploidy (trisomy 46, trisomy 71, or triploidy). We reviewed the methodology of NIPS and discussed differential diagnoses for low fetal fraction including obesity, early gestational age, physiological differences in maternal blood, physiological differences in the placenta, and underlying fetal aneuploidy. Additionally, we discussed that there may be additional factors that impact fetal fraction in pregnancy that are not yet reported in the medical literature. When fetal fraction is on average less than 4%, [facility] has in recent years not been able to perform risk assessment for fetal aneuploidy conditions.  Recent outcome study data from pregnancies with low fetal fraction from [facility] laboratory (McKanna et al 2018), when not expected to be low based on maternal weight or gestational age, identified an increased risk for underlying fetal triploidy, trisomy 86, or trisomy 67, with a positive predictive value of approximately 1 in 44.   Ultrasound was performed today. Exam was limited by early gestational age but no gross fetal abnormalities identified and no markers of aneuploidy visualized today. Complete ultrasound report under separate cover.   Follow-up ultrasound was scheduled for 01/15/17 for early fetal anatomic survey.  We reviewed the benefits and limitations of ultrasound as a screening tool for fetal aneuploidy. We  specifically reviewed its utility in screening for markers  of fetal triploidy, trisomy 65, and trisomy 48.    We reviewed additional available screening options, including the option of repeat NIPS through a different laboratory, though discussed the possibility still exists of not obtaining a result due to low fetal fraction. She was counseled that screening tests are used to modify a patient's a priori risk for aneuploidy, typically based on age. This estimate provides a pregnancy specific risk assessment. We reviewed the benefits and limitations of each option. Specifically, we discussed the conditions for which each test screens, the detection rates, and false positive rates of each. She was also counseled regarding diagnostic testing via [facility] and amniocentesis. We reviewed the approximate 1 in 100 risk for complications from CVS and the associated 1 in 300-500 risk for complications from amniocentesis, including spontaneous pregnancy loss. We discussed the possible results that the tests might provide including: positive, negative, unanticipated, and no result. Finally, they were counseled regarding the cost of each option and potential out of pocket expenses.   After consideration of all the options, [name] declined [facility] and declined repeat NIPS through different laboratory today. The couple is considering amniocentesis and may consider this option at the time of early anatomic survey. They understand that ultrasound is limited, particularly in the early second trimester. She understands that screening tests cannot rule out all birth defects or genetic syndromes. The patient was advised of this limitation and states she still does not want additional testing at this time.   Both family histories were reviewed and found to be contributory for previous pregnancy for the couple with open spina bifida, and the pregnancy was ended. Chromosome analysis was reportedly normal, and no additional birth  defects were present in that pregnancy. We discussed that spina bifida is a common birth differences and affects approximately 1 in 500 live births.  They were counseled that NTDs occurs as an isolated finding, in the majority of cases.  Isolated  indicates that the NTD is the only birth difference that occurred in the baby.  Isolated NTDs are usually inherited in a multifactorial manner in which there is no prior family history.  Multifactorial conditions have both environmental and genetic factors that contribute to their development.  Both the genetic and environmental factors that contribute to the development of spina bifida are largely unknown; however, some medications and health conditions, such as uncontrolled diabetes and obesity, may increase the chance of spina bifida.  We also discussed the role of folic acid in the development of the neural tube and prevention of spina bifida.  We also discussed that NTDs may occur as a feature of an underlying genetic syndrome or condition.  In the case of a nonsyndromic, multifactorial NTD, the risk of recurrence is estimated to be 3% for the couple's offspring.  If however, the child had an underlying genetic syndrome, the risk of recurrence depends upon the inheritance of the condition.  We discussed that screening is available for ONTDs in the second trimester via MSAFP and detailed ultrasound. We reviewed the benefits and limitations of each. Additional testing is available via amniocentesis for AFAFP analysis. Risks, benefits, and limitations were reviewed.    Mr. Freeberg reported a maternal uncle with congenital heart disease. He was otherwise healthy. We discussed that congenital heart defects (CHD) can be isolated or a feature of an underlying genetic condition. Congenital heart defects are most often multifactorial in etiology, but can also result from chromosome aberrations, single gene conditions, or teratogenic exposures. We discussed that  isolated,  nonsyndromic CHDs occur in approximately 1% of the general population. If Mr. Darcella CheshireCain's relative had an isolated CHD, the risk of recurrence is not expected to be increased above the general population risk. If however, her relative had an underlying genetic condition that caused the CHD, the risk of recurrence could be increased. In this case, the specific chance of recurrence depends on the inheritance of the condition. Without further information, an accurate risk assessment cannot be provided. We discussed the availability of a detailed anatomy ultrasound.  [name] denied exposure to environmental toxins or chemical agents. She denied the use of alcohol, tobacco or street drugs. She denied significant viral illnesses during the course of her pregnancy. She reported taking Zoloft during the pregnancy. Based on available study data of use on Zoloft during human pregnancy, no apparent association with increased risk of birth defects is suggested. Use of Zoloft and other serotonin reuptake inhibitors late in gestation has been associated with an increased risk of mild transient neonatal syndrome of central nervous system including irritability, vomiting, jitteriness and convulsions, as well as neonatal pulmonary hypertension. These associated symptoms typically do not appear to occur frequently or severely.   I counseled this couple regarding the above risks and available options.  The approximate face-to-face time with the genetic counselor was 45 minutes.  Quinn PlowmanKaren Remedy Corporan, MS,  Certified Genetic Counselor 12/20/2016

## 2016-12-21 ENCOUNTER — Other Ambulatory Visit (HOSPITAL_COMMUNITY): Payer: Self-pay | Admitting: *Deleted

## 2016-12-21 DIAGNOSIS — O285 Abnormal chromosomal and genetic finding on antenatal screening of mother: Secondary | ICD-10-CM

## 2017-01-02 ENCOUNTER — Encounter (HOSPITAL_COMMUNITY): Payer: Self-pay

## 2017-01-03 ENCOUNTER — Encounter (HOSPITAL_COMMUNITY): Payer: Self-pay | Admitting: Obstetrics and Gynecology

## 2017-01-14 ENCOUNTER — Other Ambulatory Visit (HOSPITAL_COMMUNITY): Payer: Self-pay | Admitting: Maternal & Fetal Medicine

## 2017-01-14 ENCOUNTER — Ambulatory Visit (HOSPITAL_COMMUNITY)
Admission: RE | Admit: 2017-01-14 | Discharge: 2017-01-14 | Disposition: A | Discharge status: Home / Self Care | Payer: Self-pay | Source: Ambulatory Visit | Attending: Obstetrics and Gynecology | Admitting: Obstetrics and Gynecology

## 2017-01-14 ENCOUNTER — Encounter (HOSPITAL_COMMUNITY): Payer: Self-pay

## 2017-01-14 DIAGNOSIS — O285 Abnormal chromosomal and genetic finding on antenatal screening of mother: Secondary | ICD-10-CM

## 2017-01-14 DIAGNOSIS — Z363 Encounter for antenatal screening for malformations: Secondary | ICD-10-CM | POA: Insufficient documentation

## 2017-01-14 DIAGNOSIS — Z3A16 16 weeks gestation of pregnancy: Secondary | ICD-10-CM

## 2017-01-14 DIAGNOSIS — O09522 Supervision of elderly multigravida, second trimester: Secondary | ICD-10-CM | POA: Insufficient documentation

## 2017-01-15 ENCOUNTER — Other Ambulatory Visit (HOSPITAL_COMMUNITY): Payer: Self-pay | Admitting: *Deleted

## 2017-01-15 DIAGNOSIS — IMO0002 Reserved for concepts with insufficient information to code with codable children: Secondary | ICD-10-CM

## 2017-01-15 DIAGNOSIS — Z0489 Encounter for examination and observation for other specified reasons: Secondary | ICD-10-CM

## 2017-02-08 ENCOUNTER — Encounter (HOSPITAL_COMMUNITY): Payer: Self-pay

## 2017-02-08 ENCOUNTER — Ambulatory Visit (HOSPITAL_COMMUNITY)
Admission: RE | Admit: 2017-02-08 | Discharge: 2017-02-08 | Disposition: A | Discharge status: Home / Self Care | Payer: Self-pay | Source: Ambulatory Visit | Attending: Obstetrics and Gynecology | Admitting: Obstetrics and Gynecology

## 2017-02-08 ENCOUNTER — Other Ambulatory Visit (HOSPITAL_COMMUNITY): Payer: Self-pay | Admitting: Maternal and Fetal Medicine

## 2017-02-08 DIAGNOSIS — O285 Abnormal chromosomal and genetic finding on antenatal screening of mother: Secondary | ICD-10-CM

## 2017-02-08 DIAGNOSIS — Z3A20 20 weeks gestation of pregnancy: Secondary | ICD-10-CM

## 2017-02-08 DIAGNOSIS — Z0489 Encounter for examination and observation for other specified reasons: Secondary | ICD-10-CM

## 2017-02-08 DIAGNOSIS — O09299 Supervision of pregnancy with other poor reproductive or obstetric history, unspecified trimester: Secondary | ICD-10-CM

## 2017-02-08 DIAGNOSIS — Z362 Encounter for other antenatal screening follow-up: Secondary | ICD-10-CM | POA: Insufficient documentation

## 2017-02-08 DIAGNOSIS — IMO0002 Reserved for concepts with insufficient information to code with codable children: Secondary | ICD-10-CM

## 2017-02-08 DIAGNOSIS — O09522 Supervision of elderly multigravida, second trimester: Secondary | ICD-10-CM

## 2017-02-08 DIAGNOSIS — O09292 Supervision of pregnancy with other poor reproductive or obstetric history, second trimester: Secondary | ICD-10-CM | POA: Insufficient documentation

## 2017-02-19 NOTE — L&D Delivery Note (Signed)
Delivery Note Patient pushed for less than 20 minutes after she was noted to be C/C/+2. At 6:02 PM a viable and healthy female was delivered via Vaginal, Spontaneous (Presentation: ROA).  APGAR: 7, 8; weight pending.  Baby was delivered through a nuchal cord. Shoulder and body easily  Delivered. Baby laid on maternal abdomen.  Delayed cord clamping was done.  Cord double clamped And cut by father.  Cord blood obtained.  Placenta spontaneously delivered intact 3 vessels. There was a notable blood clot along the posterior aspect of the placenta.  Uterine atony alleviated with massage and IV pitocin.  Second degree perineal laceration was repaired in routine fashion with 2-0 Vicryl and 3-0 chromic. Patient tolerated delivery well.     Anesthesia:  Epidural and 1% lidocaine plain Episiotomy: None Lacerations: 2nd degree;Perineal Suture Repair: 2.0 3.0 chromic vicryl Est. Blood Loss (mL):  500  Mom to postpartum.  Baby to Couplet care / Skin to Skin.  Essie HartINN, Kieryn Burtis STACIA 06/03/2017, 6:52 PM

## 2017-06-03 ENCOUNTER — Inpatient Hospital Stay (HOSPITAL_COMMUNITY)
Admission: AD | Admit: 2017-06-03 | Discharge: 2017-06-05 | DRG: 807 | Disposition: A | Discharge status: Home / Self Care | Payer: Self-pay | Source: Ambulatory Visit | Attending: Obstetrics & Gynecology | Admitting: Obstetrics & Gynecology

## 2017-06-03 ENCOUNTER — Inpatient Hospital Stay (HOSPITAL_COMMUNITY): Payer: Self-pay | Admitting: Anesthesiology

## 2017-06-03 ENCOUNTER — Encounter (HOSPITAL_COMMUNITY): Payer: Self-pay | Admitting: *Deleted

## 2017-06-03 DIAGNOSIS — Z87891 Personal history of nicotine dependence: Secondary | ICD-10-CM

## 2017-06-03 DIAGNOSIS — O135 Gestational [pregnancy-induced] hypertension without significant proteinuria, complicating the puerperium: Secondary | ICD-10-CM | POA: Diagnosis present

## 2017-06-03 DIAGNOSIS — Z3A36 36 weeks gestation of pregnancy: Secondary | ICD-10-CM

## 2017-06-03 DIAGNOSIS — O42913 Preterm premature rupture of membranes, unspecified as to length of time between rupture and onset of labor, third trimester: Principal | ICD-10-CM | POA: Diagnosis present

## 2017-06-03 DIAGNOSIS — K219 Gastro-esophageal reflux disease without esophagitis: Secondary | ICD-10-CM | POA: Diagnosis present

## 2017-06-03 DIAGNOSIS — O9962 Diseases of the digestive system complicating childbirth: Secondary | ICD-10-CM | POA: Diagnosis present

## 2017-06-03 DIAGNOSIS — Z880 Allergy status to penicillin: Secondary | ICD-10-CM

## 2017-06-03 DIAGNOSIS — O99824 Streptococcus B carrier state complicating childbirth: Secondary | ICD-10-CM | POA: Diagnosis present

## 2017-06-03 DIAGNOSIS — O42919 Preterm premature rupture of membranes, unspecified as to length of time between rupture and onset of labor, unspecified trimester: Secondary | ICD-10-CM | POA: Diagnosis present

## 2017-06-03 HISTORY — DX: Gestational (pregnancy-induced) hypertension without significant proteinuria, unspecified trimester: O13.9

## 2017-06-03 LAB — TYPE AND SCREEN
ABO/RH(D): A POS
ANTIBODY SCREEN: NEGATIVE

## 2017-06-03 LAB — COMPREHENSIVE METABOLIC PANEL
ALT: 15 U/L (ref 14–54)
ANION GAP: 14 (ref 5–15)
AST: 25 U/L (ref 15–41)
Albumin: 3 g/dL — ABNORMAL LOW (ref 3.5–5.0)
Alkaline Phosphatase: 133 U/L — ABNORMAL HIGH (ref 38–126)
BUN: 9 mg/dL (ref 6–20)
CHLORIDE: 105 mmol/L (ref 101–111)
CO2: 18 mmol/L — AB (ref 22–32)
CREATININE: 0.66 mg/dL (ref 0.44–1.00)
Calcium: 8.9 mg/dL (ref 8.9–10.3)
GFR calc Af Amer: >60 mL/min (ref >60)
Glucose, Bld: 88 mg/dL (ref 65–99)
POTASSIUM: 3.9 mmol/L (ref 3.5–5.1)
SODIUM: 137 mmol/L (ref 135–145)
Total Bilirubin: 0.7 mg/dL (ref 0.3–1.2)
Total Protein: 6.4 g/dL — ABNORMAL LOW (ref 6.5–8.1)

## 2017-06-03 LAB — PROTEIN / CREATININE RATIO, URINE
CREATININE, URINE: 149 mg/dL
PROTEIN CREATININE RATIO: 0.19 mg/mg{creat} — AB (ref 0.00–0.15)
Total Protein, Urine: 28 mg/dL

## 2017-06-03 LAB — CBC
HCT: 39.5 % (ref 36.0–46.0)
HEMATOCRIT: 37.8 % (ref 36.0–46.0)
HEMOGLOBIN: 13.6 g/dL (ref 12.0–15.0)
Hemoglobin: 14.3 g/dL (ref 12.0–15.0)
MCH: 32.8 pg (ref 26.0–34.0)
MCH: 32.8 pg (ref 26.0–34.0)
MCHC: 36.2 g/dL — AB (ref 30.0–36.0)
MCHC: 36 g/dL (ref 30.0–36.0)
MCV: 90.6 fL (ref 78.0–100.0)
MCV: 91.1 fL (ref 78.0–100.0)
Platelets: 274 10*3/uL (ref 150–400)
Platelets: 277 10*3/uL (ref 150–400)
RBC: 4.36 MIL/uL (ref 3.87–5.11)
RBC: 4.15 MIL/uL (ref 3.87–5.11)
RDW: 13.4 % (ref 11.5–15.5)
RDW: 13.4 % (ref 11.5–15.5)
WBC: 12.4 10*3/uL — ABNORMAL HIGH (ref 4.0–10.5)
WBC: 21.2 10*3/uL — ABNORMAL HIGH (ref 4.0–10.5)

## 2017-06-03 LAB — URIC ACID: URIC ACID, SERUM: 5.5 mg/dL (ref 2.3–6.6)

## 2017-06-03 LAB — LACTATE DEHYDROGENASE: LDH: 150 U/L (ref 98–192)

## 2017-06-03 LAB — POCT FERN TEST: POCT FERN TEST: POSITIVE

## 2017-06-03 LAB — ABO/RH: ABO/RH(D): A POS

## 2017-06-03 MED ORDER — ACETAMINOPHEN 325 MG PO TABS: 650.0000 mg | ORAL_TABLET | ORAL | Status: DC | PRN

## 2017-06-03 MED ORDER — PHENYLEPHRINE 40 MCG/ML (10ML) SYRINGE FOR IV PUSH (FOR BLOOD PRESSURE SUPPORT)
80.0000 ug | PREFILLED_SYRINGE | INTRAVENOUS | Status: DC | PRN
  Filled 2017-06-03: qty 10
  Filled 2017-06-03: qty 5

## 2017-06-03 MED ORDER — OXYTOCIN BOLUS FROM INFUSION
500.0000 mL | Freq: Once | INTRAVENOUS | Status: AC
  Administered 2017-06-03: 500 mL via INTRAVENOUS

## 2017-06-03 MED ORDER — EPHEDRINE 5 MG/ML INJ
10.0000 mg | INTRAVENOUS | Status: DC | PRN
  Filled 2017-06-03: qty 2

## 2017-06-03 MED ORDER — LIDOCAINE HCL (PF) 1 % IJ SOLN
30.0000 mL | INTRAMUSCULAR | Status: DC | PRN
  Administered 2017-06-03: 30 mL via SUBCUTANEOUS
  Filled 2017-06-03: qty 30

## 2017-06-03 MED ORDER — LIDOCAINE HCL (PF) 1 % IJ SOLN
INTRAMUSCULAR | Status: DC | PRN
  Administered 2017-06-03: 8 mL via EPIDURAL
  Administered 2017-06-03: 4 mL via EPIDURAL

## 2017-06-03 MED ORDER — ONDANSETRON HCL 4 MG/2ML IJ SOLN: 4.0000 mg | Freq: Four times a day (QID) | INTRAMUSCULAR | Status: DC | PRN

## 2017-06-03 MED ORDER — BETAMETHASONE SOD PHOS & ACET 6 (3-3) MG/ML IJ SUSP
12.0000 mg | Freq: Once | INTRAMUSCULAR | Status: AC
  Administered 2017-06-03: 12 mg via INTRAMUSCULAR
  Filled 2017-06-03: qty 2

## 2017-06-03 MED ORDER — OXYCODONE-ACETAMINOPHEN 5-325 MG PO TABS: 1.0000 | ORAL_TABLET | ORAL | Status: DC | PRN

## 2017-06-03 MED ORDER — OXYCODONE HCL 5 MG PO TABS: 5.0000 mg | ORAL_TABLET | ORAL | Status: DC | PRN

## 2017-06-03 MED ORDER — OXYCODONE HCL 5 MG PO TABS: 10.0000 mg | ORAL_TABLET | ORAL | Status: DC | PRN

## 2017-06-03 MED ORDER — LABETALOL HCL 5 MG/ML IV SOLN
20.0000 mg | INTRAVENOUS | Status: DC | PRN
  Administered 2017-06-03: 20 mg via INTRAVENOUS
  Filled 2017-06-03: qty 4

## 2017-06-03 MED ORDER — SENNOSIDES-DOCUSATE SODIUM 8.6-50 MG PO TABS
2.0000 | ORAL_TABLET | ORAL | Status: DC
  Administered 2017-06-04 (×2): 2 via ORAL
  Filled 2017-06-03 (×2): qty 2

## 2017-06-03 MED ORDER — FENTANYL CITRATE (PF) 100 MCG/2ML IJ SOLN: 50.0000 ug | INTRAMUSCULAR | Status: DC | PRN

## 2017-06-03 MED ORDER — PHENYLEPHRINE 40 MCG/ML (10ML) SYRINGE FOR IV PUSH (FOR BLOOD PRESSURE SUPPORT)
80.0000 ug | PREFILLED_SYRINGE | INTRAVENOUS | Status: DC | PRN
  Filled 2017-06-03: qty 5

## 2017-06-03 MED ORDER — VANCOMYCIN HCL IN DEXTROSE 1-5 GM/200ML-% IV SOLN
1000.0000 mg | Freq: Two times a day (BID) | INTRAVENOUS | Status: DC
  Administered 2017-06-03: 1000 mg via INTRAVENOUS
  Filled 2017-06-03 (×6): qty 200

## 2017-06-03 MED ORDER — WITCH HAZEL-GLYCERIN EX PADS: 1.0000 "application " | MEDICATED_PAD | CUTANEOUS | Status: DC | PRN

## 2017-06-03 MED ORDER — FENTANYL 2.5 MCG/ML BUPIVACAINE 1/10 % EPIDURAL INFUSION (WH - ANES)
14.0000 mL/h | INTRAMUSCULAR | Status: DC | PRN
  Administered 2017-06-03: 14 mL/h via EPIDURAL
  Filled 2017-06-03: qty 100

## 2017-06-03 MED ORDER — ZOLPIDEM TARTRATE 5 MG PO TABS: 5.0000 mg | ORAL_TABLET | Freq: Every evening | ORAL | Status: DC | PRN

## 2017-06-03 MED ORDER — OXYTOCIN 40 UNITS IN LACTATED RINGERS INFUSION - SIMPLE MED: 2.5000 [IU]/h | INTRAVENOUS | Status: DC

## 2017-06-03 MED ORDER — PRENATAL MULTIVITAMIN CH
1.0000 | ORAL_TABLET | Freq: Every day | ORAL | Status: DC
  Administered 2017-06-04: 1 via ORAL
  Filled 2017-06-03: qty 1

## 2017-06-03 MED ORDER — LACTATED RINGERS IV SOLN: 500.0000 mL | INTRAVENOUS | Status: DC | PRN

## 2017-06-03 MED ORDER — OXYTOCIN 40 UNITS IN LACTATED RINGERS INFUSION - SIMPLE MED: 2.5000 [IU]/h | INTRAVENOUS | Status: DC | PRN

## 2017-06-03 MED ORDER — SIMETHICONE 80 MG PO CHEW
80.0000 mg | CHEWABLE_TABLET | ORAL | Status: DC | PRN
  Administered 2017-06-04: 80 mg via ORAL
  Filled 2017-06-03 (×2): qty 1

## 2017-06-03 MED ORDER — TETANUS-DIPHTH-ACELL PERTUSSIS 5-2.5-18.5 LF-MCG/0.5 IM SUSP: 0.5000 mL | Freq: Once | INTRAMUSCULAR | Status: DC

## 2017-06-03 MED ORDER — SOD CITRATE-CITRIC ACID 500-334 MG/5ML PO SOLN: 30.0000 mL | ORAL | Status: DC | PRN

## 2017-06-03 MED ORDER — HYDRALAZINE HCL 20 MG/ML IJ SOLN: 10.0000 mg | Freq: Once | INTRAMUSCULAR | Status: DC | PRN

## 2017-06-03 MED ORDER — OXYCODONE-ACETAMINOPHEN 5-325 MG PO TABS: 2.0000 | ORAL_TABLET | ORAL | Status: DC | PRN

## 2017-06-03 MED ORDER — OXYTOCIN 40 UNITS IN LACTATED RINGERS INFUSION - SIMPLE MED
1.0000 m[IU]/min | INTRAVENOUS | Status: DC
  Administered 2017-06-03: 2 m[IU]/min via INTRAVENOUS
  Filled 2017-06-03: qty 1000

## 2017-06-03 MED ORDER — DIPHENHYDRAMINE HCL 25 MG PO CAPS: 25.0000 mg | ORAL_CAPSULE | Freq: Four times a day (QID) | ORAL | Status: DC | PRN

## 2017-06-03 MED ORDER — TERBUTALINE SULFATE 1 MG/ML IJ SOLN
0.2500 mg | Freq: Once | INTRAMUSCULAR | Status: DC | PRN
  Filled 2017-06-03: qty 1

## 2017-06-03 MED ORDER — COCONUT OIL OIL: 1.0000 "application " | TOPICAL_OIL | Status: DC | PRN

## 2017-06-03 MED ORDER — ONDANSETRON HCL 4 MG PO TABS: 4.0000 mg | ORAL_TABLET | ORAL | Status: DC | PRN

## 2017-06-03 MED ORDER — BENZOCAINE-MENTHOL 20-0.5 % EX AERO
1.0000 "application " | INHALATION_SPRAY | CUTANEOUS | Status: DC | PRN
  Administered 2017-06-03: 1 via TOPICAL
  Filled 2017-06-03: qty 56

## 2017-06-03 MED ORDER — DIBUCAINE 1 % RE OINT: 1.0000 "application " | TOPICAL_OINTMENT | RECTAL | Status: DC | PRN

## 2017-06-03 MED ORDER — LACTATED RINGERS IV SOLN
INTRAVENOUS | Status: DC
  Administered 2017-06-03 (×2): via INTRAVENOUS

## 2017-06-03 MED ORDER — IBUPROFEN 600 MG PO TABS
600.0000 mg | ORAL_TABLET | Freq: Four times a day (QID) | ORAL | Status: DC
  Administered 2017-06-03 – 2017-06-05 (×7): 600 mg via ORAL
  Filled 2017-06-03 (×6): qty 1

## 2017-06-03 MED ORDER — ONDANSETRON HCL 4 MG/2ML IJ SOLN: 4.0000 mg | INTRAMUSCULAR | Status: DC | PRN

## 2017-06-03 MED ORDER — LACTATED RINGERS IV SOLN
500.0000 mL | Freq: Once | INTRAVENOUS | Status: AC
  Administered 2017-06-03: 500 mL via INTRAVENOUS

## 2017-06-03 MED ORDER — DIPHENHYDRAMINE HCL 50 MG/ML IJ SOLN: 12.5000 mg | INTRAMUSCULAR | Status: DC | PRN

## 2017-06-03 NOTE — MAU Note (Signed)
Pt sent from MD office, started leaking fluid around 0630 this morning, clear.  Had appointment already for BP, BP has been elevated since last week.  Having irregular contractions, has been spotting since cervical exam in office.  Reports good fetal movement.  Denies HA, visual changes or epigastric pain.

## 2017-06-03 NOTE — Anesthesia Preprocedure Evaluation (Addendum)
Anesthesia Evaluation  Patient identified by MRN, date of birth, ID band Patient awake    Reviewed: Allergy & Precautions, NPO status , Patient's Chart, lab work & pertinent test results  Airway Mallampati: II  TM Distance: >3 FB Neck ROM: Full    Dental   Pulmonary former smoker,    Pulmonary exam normal breath sounds clear to auscultation       Cardiovascular hypertension, Normal cardiovascular exam Rhythm:Regular Rate:Normal  Inc RBBB   Neuro/Psych  Headaches, Anxiety Depression    GI/Hepatic Neg liver ROS, GERD  Medicated and Controlled,  Endo/Other  negative endocrine ROS  Renal/GU negative Renal ROS  negative genitourinary   Musculoskeletal negative musculoskeletal ROS (+)   Abdominal   Peds  Hematology negative hematology ROS (+)   Anesthesia Other Findings   Reproductive/Obstetrics (+) Pregnancy Pre-eclampsia                            Anesthesia Physical Anesthesia Plan  ASA: II  Anesthesia Plan: Epidural   Post-op Pain Management:    Induction:   PONV Risk Score and Plan:   Airway Management Planned: Natural Airway  Additional Equipment:   Intra-op Plan:   Post-operative Plan:   Informed Consent: I have reviewed the patients History and Physical, chart, labs and discussed the procedure including the risks, benefits and alternatives for the proposed anesthesia with the patient or authorized representative who has indicated his/her understanding and acceptance.     Plan Discussed with:   Anesthesia Plan Comments: (Labs reviewed. Platelets acceptable, patient not taking any blood thinning medications. Risks and benefits discussed with patient, patient expressed understanding and wished to proceed.)        Anesthesia Quick Evaluation

## 2017-06-03 NOTE — MAU Note (Signed)
Urine in lab 

## 2017-06-03 NOTE — Progress Notes (Signed)
S: Called by RN stating pt BP high at 167/104.  Pt denies headache or blurred vision.  Pre eclamptic work up negative with PCR negative. O:  BP range 160-140/90 to 100s.  Reflexes +2/+2 A: PP with gestational hypertension P: Given one dose IV labetalol with repeat BP 149/102.  Will monitor BP.

## 2017-06-03 NOTE — H&P (Signed)
[name] is a 42 y.o. female presenting for rupture of membranes at 06:30 this morning, clear fluid.  She is also having irregular ctx, no vaginal bleeding.  She reports normal fetal movement.  She denies HA, blurry vision, no RUQ pain, no scotomata.   Pregnancy complicated by AMA NIPS (panorama) hi risk for T18 or T13. MFM ultrasounds all normal  OB History    Gravida  3   Para  1   Term  1   Preterm      AB  1   Living  1     SAB      TAB  1   Ectopic      Multiple      Live Births  1          Past Medical History:  Diagnosis Date  . Abnormal Pap smear   . Anxiety   . Breast mass    left  . Depression   . GERD (gastroesophageal reflux disease)   . Headache(784.0)    migraines  . History of miscarriage   . Incomplete RBBB   . Palpitations   . Pregnancy induced hypertension   . Syncope and collapse   . Tachycardia   . Vaginal Pap smear, abnormal    Past Surgical History:  Procedure Laterality Date  . BREAST ENHANCEMENT SURGERY  2012  . CHOLECYSTECTOMY    . DILATION AND CURETTAGE OF UTERUS  2008  . LIPOMA EXCISION  2010   left leg  . 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 US ECHOCARDIOGRAPHY  06/17/2007   EF 55-60%   Family History: family history includes COPD in her maternal grandmother and mother; Crohn's disease in her brother; Diabetes in her maternal grandmother; Heart attack in her father; Heart disease in her father and mother; Hyperlipidemia in her mother; Hypertension in her father and mother. Social History:  reports that she has quit smoking. Her smoking use included cigarettes. She smoked 0.25 packs per day. She has never used smokeless tobacco. She reports that she does not drink alcohol or use drugs.     Maternal Diabetes: No Genetic Screening: Abnormal:  Results: Elevated risk of Trisomy 18 Maternal Ultrasounds/Referrals: Normal Fetal Ultrasounds or other Referrals:  Referred to  Materal Fetal Medicine  Maternal Substance Abuse:  No Significant Maternal Medications:  None Significant Maternal Lab Results:  Lab values include: Group B Strep positive Other Comments:  None  Review of Systems  Constitutional: Negative.   Eyes: Negative for blurred vision and double vision.  Neurological: Negative for dizziness and headaches.   Maternal Medical History:  Reason for admission: Rupture of membranes.   Contractions: Onset was less than 1 hour ago.   Frequency: rare.   Perceived severity is mild.    Fetal activity: Perceived fetal activity is normal.   Last perceived fetal movement was within the past hour.    Prenatal complications: no prenatal complications Prenatal Complications - Diabetes: none.    Dilation: 2 Effacement (%): Thick Station: Ballotable Exam by:: J.Thornton, rn  Blood pressure (!) 143/96, pulse (!) 138, temperature 98.6 F (37 C), temperature source Oral, resp. rate 20, height 5\' 6"  (1.676 m), weight 85.3 kg (188 lb), last menstrual period 09/20/2016. Maternal Exam:  Uterine Assessment: Contraction strength is mild.  Contraction frequency is rare.   Abdomen: Patient reports no abdominal tenderness. Fundal height is 36.   Estimated fetal weight is 2500 grams.   Fetal presentation:  vertex  Introitus: Normal vulva. Normal vagina.  Ferning test: positive.  Nitrazine test: positive. Amniotic fluid character: clear.  Pelvis: adequate for delivery.   Cervix: Cervix evaluated by digital exam.   2cm in office by Dr. Estanislado Pandy  Fetal Exam Fetal Monitor Review: Baseline rate: 145.  Variability: moderate (6-25 bpm).   Pattern: no decelerations and accelerations present.    Fetal State Assessment: Category I - tracings are normal.     Physical Exam  Nursing note and vitals reviewed. Constitutional: She appears well-developed and well-nourished.  Neurological: She has normal reflexes.    Prenatal labs: ABO, Rh: A/Positive/-- (09/04  0000) Antibody: Negative (09/04 0000) Rubella: Immune (09/04 0000) RPR: Nonreactive (09/04 0000)  HBsAg: Negative (09/04 0000)  HIV: Non-reactive (09/04 0000)  GBS:   POSITIVE  Assessment/Plan: 42 year old G3P1 with PPROM at 36 weeks 4 days Admit to L&D Pitocin for induction of labor Continuous monitoring Epidural on demand Elevated BPs - pre E workup (PIH labs / protein: Creat ratio) Betamethasone for FLM Vancomycin for GBS positive status allergy to PCN and No sensitivities.    [name] 06/03/2017, 1:36 PM

## 2017-06-03 NOTE — Anesthesia Pain Management Evaluation Note (Signed)
  CRNA Pain Management Visit Note  Patient: [name], 42 y.o., female  "Hello I am a member of the anesthesia team at [facility]. We have an anesthesia team available at all times to provide care throughout the hospital, including epidural management and anesthesia for C-section. I don't know your plan for the delivery whether it a natural birth, water birth, IV sedation, nitrous supplementation, doula or epidural, but we want to meet your pain goals."   1.Was your pain managed to your expectations on prior hospitalizations?   Yes   2.What is your expectation for pain management during this hospitalization?     Epidural  3.How can we help you reach that goal? Epidural @ pain goal.  Record the patient's initial score and the patient's pain goal.   Pain: 2  Pain Goal: 5 [facility] wants you to be able to say your pain was always managed very well.  Heavyn Yearsley 06/03/2017

## 2017-06-03 NOTE — Anesthesia Procedure Notes (Signed)
Epidural Patient location during procedure: OB Start time: 06/03/2017 4:44 PM End time: 06/03/2017 4:48 PM  Staffing Anesthesiologist: Beryle LatheBrock, Lavonna Lampron E, MD Performed: anesthesiologist   Preanesthetic Checklist Completed: patient identified, pre-op evaluation, timeout performed, IV checked, risks and benefits discussed and monitors and equipment checked  Epidural Patient position: sitting Prep: DuraPrep Patient monitoring: continuous pulse ox and blood pressure Approach: midline Location: L2-L3 Injection technique: LOR saline  Needle:  Needle type: Tuohy  Needle gauge: 17 G Needle length: 9 cm Needle insertion depth: 4.5 cm Catheter size: 19 Gauge Catheter at skin depth: 10 cm Test dose: negative and Other (1% lidocaine)  Additional Notes Patient identified. Risks including, but not limited to, bleeding, infection, nerve damage, paralysis, inadequate analgesia, blood pressure changes, nausea, vomiting, allergic reaction, postpartum back pain, itching, and headache were discussed. Patient expressed understanding and wished to proceed. Sterile prep and drape, including hand hygiene, mask, and sterile gloves were used. The patient was positioned and the spine was prepped. The skin was anesthetized with lidocaine. No paraesthesia or other complication noted. The patient did not experience any signs of intravascular injection such as tinnitus or metallic taste in mouth, nor signs of intrathecal spread such as rapid motor block. Please see nursing notes for vital signs. The patient tolerated the procedure well.   Leslye Peerhomas Lamarcus Spira, MDReason for block:procedure for pain

## 2017-06-03 NOTE — Progress Notes (Signed)
MD progress note - patient seen and examined at bedside  [name] is a 42 y.o. G3P1011 at [redacted]w[redacted]d by LMP admitted for rupture of membranes, Preterm labor  Subjective: Patient feels contractions, but not in pain yet  Objective: BP (!) 143/96   Pulse (!) 138   Temp 98.6 F (37 C) (Oral)   Resp 20   Ht  (1.676 m)   Wt 85.3 kg (188 lb)   LMP 09/20/2016   BMI 30.34 kg/m  No intake/output data recorded. No intake/output data recorded.  FHT:  FHR: 145 bpm, variability: moderate,  accelerations:  Present,  decelerations:  Absent UC:   irregular, every 5-8 minutes SVE:   Dilation: 2 Effacement (%): Thick Station: Ballotable Exam by:: J.Thornton, rn   Labs: Lab Results  Component Value Date   WBC 12.4 (H) 06/03/2017   HGB 14.3 06/03/2017   HCT 39.5 06/03/2017   MCV 90.6 06/03/2017   PLT 274 06/03/2017    Assessment / Plan: Induction of labor due to PPROM Will continue to increase pitocin BPs mild range, PIH labs WNL, protein / creatinine ratio pending Labor: Early phase Preeclampsia:  no signs or symptoms of toxicity Fetal Wellbeing:  Category I Pain Control:  Labor support without medications I/D:  n/a Anticipated MOD:  NSVD  [name] 06/03/2017, 1:34 PM

## 2017-06-04 LAB — COMPREHENSIVE METABOLIC PANEL
ALK PHOS: 111 U/L (ref 38–126)
ALT: 16 U/L (ref 14–54)
AST: 34 U/L (ref 15–41)
Albumin: 2.8 g/dL — ABNORMAL LOW (ref 3.5–5.0)
Anion gap: 11 (ref 5–15)
BILIRUBIN TOTAL: 0.8 mg/dL (ref 0.3–1.2)
BUN: 7 mg/dL (ref 6–20)
CALCIUM: 8.8 mg/dL — AB (ref 8.9–10.3)
CO2: 20 mmol/L — ABNORMAL LOW (ref 22–32)
CREATININE: 0.63 mg/dL (ref 0.44–1.00)
Chloride: 103 mmol/L (ref 101–111)
Glucose, Bld: 98 mg/dL (ref 65–99)
Potassium: 4.6 mmol/L (ref 3.5–5.1)
SODIUM: 134 mmol/L — AB (ref 135–145)
TOTAL PROTEIN: 5.6 g/dL — AB (ref 6.5–8.1)

## 2017-06-04 LAB — RPR: RPR Ser Ql: NONREACTIVE

## 2017-06-04 LAB — CBC
HEMATOCRIT: 34.5 % — AB (ref 36.0–46.0)
HEMOGLOBIN: 12.3 g/dL (ref 12.0–15.0)
MCH: 32.8 pg (ref 26.0–34.0)
MCHC: 35.7 g/dL (ref 30.0–36.0)
MCV: 92 fL (ref 78.0–100.0)
Platelets: 250 10*3/uL (ref 150–400)
RBC: 3.75 MIL/uL — AB (ref 3.87–5.11)
RDW: 13.6 % (ref 11.5–15.5)
WBC: 18.5 10*3/uL — ABNORMAL HIGH (ref 4.0–10.5)

## 2017-06-04 MED ORDER — SERTRALINE HCL 100 MG PO TABS
100.0000 mg | ORAL_TABLET | Freq: Every day | ORAL | Status: DC
  Administered 2017-06-04 – 2017-06-05 (×2): 100 mg via ORAL
  Filled 2017-06-04 (×3): qty 1

## 2017-06-04 MED ORDER — LABETALOL HCL 200 MG PO TABS
200.0000 mg | ORAL_TABLET | Freq: Two times a day (BID) | ORAL | Status: DC
  Administered 2017-06-04 – 2017-06-05 (×2): 200 mg via ORAL
  Filled 2017-06-04 (×2): qty 1

## 2017-06-04 NOTE — Lactation Note (Signed)
This note was copied from a baby's chart. Lactation Consultation Note  Patient Name: [name] ZOXWR'UToday's Date: 06/04/2017 Reason for consult: Initial assessment;Breast augmentation;Late-preterm 34-36.6wks;Infant < 6lbs;Other (Comment)(Breastfed her 40 year old for short time but NICU issues prevented from bonding well initially)  LC Initial Visit:  G3P2 mother whose infant is a LPTI and <6 pounds now.  Mother has a son who is 524 years old whom she breastfed some but had trouble initially due to the child being in the NICU.  Reviewed lactation services with mother and offered to assist with latch.  Mother accepted assistance and did not seem to understand the importance of waking baby to feed at least every 3 hours or sooner if she shows feeding cues.  Reviewed the LPTI policy with mother and the supplementation feeding amounts with mother.  Mother has a DEBP at bedside but stated that she had only pumped once since birth.  LC reviewed the importance of pumping every 3 hours and to do breast massage and hand expression.  Demonstrated hand expression and hand mother do a return demonstration.  Colostrum drops noted from each breast.    Mother prefers the cross cradle position and LC attempted many times to get the infant to open wide prior to latch.  Baby has a small mouth and did not want to open wide.  LC stressed the importance of this but mother wants to constantly try to force her nipple into baby's mouth.  LC explained that the latch will not be effective and the nipple will hurt if not latched correctly.  Infant finally able to latch and did take a few sucks.  Repeated attempts necessary to continue sucking.  After 20 minutes LC suggested infant take the supplement and mother could pump.  Mother unfamiliar with paced bottle feeding so LC demonstrated to mother, son and grandmother what this was.  Infant with flanged lips and effective suck on bottle nipple.  Reviewed burping with family  and infant content and awake after taking 18 mls of [employer] Good Start.  Taught family how to swaddle and explained baby needed her hat on.  Brother is holding infant with grandmother supervising while mother pumps.    Mom made aware of O/P services, breastfeeding support groups, community resources, and our phone # for post-discharge questions.     Maternal Data Has patient been taught Hand Expression?: Yes Does the patient have breastfeeding experience prior to this delivery?: Yes  Feeding Feeding Type: Bottle Fed - Breast Milk Length of feed: 20 min(Much assistance needed1)  LATCH Score Latch: Repeated attempts needed to sustain latch, nipple held in mouth throughout feeding, stimulation needed to elicit sucking reflex.  Audible Swallowing: None  Type of Nipple: Everted at rest and after stimulation  Comfort (Breast/Nipple): Soft / non-tender  Hold (Positioning): Assistance needed to correctly position infant at breast and maintain latch.  LATCH Score: 6  Interventions Interventions: Breast feeding basics reviewed;Assisted with latch;Skin to skin;Breast massage;Hand express;Position options;Support pillows;Adjust position;Breast compression;DEBP  Lactation Tools Discussed/Used WIC Program: No   Consult Status Consult Status: Follow-up Date: 06/05/17 Follow-up type: In-patient    [name] [name] 06/04/2017, 1:16 PM

## 2017-06-04 NOTE — Progress Notes (Signed)
Subjective: Postpartum Day 1: Vaginal delivery, 2nd laceration Patient up ad lib, reports no syncope or dizziness. Feeding:  breastfeeding Contraceptive plan:  undecided  Objective: Vital signs in last 24 hours: Temp:  [97.7 F (36.5 C)-99.6 F (37.6 C)] 98.1 F (36.7 C) (04/16 0548) Pulse Rate:  [120-169] 120 (04/16 0548) Resp:  [16-20] 16 (04/16 0548) BP: (120-167)/(64-114) 149/99 (04/16 0548) SpO2:  [97 %-99 %] 97 % (04/15 2002) Weight:  [85.3 kg (188 lb)] 85.3 kg (188 lb) (04/15 1058)  Physical Exam:  General: alert, cooperative and no distress Lochia: appropriate Uterine Fundus: firm Perineum: healing well DVT Evaluation: No evidence of DVT seen on physical exam. Negative Homan's sign.   CBC Latest Ref Rng & Units 06/04/2017 06/03/2017 06/03/2017  WBC 4.0 - 10.5 K/uL 18.5(H) 21.2(H) 12.4(H)  Hemoglobin 12.0 - 15.0 g/dL 82.912.3 56.213.6 13.014.3  Hematocrit 36.0 - 46.0 % 34.5(L) 37.8 39.5  Platelets 150 - 400 K/uL 250 277 274     Assessment/Plan: Status post vaginal delivery day 1. Stable Continue current care. Plan for discharge tomorrow and Breastfeeding  Will restart Zoloft 100mg  PO    Henderson Newcomerancy Jean ProtheroCNM 06/04/2017, 8:35 AM

## 2017-06-05 MED ORDER — IBUPROFEN 600 MG PO TABS: 600.0000 mg | ORAL_TABLET | Freq: Four times a day (QID) | ORAL | 0 refills | Status: DC

## 2017-06-05 MED ORDER — SERTRALINE HCL 100 MG PO TABS: 100.0000 mg | ORAL_TABLET | Freq: Every day | ORAL | 6 refills | Status: DC

## 2017-06-05 MED ORDER — LABETALOL HCL 200 MG PO TABS: 200.0000 mg | ORAL_TABLET | Freq: Two times a day (BID) | ORAL | 2 refills | Status: DC

## 2017-06-05 NOTE — Anesthesia Postprocedure Evaluation (Signed)
Anesthesia Post Note  Patient: [name]  Procedure(s) Performed: AN AD HOC LABOR EPIDURAL     Patient location during evaluation: Mother Baby Anesthesia Type: Epidural Level of consciousness: awake and alert and oriented Pain management: satisfactory to patient Vital Signs Assessment: post-procedure vital signs reviewed and stable Respiratory status: spontaneous breathing and nonlabored ventilation Cardiovascular status: stable Postop Assessment: no headache, no backache, no signs of nausea or vomiting, adequate PO intake and patient able to bend at knees (patient up walking) Anesthetic complications: no    Last Vitals:  Vitals:   06/05/17 0552 06/05/17 1046  BP: (!) 148/96 (!) 145/99  Pulse: 99 (!) 112  Resp: 20   Temp: 36.5 C   SpO2: 99%     Last Pain:  Vitals:   06/05/17 0845  TempSrc:   PainSc: 0-No pain   Pain Goal:                 Madison HickmanGREGORY,Gareld Obrecht

## 2017-06-05 NOTE — Discharge Summary (Signed)
OB Discharge Summary     Patient Name: [name] DOB: 04-29-1975 MRN: [id number]  Date of admission: 06/03/2017 Delivering MD: Essie Hart   Date of discharge: 06/05/2017  Admitting diagnosis: 36wks water broke ctx Intrauterine pregnancy: [redacted]w[redacted]d     Secondary diagnosis:  Active Problems:   Preterm premature rupture of membranes  Additional problems: gestational hypertension     Discharge diagnosis: Term Pregnancy Delivered                                                                                                Post partum procedures:None  Augmentation: N/A  Complications: None  Hospital course:  Onset of Labor With Vaginal Delivery     42 y.o. yo [id number] at [redacted]w[redacted]d was admitted in Active Labor on 06/03/2017. Patient had an uncomplicated labor course as follows:  Membrane Rupture Time/Date: 6:30 AM ,06/03/2017   Intrapartum Procedures: Episiotomy: None [1]                                         Lacerations:  2nd degree [3];Perineal [11]  Patient had a delivery of a Viable infant. 06/03/2017  Information for the patient's newborn:  Aylana, [place] [[id number]]  Delivery Method: Vaginal, Spontaneous(Filed from Delivery Summary)    Pateint had an uncomplicated postpartum course.  She is ambulating, tolerating a regular diet, passing flatus, and urinating well. Patient is discharged home in stable condition on 06/05/17.   Physical exam  Vitals:   06/04/17 2035 06/04/17 2106 06/04/17 2240 06/05/17 0552  BP: (!) 161/106 (!) 151/106 (!) 138/91 (!) 148/96  Pulse: (!) 121 (!) 116 (!) 111 99  Resp:  18  20  Temp:    97.7 F (36.5 C)  TempSrc:      SpO2:    99%  Weight:      Height:       General: alert, cooperative and no distress Lochia: appropriate Uterine Fundus: firm Incision: N/A DVT Evaluation: No evidence of DVT seen on physical exam. Negative Homan's sign. Labs: Lab Results  Component Value Date   WBC 18.5 (H) 06/04/2017   HGB 12.3 06/04/2017   HCT 34.5 (L) 06/04/2017   MCV 92.0 06/04/2017   PLT 250 06/04/2017   CMP Latest Ref Rng & Units 06/04/2017  Glucose 65 - 99 mg/dL 98  BUN 6 - 20 mg/dL 7  Creatinine 1.30 - 8.65 mg/dL 7.84  Sodium 696 - 295 mmol/L 134(L)  Potassium 3.5 - 5.1 mmol/L 4.6  Chloride 101 - 111 mmol/L 103  CO2 22 - 32 mmol/L 20(L)  Calcium 8.9 - 10.3 mg/dL 2.8(U)  Total Protein 6.5 - 8.1 g/dL 1.3(K)  Total Bilirubin 0.3 - 1.2 mg/dL 0.8  Alkaline Phos 38 - 126 U/L 111  AST 15 - 41 U/L 34  ALT 14 - 54 U/L 16    Discharge instruction: per After Visit Summary and "Baby and Me Booklet".  After visit meds:  Allergies as of 06/05/2017      Reactions  Amoxicillin Hives   Has patient had a PCN reaction causing immediate rash, facial/tongue/throat swelling, SOB or lightheadedness with hypotension: {Yes/No/Unknown:[id number] Has patient had a PCN reaction causing severe rash involving mucus membranes or skin necrosis: Unknown Has patient had a PCN reaction that required hospitalization: Unknown Has patient had a PCN reaction occurring within the last 10 years: Unknown If all of the above answers are "NO", then may proceed with Cephalosporin use.   Bactrim Nausea And Vomiting   Erythromycin Other (See Comments)   Childhood reaction   Sulfa Drugs Cross Reactors Nausea And Vomiting      Medication List    STOP taking these medications   FOLIC ACID PO     TAKE these medications   ibuprofen 600 MG tablet Commonly known as:  ADVIL,MOTRIN Take 1 tablet (600 mg total) by mouth every 6 (six) hours.   labetalol 200 MG tablet Commonly known as:  NORMODYNE Take 1 tablet (200 mg total) by mouth 2 (two) times daily.   PNV PO Take by mouth.   sertraline 100 MG tablet Commonly known as:  ZOLOFT Take 1 tablet (100 mg total) by mouth daily.   ZANTAC PO Take 75 mg by mouth daily as needed (heartburn).       Diet: routine diet  Activity: Advance as tolerated. Pelvic rest for 6 weeks.   Outpatient follow  up:1 week for BP check Follow up Appt:No future appointments. Follow up Visit:No follow-ups on file.  Postpartum contraception: Vasectomy  Newborn Data: Live born female  Birth Weight: 6 lb 1.7 oz (2770 g) APGAR: 7, 8  Newborn Delivery   Birth date/time:  06/03/2017 18:02:00 Delivery type:  Vaginal, Spontaneous     Baby Feeding: Bottle and Breast Disposition:home with mother   06/05/2017 Kenney HousemanNancy Jean Harlan Vinal, CNM

## 2017-06-05 NOTE — Lactation Note (Signed)
This note was copied from a baby's chart. Lactation Consultation Note  Patient Name: [name] [name]: 06/05/2017 Reason for consult: Follow-up assessment;Late-preterm 34-36.6wks;Infant < 6lbs  LC Visit Prior to Discharge:  Infant latched onto right breast in cross cradle hold.  Mother states baby is latching better since yesterday and she is supplementing per plan.  Infant is 36+4 weeks and < 6 lbs.  Mother understands the importance of waking baby and supplementing; reviewed this yesterday.  She seems more relaxed today and allowed time for questions.  Grandmother is present and very helpful and supportive.  Engorgement prevention/treatment reviewed. Mom made aware of O/P services, breastfeeding support groups, community resources, and our phone # for post-discharge questions.   Maternal Data Formula Feeding for Exclusion: No Has patient been taught Hand Expression?: Yes Does the patient have breastfeeding experience prior to this delivery?: Yes  Feeding Feeding Type: Breast Fed Length of feed: 10 min(still feeding)  LATCH Score Latch: Grasps breast easily, tongue down, lips flanged, rhythmical sucking.([facility])  Audible Swallowing: Spontaneous and intermittent  Type of Nipple: Everted at rest and after stimulation  Comfort (Breast/Nipple): Soft / non-tender  Hold (Positioning): Assistance needed to correctly position infant at breast and maintain latch.  LATCH Score: 9  Interventions Interventions: Breast feeding basics reviewed;Skin to skin;Breast massage;Position options;Support pillows;Adjust position  Lactation Tools Discussed/Used Tools: Pump Breast pump type: Double-Electric Breast Pump;Manual   Consult Status Consult Status: Complete [name]: 06/05/17 Follow-up type: Call as needed    [name] [name] R [name] [name] 06/05/2017, 9:29 AM

## 2017-12-30 ENCOUNTER — Other Ambulatory Visit: Payer: Self-pay | Admitting: Nurse Practitioner

## 2017-12-30 DIAGNOSIS — Z1231 Encounter for screening mammogram for malignant neoplasm of breast: Secondary | ICD-10-CM

## 2019-07-06 ENCOUNTER — Ambulatory Visit (INDEPENDENT_AMBULATORY_CARE_PROVIDER_SITE_OTHER): Payer: PRIVATE HEALTH INSURANCE | Admitting: Internal Medicine

## 2019-07-06 ENCOUNTER — Encounter: Payer: Self-pay | Admitting: Internal Medicine

## 2019-07-06 ENCOUNTER — Other Ambulatory Visit: Payer: Self-pay

## 2019-07-06 VITALS — BP 144/94 | HR 109 | Ht 67.0 in | Wt 181.4 lb

## 2019-07-06 DIAGNOSIS — R002 Palpitations: Secondary | ICD-10-CM | POA: Diagnosis not present

## 2019-07-06 NOTE — Patient Instructions (Signed)
Medication Instructions:  Your physician recommends that you continue on your current medications as directed. Please refer to the Current Medication list given to you today.  *If you need a refill on your cardiac medications before your next appointment, please call your pharmacy*   Follow-Up: At [facility], you and your health needs are our priority.  As part of our continuing mission to provide you with exceptional heart care, we have created designated Provider Care Teams.  These Care Teams include your primary Cardiologist (physician) and Advanced Practice Providers (APPs -  Physician Assistants and Nurse Practitioners) who all work together to provide you with the care you need, when you need it.  We recommend signing up for the patient portal called "MyChart".  Sign up information is provided on this After Visit Summary.  MyChart is used to connect with patients for Virtual Visits (Telemedicine).  Patients are able to view lab/test results, encounter notes, upcoming appointments, etc.  Non-urgent messages can be sent to your provider as well.   To learn more about what you can do with MyChart, go to [email or web address].    Your next appointment:   AS NEEDED  Provider:   You may see Dr. Zoila Shutter or one of the following Advanced Practice Providers on your designated Care Team:    Azalee Course, PA-C  Micah Flesher, [place] or   Judy Pimple, [place]    Other Instructions

## 2019-07-06 NOTE — Progress Notes (Signed)
07/06/2019 Nedra Hai   1975-05-31  [id number]  Primary Physician Hamrick, Lorin Mercy, MD Primary Cardiologist: Dr. Martinique  CC: Palpitations  HPI:  Mrs. Reek presents to clinic today for evaluation of palpitations. She is a 44 y/o female who has been seen in the past by Dr. Martinique. Her last office visit was with Truitt Merle, NP, in April of this year.   She has a history of palpitations, SVT/tachycardia and has been on beta blocker therapy with metoprolol. Prior cardiac assessments have included an echocardiogram in 2009 which was a normal study with normal LV function. She has never had a stress test. She reports a strong family history of CAD/sudden cardiac death. Her paternal grandfather died suddenly at the age of 27 ( was told that this was cardiac related/likely MI). Her father also has coronary disease and has suffered 2 myocardial infarctions, the first in his 56s. She denies any personal history of tobacco abuse. She has no diagnoses of diabetes. She does not recall undergoing fasting laboratory work for lipid studies in the past. She also admits that she has a history of anxiety. This was made more pronounced 8 years ago after she and her husband suffered the loss of a child. She is followed by a psychiatrist, has undergone counseling and has also been on medications for anxiety/depression.  Her main concern today is increased frequency of palpitations. She is concerned that this may not all be anxiety related and she presents for evaluation for assessment to rule out cardiac etiologies, given her strong family history of premature cardiovascular disease.  Over the last several months she reports frequent episodes of palpitations that occur several times per week. Episodes often lasts several minutes. She notes associated chest discomfort (often pressure-like), dyspnea, diaphoresis, dizziness, lightheadedness and near syncope. She denies any recent frank syncope, although she notes  that she did have a syncopal episode years ago. She denies any chest pain in the absence of palpitations. Recently, she had an episode that prompted her to seek emergency evaluation. She was seen in the [facility] emergency department 11/18/2013. Per records, she was noted to be tachycardic with a heart rate in the 140s. Her labs were unremarkable. She was apparently given IV Lopressor, IV fluids and Ativan and her heart rate improved into the 90s. She was discharged directly from the ED and did not require admission. Since that time, she has continued to have frequent symptoms.  She reports that she has been fully compliant with her medications including metoprolol. However, she notes that this was cut back years ago due to symptomatic hypotension. She is on short-acting Lopressor (25 mg) but only takes this once a day. She denies any excessive caffeine intake. She notes that she is a social drinker and denies regular use of alcohol and no excessive binge drinking. She denies any recreational drug use. No known history of thyroid disorders.  Mrs. Dorsi returns today for follow-up. She had been continuing to have palpitations. She underwent a nuclear stress test which was negative for ischemia. An echocardiogram showed no significant abnormalities other than a small pericardial effusion. Is not clear as to what the cause of this is. She is currently wearing a CardioNet monitor which is scheduled for January 15. She is ready had auto transmissions on December 17 and December 18 showing sinus rhythm and sinus tachycardia with a rate of 137. On December 20 she had 3 transmission showing sinus tachycardia in the 130s with symptoms of heart racing.  This was described during light activities. On January 1 she had an episode of heart racing with heart rate up to 154 doing light activity. She did feel palpitations at the time.  Mr. Angelik back today in the office for follow-up of her laboratory work and monitor. The  monitor shows probably sinus tachycardia which seems to be inappropriate. She had heart rates up in the 150s and 160s with light activity. Sometimes this is associated with anxiety but she's noticed that during exercise, even with minimal activity. She reports some improvement in her symptoms on Zoloft as apparent compared to being on Effexor. She continues to take the Lopressor. She has been able to take 25 mg in the morning and 12.5 mg in the evening but blood pressure is too low taking a 25 mg twice daily dose. Rheumatologic workup revealed a negative ANA and CRP and sedimentation rate were negative.  Meher returns today for follow-up of her echo. She underwent a repeat limited echo which shows normal LV function and resolution of her pericardial effusion. She says she's had no more palpitations of significance. She is currently off of all beta blockers as she is working on trying to become pregnant again. She understands that there is a high risk of her having recurrent palpitations during a subsequent pregnancy.  06/27/2015  Kenzlei was seen back today in the office for follow-up. In February she had recurrent tachycardia and palpitations. This seemed to be associated with some anger/frustration. She's been under a lot of stress as expected. She has been trying to reduce caffeine intake. She since has had no further episodes. She is still not interested in medications as she is working on trying to get pregnant.  07/06/2019  Rayleigh is seen today for palpitations.  She is considered a new patient as her last office visit was just over 3 years ago.  She reports she is recently had a significant increase in palpitations.  She has been under a lot of stress worrying about the coronavirus.  She was anxious about getting the vaccine and thought she might be having some side effects.  Subsequently she had issues with tachycardia.  She is generally been able to improve her tachycardia with lorazepam.  She  also is on Zoloft.  For short time she was on Lexapro however had to stop that due to her pregnancy.  We discussed options for monitoring however in the past she said that she has been monitored multiple times and had no acute findings.  We have also done an echocardiogram and other tests without any significant abnormalities.  She denies any chest pain symptoms.  Current Outpatient Medications  Medication Sig Dispense Refill  . ibuprofen (ADVIL,MOTRIN) 600 MG tablet Take 1 tablet (600 mg total) by mouth every 6 (six) hours. 30 tablet 0  . LORazepam (ATIVAN) 1 MG tablet Take 1 mg by mouth as directed.    . sertraline (ZOLOFT) 100 MG tablet Take 1 tablet (100 mg total) by mouth daily. 30 tablet 6   No current facility-administered medications for this visit.    Allergies  Allergen Reactions  . Amoxicillin Hives    Has patient had a PCN reaction causing immediate rash, facial/tongue/throat swelling, SOB or lightheadedness with hypotension: {Yes/No/Unknown:[id number] Has patient had a PCN reaction causing severe rash involving mucus membranes or skin necrosis: Unknown Has patient had a PCN reaction that required hospitalization: Unknown Has patient had a PCN reaction occurring within the last 10 years: Unknown If all of the above  answers are "NO", then may proceed with Cephalosporin use.  . Bactrim Nausea And Vomiting  . Erythromycin Other (See Comments)    Childhood reaction  . Sulfa Drugs Cross Reactors Nausea And Vomiting    Social History   Socioeconomic History  . Marital status: Married    Spouse name: Not on file  . Number of children: 1  . Years of education: Not on file  . Highest education level: Not on file  Occupational History    Employer: UNEMPLOYED  Tobacco Use  . Smoking status: Former Smoker    Packs/day: 0.25    Types: Cigarettes  . Smokeless tobacco: Never Used  Substance and Sexual Activity  . Alcohol use: No  . Drug use: No  . Sexual activity: Yes    Birth  control/protection: None  Other Topics Concern  . Not on file  Social History Narrative  . Not on file   Social Determinants of Health   Financial Resource Strain:   . Difficulty of Paying Living Expenses:   Food Insecurity:   . Worried About Programme researcher, broadcasting/film/video in the Last Year:   . Barista in the Last Year:   Transportation Needs:   . Freight forwarder (Medical):   Marland Kitchen Lack of Transportation (Non-Medical):   Physical Activity:   . Days of Exercise per Week:   . Minutes of Exercise per Session:   Stress:   . Feeling of Stress :   Social Connections:   . Frequency of Communication with Friends and Family:   . Frequency of Social Gatherings with Friends and Family:   . Attends Religious Services:   . Active Member of Clubs or Organizations:   . Attends Banker Meetings:   Marland Kitchen Marital Status:   Intimate Partner Violence:   . Fear of Current or Ex-Partner:   . Emotionally Abused:   Marland Kitchen Physically Abused:   . Sexually Abused:      Review of Systems: Pertinent items noted in HPI and remainder of comprehensive ROS otherwise negative.  Blood pressure (!) 144/94, pulse (!) 109, height 5\' 7"  (1.702 m), weight 181 lb 6.4 oz (82.3 kg), unknown if currently breastfeeding.  deferred  EKG Sinus tachycardia 109-personally reviewed  ASSESSMENT AND PLAN:   1. Palpitations  Josseline is seen again today for more palpitations.  I do feel that this is anxiety related.  I have encouraged her to reach out to her PCP for options or perhaps to adjust her therapies.  She is also seeing a therapist for a number of years.  We could try beta-blockers however she said she was taken off of them because she felt like her heart rate was going too slow.  This would only be helpful if she had more sustained tachycardia but her episodes are brief and they seem to be improved after taking lorazepam.  I am happy to see her back if further testing is necessary.  Marcelino Duster, MD,  [facility], FACP  [place]  [facility]  Medical Director of the Advanced Lipid Disorders &  Cardiovascular Risk Reduction Clinic Diplomate of the American Board of Clinical Lipidology Attending Cardiologist  Direct Dial: [phone]  Fax: [phone]  Website:  [email or web address] Jeanluc Wegman 07/06/2019 3:50 PM

## 2019-07-23 ENCOUNTER — Ambulatory Visit
Admission: RE | Admit: 2019-07-23 | Discharge: 2019-07-23 | Disposition: A | Discharge status: Home / Self Care | Payer: PRIVATE HEALTH INSURANCE | Source: Ambulatory Visit | Attending: Nurse Practitioner | Admitting: Nurse Practitioner

## 2019-07-23 ENCOUNTER — Other Ambulatory Visit: Payer: Self-pay | Admitting: Nurse Practitioner

## 2019-07-23 DIAGNOSIS — R0602 Shortness of breath: Secondary | ICD-10-CM

## 2019-07-24 ENCOUNTER — Telehealth: Payer: Self-pay | Admitting: Internal Medicine

## 2019-07-24 NOTE — Telephone Encounter (Signed)
Recommendations are still the same - we could monitor her (she did not want to) or consider medical therapy - would give Lexapro 2-4 weeks to see if it is helping. Could try a CCB since she did not tolerate BB well.  DR H

## 2019-07-24 NOTE — Telephone Encounter (Signed)
Patient aware of MD recommendations 

## 2019-07-24 NOTE — Telephone Encounter (Addendum)
Patient of Dr. Rennis Golden contacted nurse directly with concerns of shortness of breath, rapid heartbeat that will sometime wake her up, ongoing for about 1 month. The symptoms do not occur daily. Shortness of breath can occur with exertion, but again is not constant/persistent. She reports history of similar symptoms in the past, which subsided, but have resumed since having the COVID19 vaccination. She does have a history of inappropriate ST and remote pericardial effusion and reports fam hx of heart disease in father. She was recently evaluated for this but symptoms have not resolved.Her PCP changed her anxiety medication to Lexapro last week.  Advised patient would route to MD to advise if there is any other tests that can be ordered. She has worn a monitor in the past that did not show any rhythm issues

## 2019-07-28 NOTE — Telephone Encounter (Signed)
Patient communicated to RN that she is still have symptoms. They had not been present for 2 days but she woke up this morning with same issues stating that "when I wake up like this, I go into panic mode". She is requesting to be checked out, have testing done, as her symptoms have been ongoing for 2 months. She does not want to try a medication right now, as her symptoms are not daily. She is to the point with her symptoms that she is contemplating going to the ER. She is on week 3 of her new anxiety medication, Lexapro. Offered her 11:45am appt with Wynema Birch PA today but she cannot make this as she has no one to keep her children.   Will send to MD.

## 2019-07-30 NOTE — Telephone Encounter (Signed)
Patient notified of MD recommendations.

## 2019-07-30 NOTE — Telephone Encounter (Signed)
Would recommend she try treatment with diltiazem - can start with short-acting 30 mg tabs to take as needed.  Dr Rexene Edison

## 2019-09-07 DIAGNOSIS — I471 Supraventricular tachycardia: Secondary | ICD-10-CM

## 2020-03-30 ENCOUNTER — Telehealth: Payer: Self-pay | Admitting: Internal Medicine

## 2020-03-30 NOTE — Telephone Encounter (Signed)
New Message:     Pt is requesting to switch from Dr Rennis Golden to Dr Elease Hashimoto. Is this alright with you both?

## 2020-03-30 NOTE — Telephone Encounter (Signed)
That's fine with me.  Dr Rexene Edison

## 2020-03-31 NOTE — Telephone Encounter (Signed)
I am now part time and am currently reassigning some of my own patients to our new partners.   I am not currently accepting transfers from within the group

## 2020-04-04 NOTE — Telephone Encounter (Signed)
Patient wated to know if she could be reassigned to Dr. Mayford Knife since Dr. Elease Hashimoto is not taking new patients. Please let the patient know what the office decides

## 2020-04-04 NOTE — Telephone Encounter (Signed)
I am fine with that 

## 2020-04-27 ENCOUNTER — Ambulatory Visit: Payer: PRIVATE HEALTH INSURANCE | Admitting: Cardiology

## 2020-05-27 ENCOUNTER — Other Ambulatory Visit: Payer: Self-pay

## 2020-05-27 ENCOUNTER — Encounter: Payer: Self-pay | Admitting: Cardiology

## 2020-05-27 ENCOUNTER — Ambulatory Visit (INDEPENDENT_AMBULATORY_CARE_PROVIDER_SITE_OTHER): Payer: Self-pay | Admitting: Cardiology

## 2020-05-27 VITALS — BP 118/80 | HR 80 | Ht 67.0 in | Wt 187.4 lb

## 2020-05-27 DIAGNOSIS — R000 Tachycardia, unspecified: Secondary | ICD-10-CM

## 2020-05-27 NOTE — Progress Notes (Addendum)
Cardiology Office Note:    Date:  05/27/2020   ID:  [name], DOB 1976/02/19, MRN [id number]  PCP:  [name] Ravel, MD  Cardiologist:  [name] Magic, MD    Referring MD: [name] Ravel, MD   Chief Complaint  Patient presents with  . Follow-up    Palpitations    History of Present Illness:    [name] is a 45 y.o. female with a hx of palpitations, SVT/tachycardia and has been on beta blocker therapy with metoprolol. Prior cardiac assessments have included an echocardiogram in 2009 which was a normal study with normal LV function. She has never had a stress test. She reports a strong family history of CAD/sudden cardiac death. Her paternal grandfather died suddenly at the age of 71 ( was told that this was cardiac related/likely MI). Her father also has coronary disease and has suffered 2 myocardial infarctions, the first in his 110s. She denies any personal history of tobacco abuse. She has no diagnoses of diabetes.  She has a history of anxiety. This was made more pronounced 8 years ago after she and her husband suffered the loss of a child. She is followed by a psychiatrist, has undergone counseling and has also been on medications for anxiety/depression.  She was seen a year ago by Dr. Rennis [name].  She has a remote hx of ucelar stress test showing no ischemia, 2D echo was normal other than a small PE (resolved on followup echo) and heart monitor showing sinus tachycardia up to 137bpm and was dx with inappropriate sinus tachycardia with HRs as high as 150-160's sometimes associated with anxiety.  Zoloft was helpful with this as well as BB.  Dr. Rennis [name] felt at the time of her last OV that her HR elevations were more likely related to anxiety.  She had taken herself off BB because she thought that her HR was too slow.  PRN followup was recommended.  Of note she also had a CPXT in 2016 that was normal.    She had her second COVID 19 vaccine a year ago and started having palpitations again  with HR up into the 140's and 2D echo was normal and Cxray was normal.  Since then she saw her PCP with increased palptiations and elevated BP and was placed on Propranolol 20mg  BID by her PCP.    She is now here for followup.  She has done well with the propranolol and has not had any further palpitations.  She denies any chest pain or pressure, DOE ( except when she gets very anxious and has panic attacks), LE edema, dizziness or syncope.    Past Medical History:  Diagnosis Date  . Abnormal Pap smear   . Anxiety   . Breast mass    left  . Depression   . GERD (gastroesophageal reflux disease)   . Headache(784.0)    migraines  . History of miscarriage   . Incomplete RBBB   . Palpitations   . Pregnancy induced hypertension   . Syncope and collapse   . Tachycardia   . Vaginal Pap smear, abnormal     Past Surgical History:  Procedure Laterality Date  . BREAST ENHANCEMENT SURGERY  2012  . CHOLECYSTECTOMY    . DILATION AND CURETTAGE OF UTERUS  2008  . LIPOMA EXCISION  2010   left leg  . 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 2009 ECHOCARDIOGRAPHY  06/17/2007   EF 55-60%  Current Medications: Current Meds  Medication Sig  . Cholecalciferol (VITAMIN D) 125 MCG (5000 UT) CAPS Take by mouth.  . escitalopram (LEXAPRO) 20 MG tablet Take 1 tablet by mouth daily.  . hydrochlorothiazide (HYDRODIURIL) 12.5 MG tablet Take 12.5 mg by mouth daily.  Marland [name] ibuprofen (ADVIL) 800 MG tablet Take 800 mg by mouth every 8 (eight) hours as needed.  Marland [name] LORazepam (ATIVAN) 1 MG tablet Take 1 mg by mouth as directed.  . propranolol (INDERAL) 20 MG tablet Take 20 mg by mouth 2 (two) times daily.     Allergies:   Amoxicillin, Bactrim, Erythromycin, and Sulfa drugs cross reactors   Social History   Socioeconomic History  . Marital status: Married    Spouse name: Not on file  . Number of children: 1  . Years of education: Not on file  . Highest education  level: Not on file  Occupational History    Employer: UNEMPLOYED  Tobacco Use  . Smoking status: Former Smoker    Packs/day: 0.25    Types: Cigarettes  . Smokeless tobacco: Never Used  Substance and Sexual Activity  . Alcohol use: No  . Drug use: No  . Sexual activity: Yes    Birth control/protection: None  Other Topics Concern  . Not on file  Social History Narrative  . Not on file   Social Determinants of Health   Financial Resource Strain: Not on file  Food Insecurity: Not on file  Transportation Needs: Not on file  Physical Activity: Not on file  Stress: Not on file  Social Connections: Not on file     Family History: The patient's family history includes COPD in her maternal grandmother and mother; Crohn's disease in her brother; Diabetes in her maternal grandmother; Heart attack in her father; Heart disease in her father and mother; Hyperlipidemia in her mother; Hypertension in her father and mother.  ROS:   Please see the history of present illness.    ROS  All other systems reviewed and negative.   EKGs/Labs/Other Studies Reviewed:    The following studies were reviewed today: Event monitor  EKG:  EKG is  ordered today.  The ekg ordered today demonstrates NSR with no ST changes  Recent Labs: No results found for requested labs within last 8760 hours.   Recent Lipid Panel    Component Value Date/Time   CHOL 134 02/05/2014 0846   TRIG 131 02/05/2014 0846   HDL 47 02/05/2014 0846   CHOLHDL 2.9 02/05/2014 0846   VLDL 26 02/05/2014 0846   LDLCALC 61 02/05/2014 0846    CHA2DS2-VASc Score =   [ ] .  Therefore, the patient's annual risk of stroke is   %.        Physical Exam:    VS:  BP 118/80   Pulse 80   Ht 5\' 7"  (1.702 m)   Wt 187 lb 6.4 oz (85 kg)   SpO2 97%   BMI 29.35 kg/m     Wt Readings from Last 3 Encounters:  05/27/20 187 lb 6.4 oz (85 kg)  07/06/19 181 lb 6.4 oz (82.3 kg)  06/03/17 188 lb (85.3 kg)     No data found.  GEN: Well  nourished, well developed in no acute distress HEENT: Normal NECK: No JVD; No carotid bruits LYMPHATICS: No lymphadenopathy CARDIAC:RRR, no murmurs, rubs, gallops RESPIRATORY:  Clear to auscultation without rales, wheezing or rhonchi  ABDOMEN: Soft, non-tender, non-distended MUSCULOSKELETAL:  No edema; No deformity  SKIN: Warm and dry NEUROLOGIC:  Alert and oriented x 3 PSYCHIATRIC:  Normal affect    ASSESSMENT:    1. Inappropriate sinus tachycardia    PLAN:    In order of problems listed above:  Palpitations -had complete cardiac workup that was normal except for sinus tachycardia that was felt to be anxiety -was on BB for a while and took herself off as she thought her HR was too slow -developed recurrent palpitations after the COVID 19 vaccine and had an echo that was normal and started on Propranolol 20mg  BID a few months ago with resolution of palpitations -she is not orthostatic on exam today -she seems very stable on current BB dose -recommend getting into a routine aerobic exercise probram -followup with me PRN  Medication Adjustments/Labs and Tests Ordered: Current medicines are reviewed at length with the patient today.  Concerns regarding medicines are outlined above.  Orders Placed This Encounter  Procedures  . EKG 12-Lead   No orders of the defined types were placed in this encounter.   Signed, , MD  05/27/2020 9:31 AM    [facility]

## 2020-05-27 NOTE — Patient Instructions (Signed)
Medication Instructions:  Your physician recommends that you continue on your current medications as directed. Please refer to the Current Medication list given to you today.  *If you need a refill on your cardiac medications before your next appointment, please call your pharmacy*   Lab Work: none If you have labs (blood work) drawn today and your tests are completely normal, you will receive your results only by: Marland Kitchen MyChart Message (if you have MyChart) OR . A paper copy in the mail If you have any lab test that is abnormal or we need to change your treatment, we will call you to review the results.   Testing/Procedures: none   Follow-Up: At [facility], you and your health needs are our priority.  As part of our continuing mission to provide you with exceptional heart care, we have created designated Provider Care Teams.  These Care Teams include your primary Cardiologist (physician) and Advanced Practice Providers (APPs -  Physician Assistants and Nurse Practitioners) who all work together to provide you with the care you need, when you need it.  We recommend signing up for the patient portal called "MyChart".  Sign up information is provided on this After Visit Summary.  MyChart is used to connect with patients for Virtual Visits (Telemedicine).  Patients are able to view lab/test results, encounter notes, upcoming appointments, etc.  Non-urgent messages can be sent to your provider as well.   To learn more about what you can do with MyChart, go to [email or web address].    Your next appointment:   As needed  The format for your next appointment:   In Person  Provider:   You may see Armanda Magic, MD or one of the following Advanced Practice Providers on your designated Care Team:    Ronie Spies, PA-C  Jacolyn Reedy, PA-C    Other Instructions

## 2021-04-12 ENCOUNTER — Telehealth: Payer: Self-pay | Admitting: Cardiology

## 2021-04-12 NOTE — Telephone Encounter (Signed)
Spoke with pt who states she was carrying her 40 pound daughter up a flight of stairs to preschool and developed some momentary lightheadedness and SOB that resolved after she put her daughter down.  Pt is taking Propranolol and HCTZ as prescribed. She has not been checking her BP regularly as of late.  Pt reports she has severe anxiety and went to the car and took an Ativan.  She feels fine now and is doing dishes at home.  She would like to schedule an appointment with Dr Mayford Knife as it has been close to a year since she was last seen. Offered reassurance that per Dr Mayford Knife previous cardiac work up was normal. Pt advised to begin checking her BP regularly 2 hours after taking medications.  Encouraged hydration.  Will forward to scheduling to assist with appointment.  Pt verbalizes understanding and agrees with current plan.

## 2021-04-12 NOTE — Telephone Encounter (Signed)
Called triage, but Mindi Junker, RN advised this call was not STAT. Advised patient her call will be returned shortly.

## 2021-04-12 NOTE — Telephone Encounter (Signed)
Pt c/o Shortness Of Breath: STAT if SOB developed within the last 24 hours or pt is noticeably SOB on the phone  1. Are you currently SOB (can you hear that pt is SOB on the phone)?  No   2. How long have you been experiencing SOB?  Patient states SOB developed this morning while dropping her daughter off at daycare. She states she went to pick up her daughter and became extremely SOB and went into a panic. States she struggles with anxiety and assumes this may have caused the episode of SOB. She took Ativan and it made her feel better, but she states she is still concerned because this doesn't happen often.  3. Are you SOB when sitting or when up moving around?  When up and moving around   4. Are you currently experiencing any other symptoms?  No

## 2021-04-18 ENCOUNTER — Other Ambulatory Visit: Payer: Self-pay

## 2021-04-18 ENCOUNTER — Ambulatory Visit: Payer: 59 | Admitting: Cardiology

## 2021-04-18 ENCOUNTER — Ambulatory Visit (INDEPENDENT_AMBULATORY_CARE_PROVIDER_SITE_OTHER): Payer: 59

## 2021-04-18 VITALS — BP 120/90 | HR 80 | Ht 66.0 in | Wt 191.5 lb

## 2021-04-18 DIAGNOSIS — R0602 Shortness of breath: Secondary | ICD-10-CM

## 2021-04-18 DIAGNOSIS — R002 Palpitations: Secondary | ICD-10-CM

## 2021-04-18 LAB — MAGNESIUM: Magnesium: 2 mg/dL (ref 1.6–2.3)

## 2021-04-18 LAB — BASIC METABOLIC PANEL
BUN/Creatinine Ratio: 20 (ref 9–23)
BUN: 16 mg/dL (ref 6–24)
CO2: 27 mmol/L (ref 20–29)
Calcium: 9.7 mg/dL (ref 8.7–10.2)
Chloride: 98 mmol/L (ref 96–106)
Creatinine, Ser: 0.82 mg/dL (ref 0.57–1.00)
Glucose: 82 mg/dL (ref 70–99)
Potassium: 4.1 mmol/L (ref 3.5–5.2)
Sodium: 138 mmol/L (ref 134–144)
eGFR: 91 mL/min/{1.73_m2} (ref >59)

## 2021-04-18 LAB — TSH: TSH: 1.72 u[IU]/mL (ref 0.450–4.500)

## 2021-04-18 NOTE — Addendum Note (Signed)
Addended by: Loren Racer on: 04/18/2021 11:52 AM   Modules accepted: Orders

## 2021-04-18 NOTE — Progress Notes (Signed)
Cardiology Office Note:    Date:  04/18/2021   ID:  [name] HunterMichelle Eslinger, DOB 18-Nov-1975, MRN [id number]  PCP:  Ailene RavelHamrick, Maura L, MD  Cardiologist:  Armanda Magicraci Atthew Coutant, MD    Referring MD: Ailene RavelHamrick, Maura L, MD   Chief Complaint  Patient presents with   Shortness of Breath   Palpitations    History of Present Illness:    [name] is a 46 y.o. female with a hx of palpitations, SVT/tachycardia and has been on beta blocker therapy with metoprolol. Prior cardiac assessments have included an echocardiogram in 2009 which was a normal study with normal LV function. She has never had a stress test. She reports a strong family history of CAD/sudden cardiac death. Her paternal grandfather died suddenly at the age of 44 ( was told that this was cardiac related/likely MI). Her father also has coronary disease and has suffered 2 myocardial infarctions, the first in his 7040s. She denies any personal history of tobacco abuse. She has no diagnoses of diabetes.  She has a history of anxiety. This was made more pronounced after she and her husband suffered the loss of a child. She is followed by a psychiatrist, has undergone counseling and has also been on medications for anxiety/depression.  She was seen  by Dr. Rennis GoldenHilty.  She has a remote hx of nuclear stress test showing no ischemia, 2D echo was normal other than a small PE (resolved on followup echo) and heart monitor showing sinus tachycardia up to 137bpm and was dx with inappropriate sinus tachycardia with HRs as high as 150-160's sometimes associated with anxiety.  Zoloft was helpful with this as well as BB.  Dr. Rennis GoldenHilty felt at the time of her last OV that her HR elevations were more likely related to anxiety.  She had taken herself off BB because she thought that her HR was too slow.  PRN followup was recommended.  Of note she also had a CPXT in 2016 that was normal.    After her second COVID 19 vaccine she started having palpitations again with HR up into the 140's  and 2D echo was normal and Cxray was normal.  Since then she saw her PCP with increased palptiations and elevated BP and was placed on Propranolol 20mg  BID by her PCP.  When I saw her back a year ago she was not orthostatic on exam and HR was well controlled.    She is now here for followup and is doing well. She denies any chest pain or pressure, LE edema, PND, orthopnea or syncope.  She tells me that she took her 44yo to preschool a week ago and was carrying her in to the school and had a sudden burst of palpitations with flip flop of her heart and then felt SOB and lightheaded and had to sit down. The flip flop lasted 2-3 minutes and resolved after she took some deep breaths and sat down.  She says that she felt tired after that occurred.  Since then she has not had any further sx.  She denies any coffee that am.  She does drink some caffeine later in the am.    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   escitalopram (LEXAPRO) 20 MG tablet Take 1 tablet by mouth daily.   hydrochlorothiazide (HYDRODIURIL) 12.5 MG tablet Take 12.5 mg by mouth daily.   ibuprofen (ADVIL) 800 MG tablet Take 800 mg by mouth every 8 (eight) hours as needed.   LORazepam  (ATIVAN) 1 MG tablet Take 1 mg by mouth as directed.   omeprazole (PRILOSEC) 20 MG capsule Take 20 mg by mouth daily.   propranolol (INDERAL) 20 MG tablet Take 20 mg by mouth 2 (two) times daily.     Allergies:   Amoxicillin, Bactrim, Erythromycin, and Sulfa drugs cross reactors   Social History   Socioeconomic History   Marital status: Married    Spouse name: Not on file   Number of children: 1   Years of education: Not on file   Highest education level: Not on file  Occupational History    Employer: UNEMPLOYED  Tobacco Use   Smoking status: Former    Packs/day: 0.25    Types: Cigarettes   Smokeless tobacco: Never  Substance and Sexual Activity   Alcohol use: No   Drug use: No   Sexual activity: Yes    Birth control/protection: None  Other Topics Concern   Not on file  Social History Narrative   Not on file   Social Determinants of Health   Financial Resource Strain: Not on file  Food Insecurity: Not on file  Transportation Needs: Not on file  Physical Activity: Not on file  Stress: Not on file  Social Connections: Not on file     Family History: The patient's family history includes COPD in her maternal grandmother and mother; Crohn's disease in her brother; Diabetes in her maternal grandmother; Heart attack in her father; Heart disease in her father and mother; Hyperlipidemia in her mother; Hypertension in her father and mother.  ROS:   Please see the history of present illness.    ROS  All other systems reviewed and negative.   EKGs/Labs/Other Studies Reviewed:    The following studies were reviewed today: Event monitor  EKG:  EKG is  ordered today.  The ekg ordered today demonstrates NSR with no ST change  Recent Labs: No results found for requested labs within last 8760 hours.   Recent Lipid Panel    Component Value Date/Time   CHOL 134 02/05/2014 0846   TRIG 131 02/05/2014 0846   HDL 47 02/05/2014 0846   CHOLHDL 2.9 02/05/2014 0846   VLDL 26  02/05/2014 0846   LDLCALC 61 02/05/2014 0846    Physical Exam:    VS:  BP 120/90    Pulse 80    Ht 5\' 6"  (1.676 m)    Wt 191 lb 8 oz (86.9 kg)    SpO2 97%    BMI 30.91 kg/m     Wt Readings from Last 3 Encounters:  04/18/21  191 lb 8 oz (86.9 kg)  05/27/20 187 lb 6.4 oz (85 kg)  07/06/19 181 lb 6.4 oz (82.3 kg)     No data found.  GEN: Well nourished, well developed in no acute distress HEENT: Normal NECK: No JVD; No carotid bruits LYMPHATICS: No lymphadenopathy CARDIAC:RRR, no murmurs, rubs, gallops RESPIRATORY:  Clear to auscultation without rales, wheezing or rhonchi  ABDOMEN: Soft, non-tender, non-distended MUSCULOSKELETAL:  No edema; No deformity  SKIN: Warm and dry NEUROLOGIC:  Alert and oriented x 3 PSYCHIATRIC:  Normal affect   ASSESSMENT:    1. Palpitations   2. SOB (shortness of breath)    PLAN:    In order of problems listed above:  Palpitations -had complete cardiac workup that was normal except for sinus tachycardia that was felt to be anxiety -was on BB for a while and took herself off as she thought her HR was too slow -developed recurrent palpitations after the COVID 19 vaccine and had an echo that was normal and started on Propranolol 20mg  BID a few months ago with resolution of palpitations -she had an episode of palpitations lasting 2-3 minutes associated with SOB and dizziness that sounds like an arrhythmia -I will get a 2 week ziopatch  -recommend getting into a routine aerobic exercise probram  SOB -2D echo in 2016 and nuclear stress test were normal -I will get a coronary Ca score to assess future cardiac risk -repeat 2D echo to make sure that LVF is intact>>if normal then refer to pulmonary   Medication Adjustments/Labs and Tests Ordered: Current medicines are reviewed at length with the patient today.  Concerns regarding medicines are outlined above.  Orders Placed This Encounter  Procedures   EKG 12-Lead   No orders of the defined types  were placed in this encounter.   Signed, Fransico Him, MD  04/18/2021 11:43 AM    [place]

## 2021-04-18 NOTE — Addendum Note (Signed)
Addended by: Loren Racer on: 04/18/2021 11:56 AM   Modules accepted: Orders

## 2021-04-18 NOTE — Patient Instructions (Addendum)
Medication Instructions:  Your physician recommends that you continue on your current medications as directed. Please refer to the Current Medication list given to you today.  *If you need a refill on your cardiac medications before your next appointment, please call your pharmacy*   Lab Work: TSH, BMET and Magnesium today  If you have labs (blood work) drawn today and your tests are completely normal, you will receive your results only by: MyChart Message (if you have MyChart) OR A paper copy in the mail If you have any lab test that is abnormal or we need to change your treatment, we will call you to review the results.   Testing/Procedures: Your physician has requested that you have an echocardiogram. Echocardiography is a painless test that uses sound waves to create images of your heart. It provides your doctor with information about the size and shape of your heart and how well your hearts chambers and valves are working. This procedure takes approximately one hour. There are no restrictions for this procedure.  Your physician recommends that you have a Calcium Score performed.  Your physician recommends that you wear a monitor for 2 weeks.  See next page for more information.   Follow-Up: At [facility], you and your health needs are our priority.  As part of our continuing mission to provide you with exceptional heart care, we have created designated Provider Care Teams.  These Care Teams include your primary Cardiologist (physician) and Advanced Practice Providers (APPs -  Physician Assistants and Nurse Practitioners) who all work together to provide you with the care you need, when you need it.  We recommend signing up for the patient portal called "MyChart".  Sign up information is provided on this After Visit Summary.  MyChart is used to connect with patients for Virtual Visits (Telemedicine).  Patients are able to view lab/test results, encounter notes, upcoming  appointments, etc.  Non-urgent messages can be sent to your provider as well.   To learn more about what you can do with MyChart, go to [email or web address].    Your next appointment:   1 year(s)  The format for your next appointment:   In Person  Provider:   Armanda Magic, MD     Other Instructions  ZIO XT- Long Term Monitor Instructions  Your physician has requested you wear a ZIO patch monitor for 14 days.  This is a single patch monitor. [employer] supplies one patch monitor per enrollment. Additional stickers are not available. Please do not apply patch if you will be having a Nuclear Stress Test,  Echocardiogram, Cardiac CT, MRI, or Chest Xray during the period you would be wearing the  monitor. The patch cannot be worn during these tests. You cannot remove and re-apply the  ZIO XT patch monitor.  Your ZIO patch monitor will be mailed 3 day USPS to your address on file. It may take 3-5 days  to receive your monitor after you have been enrolled.  Once you have received your monitor, please review the enclosed instructions. Your monitor  has already been registered assigning a specific monitor serial # to you.  Billing and Patient Assistance Program Information  We have supplied [employer] with any of your insurance information on file for billing purposes. [employer] offers a sliding scale Patient Assistance Program for patients that do not have  insurance, or whose insurance does not completely cover the cost of the ZIO monitor.  You must apply for the Patient Assistance Program to qualify for this  discounted rate.  To apply, please call [employer] at [phone], select option 4, select option 2, ask to apply for  Patient Assistance Program. Meredeth Ide will ask your household income, and how many people  are in your household. They will quote your out-of-pocket cost based on that information.  [employer] will also be able to set up a 68-month, interest-free payment plan if  needed.  Applying the monitor   Shave hair from upper left chest.  Hold abrader disc by orange tab. Rub abrader in 40 strokes over the upper left chest as  indicated in your monitor instructions.  Clean area with 4 enclosed alcohol pads. Let dry.  Apply patch as indicated in monitor instructions. Patch will be placed under collarbone on left  side of chest with arrow pointing upward.  Rub patch adhesive wings for 2 minutes. Remove white label marked "1". Remove the white  label marked "2". Rub patch adhesive wings for 2 additional minutes.  While looking in a mirror, press and release button in center of patch. A small green light will  flash 3-4 times. This will be your only indicator that the monitor has been turned on.  Do not shower for the first 24 hours. You may shower after the first 24 hours.  Press the button if you feel a symptom. You will hear a small click. Record Date, Time and  Symptom in the Patient Logbook.  When you are ready to remove the patch, follow instructions on the last 2 pages of Patient  Logbook. Stick patch monitor onto the last page of Patient Logbook.  Place Patient Logbook in the blue and white box. Use locking tab on box and tape box closed  securely. The blue and white box has prepaid postage on it. Please place it in the mailbox as  soon as possible. Your physician should have your test results approximately 7 days after the  monitor has been mailed back to [facility].  Call [facility] Customer Care at [phone] if you have questions regarding  your ZIO XT patch monitor. Call them immediately if you see an orange light blinking on your  monitor.  If your monitor falls off in less than 4 days, contact our Monitor department at [phone].  If your monitor becomes loose or falls off after 4 days call [employer] at [phone] for  suggestions on securing your monitor

## 2021-04-18 NOTE — Progress Notes (Unsigned)
Enrolled for [employer] to mail a ZIO XT long term holter monitor to the patients address on file.  

## 2021-04-21 DIAGNOSIS — R002 Palpitations: Secondary | ICD-10-CM

## 2021-05-10 DIAGNOSIS — R002 Palpitations: Secondary | ICD-10-CM | POA: Diagnosis not present

## 2021-05-11 ENCOUNTER — Ambulatory Visit (INDEPENDENT_AMBULATORY_CARE_PROVIDER_SITE_OTHER)
Admission: RE | Admit: 2021-05-11 | Discharge: 2021-05-11 | Disposition: A | Discharge status: Home / Self Care | Payer: Self-pay | Source: Ambulatory Visit | Attending: Cardiology | Admitting: Cardiology

## 2021-05-11 ENCOUNTER — Other Ambulatory Visit: Payer: Self-pay

## 2021-05-11 ENCOUNTER — Ambulatory Visit (HOSPITAL_COMMUNITY): Payer: 59 | Attending: Cardiovascular Disease

## 2021-05-11 DIAGNOSIS — R0602 Shortness of breath: Secondary | ICD-10-CM

## 2021-05-11 LAB — ECHOCARDIOGRAM COMPLETE
Area-P 1/2: 4.68 cm2
S' Lateral: 2.7 cm

## 2021-05-12 ENCOUNTER — Telehealth: Payer: Self-pay

## 2021-05-12 DIAGNOSIS — K760 Fatty (change of) liver, not elsewhere classified: Secondary | ICD-10-CM

## 2021-05-12 DIAGNOSIS — R0602 Shortness of breath: Secondary | ICD-10-CM

## 2021-05-12 NOTE — Telephone Encounter (Signed)
-----   Message from Sueanne Margarita, MD sent at 05/12/2021  9:00 AM EDT ----- ?Please refer to pulmonary for shortness of breath ?

## 2021-05-12 NOTE — Telephone Encounter (Signed)
[name] Margarita, MD  ?05/11/2021 10:35 AM EDT   ?  ?Noncardiac portion of coronary calcium scoring shows severe fatty liver.  Please refer to Dr. Deno Etienne with [facility] ASAP or one of her colleagues  ? ? ? ?The patient has been notified of the result and verbalized understanding.  All questions (if any) were answered. ?Antonieta Iba, RN 05/12/2021 11:07 AM  ?Referrals have been placed ?

## 2021-05-17 ENCOUNTER — Other Ambulatory Visit (INDEPENDENT_AMBULATORY_CARE_PROVIDER_SITE_OTHER): Payer: 59

## 2021-05-17 ENCOUNTER — Encounter: Payer: Self-pay | Admitting: Gastroenterology

## 2021-05-17 ENCOUNTER — Ambulatory Visit: Payer: 59 | Admitting: Gastroenterology

## 2021-05-17 VITALS — BP 122/80 | HR 86 | Ht 66.0 in | Wt 191.0 lb

## 2021-05-17 DIAGNOSIS — K760 Fatty (change of) liver, not elsewhere classified: Secondary | ICD-10-CM | POA: Diagnosis not present

## 2021-05-17 LAB — CBC
HCT: 43.7 % (ref 36.0–46.0)
Hemoglobin: 15.1 g/dL — ABNORMAL HIGH (ref 12.0–15.0)
MCHC: 34.6 g/dL (ref 30.0–36.0)
MCV: 91 fl (ref 78.0–100.0)
Platelets: 389 10*3/uL (ref 150.0–400.0)
RBC: 4.8 Mil/uL (ref 3.87–5.11)
RDW: 13 % (ref 11.5–15.5)
WBC: 8.9 10*3/uL (ref 4.0–10.5)

## 2021-05-17 LAB — COMPREHENSIVE METABOLIC PANEL
ALT: 24 U/L (ref 0–35)
AST: 19 U/L (ref 0–37)
Albumin: 4.9 g/dL (ref 3.5–5.2)
Alkaline Phosphatase: 55 U/L (ref 39–117)
BUN: 18 mg/dL (ref 6–23)
CO2: 27 mEq/L (ref 19–32)
Calcium: 9.7 mg/dL (ref 8.4–10.5)
Chloride: 100 mEq/L (ref 96–112)
Creatinine, Ser: 0.87 mg/dL (ref 0.40–1.20)
GFR: 81.59 mL/min (ref >60.00)
Glucose, Bld: 91 mg/dL (ref 70–99)
Potassium: 3.8 mEq/L (ref 3.5–5.1)
Sodium: 135 mEq/L (ref 135–145)
Total Bilirubin: 0.7 mg/dL (ref 0.2–1.2)
Total Protein: 7.7 g/dL (ref 6.0–8.3)

## 2021-05-17 LAB — PROTIME-INR
INR: 1 ratio (ref 0.8–1.0)
Prothrombin Time: 10.6 s (ref 9.6–13.1)

## 2021-05-17 NOTE — Progress Notes (Signed)
HPI: ?This is a very pleasant 46 year old woman who was referred by Dr. Mayford Knife from cardiology for fatty liver noted on recent cardiac CT scan. ? ?Chief complaint is fatty liver on CT scan ? ?04/2021 CT cardiac scoring: Calcium score 0.  The radiologist commented on "severe hepatic steatosis." ? ?Most recent liver tests which I can see in her epic chart are from 4 years ago and they were completely normal except for an albumin of 2.8. ? ?She has never been told that she had any abnormalities with her liver previously.  She had the cardiac CT because she was having intermittent rapid heart rates. ? ?Liver disease does not run in her family ? ?She has never had hepatitis or jaundice.  She rarely drinks alcohol.  Her weight is overall stable.  She does not have any abdominal pains. ? ? ?Review of systems: ?Pertinent positive and negative review of systems were noted in the above HPI section. All other review negative. ? ? ?Past Medical History:  ?Diagnosis Date  ? Abnormal Pap smear   ? Anxiety   ? Breast mass   ? left  ? Depression   ? GERD (gastroesophageal reflux disease)   ? Headache(784.0)   ? migraines  ? History of miscarriage   ? Incomplete RBBB   ? Palpitations   ? Pregnancy induced hypertension   ? Syncope and collapse   ? Tachycardia   ? Vaginal Pap smear, abnormal   ? ? ?Past Surgical History:  ?Procedure Laterality Date  ? BREAST ENHANCEMENT SURGERY  2012  ? CHOLECYSTECTOMY    ? DILATION AND CURETTAGE OF UTERUS  2008  ? LIPOMA EXCISION  2010  ? left leg  ? THERAPEUTIC ABORTION  2008  ? 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 US ECHOCARDIOGRAPHY  06/17/2007  ? EF 55-60%  ? ? ?Current Outpatient Medications  ?Medication Instructions  ? Cholecalciferol (VITAMIN D) 125 MCG (5000 UT) CAPS Oral  ? escitalopram (LEXAPRO) 20 MG tablet 1 tablet, Oral, Daily  ? hydrochlorothiazide (HYDRODIURIL) 12.5 mg, Oral, Daily  ? ibuprofen (ADVIL) 800 mg, Oral, Every 8 hours PRN  ? LORazepam (ATIVAN) 1 mg, Oral, As  directed  ? omeprazole (PRILOSEC) 20 mg, Oral, Daily  ? propranolol (INDERAL) 20 mg, Oral, 2 times daily  ? ? ?Allergies as of 05/17/2021 - Review Complete 05/17/2021  ?Allergen Reaction Noted  ? Amoxicillin Hives 03/28/2015  ? Bactrim Nausea And Vomiting 10/25/2010  ? Erythromycin Other (See Comments) 10/25/2010  ? Sulfa drugs cross reactors Nausea And Vomiting 11/03/2010  ? ? ?Family History  ?Problem Relation Age of Onset  ? Hypertension Mother   ? Hyperlipidemia Mother   ? Heart disease Mother   ? COPD Mother   ? Heart attack Father   ?     X2  ? Heart disease Father   ? Hypertension Father   ? Colon cancer Father   ? Crohn's disease Brother   ? COPD Maternal Grandmother   ? Diabetes Maternal Grandmother   ? Stomach cancer Neg Hx   ? Esophageal cancer Neg Hx   ? ? ?Social History  ? ?Socioeconomic History  ? Marital status: Married  ?  Spouse name: Not on file  ? Number of children: 1  ? Years of education: Not on file  ? Highest education level: Not on file  ?Occupational History  ?  Employer: UNEMPLOYED  ?Tobacco Use  ? Smoking status: Former  ?  Packs/day: 0.25  ?  Types:  Cigarettes  ? Smokeless tobacco: Never  ?Vaping Use  ? Vaping Use: Never used  ?Substance and Sexual Activity  ? Alcohol use: No  ? Drug use: No  ? Sexual activity: Yes  ?  Birth control/protection: None  ?Other Topics Concern  ? Not on file  ?Social History Narrative  ? Not on file  ? ?Social Determinants of Health  ? ?Financial Resource Strain: Not on file  ?Food Insecurity: Not on file  ?Transportation Needs: Not on file  ?Physical Activity: Not on file  ?Stress: Not on file  ?Social Connections: Not on file  ?Intimate Partner Violence: Not on file  ? ? ? ?Physical Exam: ?BP 122/80   Pulse 86   Ht 5\' 6"  (1.676 m)   Wt 191 lb (86.6 kg)   LMP 04/17/2021 (Exact Date)   BMI 30.83 kg/m?  ?Constitutional: generally well-appearing ?Psychiatric: alert and oriented x3 ?Eyes: extraocular movements intact ?Mouth: oral pharynx moist, no  lesions ?Neck: supple no lymphadenopathy ?Cardiovascular: heart regular rate and rhythm ?Lungs: clear to auscultation bilaterally ?Abdomen: soft, nontender, nondistended, no obvious ascites, no peritoneal signs, normal bowel sounds ?Extremities: no lower extremity edema bilaterally ?Skin: no lesions on visible extremities ? ? ?Assessment and plan: ?46 y.o. female with incidental fatty liver noted by cardiac CT ? ?She has no personal or family history of liver disease.  She does not drink alcohol.  She is somewhat obese but has not had any significant increases or decreases in her weight recently.  I explained to her that although her liver appears to have extra fat in it by that imaging study it is not clear to me at all that she has fatty liver disease or more specifically that she has irritation of her liver from the fat which is in her liver.  To help answer that most important question first I am going to order CBC, complete metabolic panel and coags.  She will also have a formal evaluation of her liver with right upper quadrant ultrasound.  Pending those results she understands she might need more liver testing and she certainly understands that if she indeed has fatty liver disease that the most important thing she could do would be to lose weight. ? ?Please see the "Patient Instructions" section for addition details about the plan. ? ? ?57, MD ?[facility] ?05/17/2021, 10:34 AM ? ?Cc: Hamrick, 05/19/2021, MD ? ?Total time on date of encounter was 46  minutes (this included time spent preparing to see the patient reviewing records; obtaining and/or reviewing separately obtained history; performing a medically appropriate exam and/or evaluation; counseling and educating the patient and family if present; ordering medications, tests or procedures if applicable; and documenting clinical information in the health record). ? ? ?

## 2021-05-17 NOTE — Patient Instructions (Signed)
If you are age 44 or younger, your body mass index should be between 19-25. Your Body mass index is 30.83 kg/m?Marland Kitchen If this is out of the aformentioned range listed, please consider follow up with your Primary Care Provider.  ?________________________________________________________ ? ?The [facility] providers would like to encourage you to use [facility] to communicate with providers for non-urgent requests or questions.  Due to long hold times on the telephone, sending your provider a message by [facility] may be a faster and more efficient way to get a response.  Please allow 48 business hours for a response.  Please remember that this is for non-urgent requests.  ?_______________________________________________________ ? ?Your provider has requested that you go to the basement level for lab work before leaving today. Press "B" on the elevator. The lab is located at the first door on the left as you exit the elevator. ? ?Due to recent changes in healthcare laws, you may see the results of your imaging and laboratory studies on MyChart before your provider has had a chance to review them.  We understand that in some cases there may be results that are confusing or concerning to you. Not all laboratory results come back in the same time frame and the provider may be waiting for multiple results in order to interpret others.  Please give [place] 48 hours in order for your provider to thoroughly review all the results before contacting the office for clarification of your results.  ? ?You have been scheduled for an abdominal ultrasound at [facility] (1st floor of hospital) on 05-24-21 at 8:00am. Please arrive 30 minutes prior to your appointment for registration. Make certain not to have anything to eat or drink after midnight the night prior to your appointment. Should you need to reschedule your appointment, please contact radiology at [phone]. This test typically takes about 30 minutes to perform. ? ?Thank you for  entrusting me with your care and choosing [facility]. ? ?Dr Christella Hartigan ? ?

## 2021-05-24 ENCOUNTER — Ambulatory Visit (HOSPITAL_COMMUNITY)
Admission: RE | Admit: 2021-05-24 | Discharge: 2021-05-24 | Disposition: A | Discharge status: Home / Self Care | Payer: 59 | Source: Ambulatory Visit | Attending: Gastroenterology | Admitting: Gastroenterology

## 2021-05-24 DIAGNOSIS — Z9049 Acquired absence of other specified parts of digestive tract: Secondary | ICD-10-CM | POA: Diagnosis not present

## 2021-05-24 DIAGNOSIS — K760 Fatty (change of) liver, not elsewhere classified: Secondary | ICD-10-CM | POA: Insufficient documentation

## 2021-05-31 ENCOUNTER — Telehealth: Payer: Self-pay | Admitting: Gastroenterology

## 2021-05-31 NOTE — Telephone Encounter (Signed)
Your blood work was all normal.  Your liver does not appear irritated at all and so I see no reason for further blood work.  Please continue with the plans for the ultrasound imaging of your liver and I will contact you when that is back.  Thank you  ?Written by Milus Banister, MD on 05/19/2021  7:33 AM EDT ? ?The pt has been advised of lab and [place] results.  No questions at this time.  ?

## 2021-05-31 NOTE — Telephone Encounter (Signed)
Inbound call from patient requesting lab results. Please advise. 

## 2021-06-06 ENCOUNTER — Institutional Professional Consult (permissible substitution): Payer: 59 | Admitting: Pulmonary Disease

## 2021-07-11 DIAGNOSIS — Z1211 Encounter for screening for malignant neoplasm of colon: Secondary | ICD-10-CM | POA: Diagnosis not present

## 2021-07-11 DIAGNOSIS — Z3040 Encounter for surveillance of contraceptives, unspecified: Secondary | ICD-10-CM | POA: Diagnosis not present

## 2021-07-11 DIAGNOSIS — Z6832 Body mass index (BMI) 32.0-32.9, adult: Secondary | ICD-10-CM | POA: Diagnosis not present

## 2021-07-11 DIAGNOSIS — Z01419 Encounter for gynecological examination (general) (routine) without abnormal findings: Secondary | ICD-10-CM | POA: Diagnosis not present

## 2021-07-11 DIAGNOSIS — Z1231 Encounter for screening mammogram for malignant neoplasm of breast: Secondary | ICD-10-CM | POA: Diagnosis not present

## 2021-07-25 DIAGNOSIS — K219 Gastro-esophageal reflux disease without esophagitis: Secondary | ICD-10-CM | POA: Diagnosis not present

## 2021-07-25 DIAGNOSIS — R69 Illness, unspecified: Secondary | ICD-10-CM | POA: Diagnosis not present

## 2021-07-25 DIAGNOSIS — R002 Palpitations: Secondary | ICD-10-CM | POA: Diagnosis not present

## 2021-07-25 DIAGNOSIS — I10 Essential (primary) hypertension: Secondary | ICD-10-CM | POA: Diagnosis not present

## 2021-07-25 DIAGNOSIS — G43909 Migraine, unspecified, not intractable, without status migrainosus: Secondary | ICD-10-CM | POA: Diagnosis not present

## 2021-07-25 DIAGNOSIS — Z79899 Other long term (current) drug therapy: Secondary | ICD-10-CM | POA: Diagnosis not present

## 2021-09-10 DIAGNOSIS — J019 Acute sinusitis, unspecified: Secondary | ICD-10-CM | POA: Diagnosis not present

## 2021-09-10 DIAGNOSIS — J069 Acute upper respiratory infection, unspecified: Secondary | ICD-10-CM | POA: Diagnosis not present

## 2021-11-08 DIAGNOSIS — L821 Other seborrheic keratosis: Secondary | ICD-10-CM | POA: Diagnosis not present

## 2021-11-20 ENCOUNTER — Telehealth: Payer: Self-pay

## 2021-11-20 DIAGNOSIS — K760 Fatty (change of) liver, not elsewhere classified: Secondary | ICD-10-CM

## 2021-11-20 NOTE — Telephone Encounter (Signed)
-----   Message from Stevan Born, [place] sent at 05/31/2021 10:08 AM EDT ----- Regarding: labs Patient needs LFTs per Dr Ardis Hughs dx fatty liver October, nhr

## 2021-11-20 NOTE — Telephone Encounter (Signed)
Dr Henrene Pastor,   This pt needs 6 month LFT's per Ardis Hughs dx fatty liver.   You are DOD this afternoon.  I will be ordering these labs under your name.   Thank you, Elmyra Ricks

## 2021-11-20 NOTE — Telephone Encounter (Signed)
Okay 

## 2022-02-09 DIAGNOSIS — Z13220 Encounter for screening for lipoid disorders: Secondary | ICD-10-CM | POA: Diagnosis not present

## 2022-02-09 DIAGNOSIS — Z79899 Other long term (current) drug therapy: Secondary | ICD-10-CM | POA: Diagnosis not present

## 2022-02-09 DIAGNOSIS — G43909 Migraine, unspecified, not intractable, without status migrainosus: Secondary | ICD-10-CM | POA: Diagnosis not present

## 2022-02-09 DIAGNOSIS — R69 Illness, unspecified: Secondary | ICD-10-CM | POA: Diagnosis not present

## 2022-02-09 DIAGNOSIS — I10 Essential (primary) hypertension: Secondary | ICD-10-CM | POA: Diagnosis not present

## 2022-02-09 DIAGNOSIS — K219 Gastro-esophageal reflux disease without esophagitis: Secondary | ICD-10-CM | POA: Diagnosis not present

## 2022-02-09 DIAGNOSIS — R002 Palpitations: Secondary | ICD-10-CM | POA: Diagnosis not present

## 2022-02-09 DIAGNOSIS — J019 Acute sinusitis, unspecified: Secondary | ICD-10-CM | POA: Diagnosis not present

## 2022-02-09 DIAGNOSIS — Z1331 Encounter for screening for depression: Secondary | ICD-10-CM | POA: Diagnosis not present

## 2022-03-07 DIAGNOSIS — D2272 Melanocytic nevi of left lower limb, including hip: Secondary | ICD-10-CM | POA: Diagnosis not present

## 2022-03-07 DIAGNOSIS — D1801 Hemangioma of skin and subcutaneous tissue: Secondary | ICD-10-CM | POA: Diagnosis not present

## 2022-03-07 DIAGNOSIS — L820 Inflamed seborrheic keratosis: Secondary | ICD-10-CM | POA: Diagnosis not present

## 2022-03-07 DIAGNOSIS — D225 Melanocytic nevi of trunk: Secondary | ICD-10-CM | POA: Diagnosis not present

## 2022-03-07 DIAGNOSIS — L821 Other seborrheic keratosis: Secondary | ICD-10-CM | POA: Diagnosis not present

## 2022-03-07 DIAGNOSIS — L814 Other melanin hyperpigmentation: Secondary | ICD-10-CM | POA: Diagnosis not present

## 2022-03-07 DIAGNOSIS — B078 Other viral warts: Secondary | ICD-10-CM | POA: Diagnosis not present

## 2022-05-10 DIAGNOSIS — R0982 Postnasal drip: Secondary | ICD-10-CM | POA: Diagnosis not present

## 2022-05-10 DIAGNOSIS — J324 Chronic pansinusitis: Secondary | ICD-10-CM | POA: Diagnosis not present

## 2022-05-10 DIAGNOSIS — R0981 Nasal congestion: Secondary | ICD-10-CM | POA: Diagnosis not present

## 2022-07-19 DIAGNOSIS — Z6832 Body mass index (BMI) 32.0-32.9, adult: Secondary | ICD-10-CM | POA: Diagnosis not present

## 2022-07-19 DIAGNOSIS — Z3040 Encounter for surveillance of contraceptives, unspecified: Secondary | ICD-10-CM | POA: Diagnosis not present

## 2022-07-19 DIAGNOSIS — Z1211 Encounter for screening for malignant neoplasm of colon: Secondary | ICD-10-CM | POA: Diagnosis not present

## 2022-07-19 DIAGNOSIS — Z01419 Encounter for gynecological examination (general) (routine) without abnormal findings: Secondary | ICD-10-CM | POA: Diagnosis not present

## 2022-07-19 DIAGNOSIS — Z139 Encounter for screening, unspecified: Secondary | ICD-10-CM | POA: Diagnosis not present

## 2022-07-19 DIAGNOSIS — Z1231 Encounter for screening mammogram for malignant neoplasm of breast: Secondary | ICD-10-CM | POA: Diagnosis not present

## 2022-07-25 ENCOUNTER — Other Ambulatory Visit: Payer: Self-pay | Admitting: Obstetrics and Gynecology

## 2022-07-25 DIAGNOSIS — R928 Other abnormal and inconclusive findings on diagnostic imaging of breast: Secondary | ICD-10-CM

## 2022-07-27 ENCOUNTER — Ambulatory Visit
Admission: RE | Admit: 2022-07-27 | Discharge: 2022-07-27 | Disposition: A | Discharge status: Home / Self Care | Payer: 59 | Source: Ambulatory Visit | Attending: Obstetrics and Gynecology | Admitting: Obstetrics and Gynecology

## 2022-07-27 ENCOUNTER — Other Ambulatory Visit: Payer: Self-pay | Admitting: Obstetrics and Gynecology

## 2022-07-27 DIAGNOSIS — N63 Unspecified lump in unspecified breast: Secondary | ICD-10-CM | POA: Diagnosis not present

## 2022-07-27 DIAGNOSIS — R928 Other abnormal and inconclusive findings on diagnostic imaging of breast: Secondary | ICD-10-CM

## 2022-07-27 DIAGNOSIS — R922 Inconclusive mammogram: Secondary | ICD-10-CM | POA: Diagnosis not present

## 2022-07-31 ENCOUNTER — Ambulatory Visit
Admission: RE | Admit: 2022-07-31 | Discharge: 2022-07-31 | Disposition: A | Discharge status: Home / Self Care | Payer: 59 | Source: Ambulatory Visit | Attending: Obstetrics and Gynecology | Admitting: Obstetrics and Gynecology

## 2022-07-31 DIAGNOSIS — N6313 Unspecified lump in the right breast, lower outer quadrant: Secondary | ICD-10-CM | POA: Diagnosis not present

## 2022-07-31 DIAGNOSIS — R928 Other abnormal and inconclusive findings on diagnostic imaging of breast: Secondary | ICD-10-CM

## 2022-07-31 HISTORY — PX: BREAST BIOPSY: SHX20

## 2022-08-02 ENCOUNTER — Telehealth: Payer: Self-pay | Admitting: Hematology and Oncology

## 2022-08-02 NOTE — Telephone Encounter (Signed)
Spoke to patient to confirm upcoming morning [facility] clinic appointment on 6/19, paperwork will be sent via mail.   Gave location and time, also informed patient that the surgeon's office would be calling as well to get information from them similar to the packet that they will be receiving so make sure to do both.  Reminded patient that all providers will be coming to the clinic to see them HERE and if they had any questions to not hesitate to reach back out to myself or their navigators.

## 2022-08-06 ENCOUNTER — Encounter: Payer: Self-pay | Admitting: *Deleted

## 2022-08-06 DIAGNOSIS — C50511 Malignant neoplasm of lower-outer quadrant of right female breast: Secondary | ICD-10-CM | POA: Insufficient documentation

## 2022-08-08 ENCOUNTER — Other Ambulatory Visit: Payer: Self-pay

## 2022-08-08 ENCOUNTER — Ambulatory Visit: Payer: Self-pay | Admitting: General Surgery

## 2022-08-08 ENCOUNTER — Inpatient Hospital Stay: Payer: 59 | Attending: Hematology and Oncology

## 2022-08-08 ENCOUNTER — Encounter: Payer: Self-pay | Admitting: Physical Therapy

## 2022-08-08 ENCOUNTER — Ambulatory Visit
Admission: RE | Admit: 2022-08-08 | Discharge: 2022-08-08 | Disposition: A | Discharge status: Home / Self Care | Payer: 59 | Source: Ambulatory Visit | Attending: Radiation Oncology | Admitting: Radiation Oncology

## 2022-08-08 ENCOUNTER — Encounter: Payer: Self-pay | Admitting: Hematology and Oncology

## 2022-08-08 ENCOUNTER — Encounter: Payer: Self-pay | Admitting: Genetic Counselor

## 2022-08-08 ENCOUNTER — Inpatient Hospital Stay (HOSPITAL_BASED_OUTPATIENT_CLINIC_OR_DEPARTMENT_OTHER): Payer: 59 | Admitting: Genetic Counselor

## 2022-08-08 ENCOUNTER — Ambulatory Visit: Payer: 59 | Attending: General Surgery | Admitting: Physical Therapy

## 2022-08-08 ENCOUNTER — Inpatient Hospital Stay (HOSPITAL_BASED_OUTPATIENT_CLINIC_OR_DEPARTMENT_OTHER): Payer: 59 | Admitting: Hematology and Oncology

## 2022-08-08 ENCOUNTER — Encounter: Payer: Self-pay | Admitting: Emergency Medicine

## 2022-08-08 ENCOUNTER — Inpatient Hospital Stay: Payer: 59 | Admitting: Licensed Clinical Social Worker

## 2022-08-08 VITALS — BP 143/108 | HR 87 | Temp 98.1°F | Resp 16 | Ht 66.0 in | Wt 156.9 lb

## 2022-08-08 DIAGNOSIS — R293 Abnormal posture: Secondary | ICD-10-CM | POA: Diagnosis not present

## 2022-08-08 DIAGNOSIS — C50511 Malignant neoplasm of lower-outer quadrant of right female breast: Secondary | ICD-10-CM

## 2022-08-08 DIAGNOSIS — F419 Anxiety disorder, unspecified: Secondary | ICD-10-CM | POA: Diagnosis not present

## 2022-08-08 DIAGNOSIS — K219 Gastro-esophageal reflux disease without esophagitis: Secondary | ICD-10-CM | POA: Diagnosis not present

## 2022-08-08 DIAGNOSIS — Z800 Family history of malignant neoplasm of digestive organs: Secondary | ICD-10-CM | POA: Diagnosis not present

## 2022-08-08 DIAGNOSIS — Z79899 Other long term (current) drug therapy: Secondary | ICD-10-CM | POA: Diagnosis not present

## 2022-08-08 DIAGNOSIS — F32A Depression, unspecified: Secondary | ICD-10-CM | POA: Diagnosis not present

## 2022-08-08 DIAGNOSIS — Z170 Estrogen receptor positive status [ER+]: Secondary | ICD-10-CM

## 2022-08-08 DIAGNOSIS — Z853 Personal history of malignant neoplasm of breast: Secondary | ICD-10-CM | POA: Diagnosis not present

## 2022-08-08 DIAGNOSIS — Z87891 Personal history of nicotine dependence: Secondary | ICD-10-CM | POA: Diagnosis not present

## 2022-08-08 LAB — CMP (CANCER CENTER ONLY)
ALT: 19 U/L (ref 0–44)
AST: 19 U/L (ref 15–41)
Albumin: 4.6 g/dL (ref 3.5–5.0)
Alkaline Phosphatase: 56 U/L (ref 38–126)
Anion gap: 7 (ref 5–15)
BUN: 14 mg/dL (ref 6–20)
CO2: 31 mmol/L (ref 22–32)
Calcium: 9.9 mg/dL (ref 8.9–10.3)
Chloride: 99 mmol/L (ref 98–111)
Creatinine: 0.86 mg/dL (ref 0.44–1.00)
GFR, Estimated: >60 mL/min (ref >60)
Glucose, Bld: 100 mg/dL — ABNORMAL HIGH (ref 70–99)
Potassium: 3.8 mmol/L (ref 3.5–5.1)
Sodium: 137 mmol/L (ref 135–145)
Total Bilirubin: 1.1 mg/dL (ref 0.3–1.2)
Total Protein: 7.4 g/dL (ref 6.5–8.1)

## 2022-08-08 LAB — CBC WITH DIFFERENTIAL (CANCER CENTER ONLY)
Abs Immature Granulocytes: 0.02 10*3/uL (ref 0.00–0.07)
Basophils Absolute: 0 10*3/uL (ref 0.0–0.1)
Basophils Relative: 1 %
Eosinophils Absolute: 0.1 10*3/uL (ref 0.0–0.5)
Eosinophils Relative: 1 %
HCT: 44.3 % (ref 36.0–46.0)
Hemoglobin: 15.9 g/dL — ABNORMAL HIGH (ref 12.0–15.0)
Immature Granulocytes: 0 %
Lymphocytes Relative: 26 %
Lymphs Abs: 2.1 10*3/uL (ref 0.7–4.0)
MCH: 32.4 pg (ref 26.0–34.0)
MCHC: 35.9 g/dL (ref 30.0–36.0)
MCV: 90.4 fL (ref 80.0–100.0)
Monocytes Absolute: 0.5 10*3/uL (ref 0.1–1.0)
Monocytes Relative: 6 %
Neutro Abs: 5.4 10*3/uL (ref 1.7–7.7)
Neutrophils Relative %: 66 %
Platelet Count: 391 10*3/uL (ref 150–400)
RBC: 4.9 MIL/uL (ref 3.87–5.11)
RDW: 11.9 % (ref 11.5–15.5)
WBC Count: 8.1 10*3/uL (ref 4.0–10.5)
nRBC: 0 % (ref 0.0–0.2)

## 2022-08-08 LAB — GENETIC SCREENING ORDER

## 2022-08-08 MED ORDER — KETOROLAC TROMETHAMINE 15 MG/ML IJ SOLN: 15.0000 mg | Freq: Once | INTRAMUSCULAR | Status: AC

## 2022-08-08 NOTE — Assessment & Plan Note (Addendum)
This is a pleasant 47 year old premenopausal female patient with newly diagnosed right breast grade 2 IDC, ER/PR positive HER2 negative, low proliferation index, grade 2 DCIS referred to breast M[facility] for additional recommendations.  Given the tumor size under 5 mm and strong ER/PR positivity, she will proceed with upfront surgery.  If the final pathology shows tumor measuring over 5 mm, we can certainly consider Oncotype DX testing and I discussed the following details about Oncotype DX testing today.  We have discussed about Oncotype Dx score which is a well validated prognostic scoring system which can predict outcome with endocrine therapy alone and whether chemotherapy reduces recurrence.  Typically in patients with ER positive cancers that are node negative if the RS score is high typically greater than or equal to 26, chemotherapy is recommended.  In women with intermediate recurrence score younger than 50, there can still be some role for chemotherapy in addition to endocrine therapy especially if the recurrence score is between 21-25. If chemotherapy is needed, this will precede radiation and then after radiation she will continue on antiestrogen therapy.  If she does not qualify for Oncotype DX testing then she will proceed with adjuvant radiation followed by antiestrogen therapy.  Given her premenopausal status we focused our discussion mostly on tamoxifen today.  Have discussed mechanism of action of tamoxifen, adverse effects of tamoxifen including but not limited to postmenopausal symptoms such as hot flashes, vaginal discharge, mood swings, increased risk of DVT/PE and rarely endometrial hyperplasia and endometrial carcinoma.  The benefit of tamoxifen would be improvement in bone density.  She will start antiestrogen therapy after adjuvant radiation.  She will return to clinic after radiation to initiate antiestrogen therapy.  She is agreeable to all these recommendations.  Will plan to see her  after surgery to review any additional recommendations.  Thank you for consulting [place] in the care of this patient.  Please do not hesitate to contact us with any additional questions or concerns.

## 2022-08-08 NOTE — Research (Signed)
[employer] 2021-05 - Specimen Collection Study to Evaluate Biomarkers in Subjects with Cancer   INTRO TO STUDY/CONSENT FORMS  Patient [name] was identified by Clinical Research as a potential candidate for the above listed study.  This Clinical Research Nurse met with [name], [id number], on 08/08/22 in a manner and location that ensures patient privacy to discuss participation in the above listed research study.  Patient is Accompanied by her spouse .  A copy of the informed consent document with embedded HIPAA language was provided to the patient.  Patient reads, speaks, and understands [place].   Patient was provided with the business card of this Nurse and encouraged to contact the research team with any questions.  Approximately 15 minutes were spent with the patient reviewing the informed consent documents.  Patient was provided the option of taking informed consent documents home to review and was encouraged to review at their convenience with their support network, including other care providers. Patient took the consent documents home to review.  Will f/u with patient next week on interest.  Lurena Joiner 'Warden FillersDairl Ponder, RN, BSN, [facility] Clinical Research Nurse I 08/08/22 11:52 AM

## 2022-08-08 NOTE — Progress Notes (Signed)
[facility] Clinical Social Work  Initial Assessment   [name] is a 47 y.o. year old female accompanied by husband, [name]. Clinical Social Work was referred by  [facility]  for assessment of psychosocial needs.   SDOH (Social Determinants of Health) assessments performed: Yes SDOH Interventions    Flowsheet Row Clinical Support from 08/08/2022 in [facility]  SDOH Interventions   Food Insecurity Interventions Intervention Not Indicated  Housing Interventions Intervention Not Indicated  Transportation Interventions Intervention Not Indicated  Utilities Interventions Intervention Not Indicated       SDOH Screenings   Food Insecurity: No Food Insecurity (08/08/2022)  Housing: Low Risk  (08/08/2022)  Transportation Needs: No Transportation Needs (08/08/2022)  Utilities: Not At Risk (08/08/2022)  Depression (PHQ2-9): Low Risk  (08/08/2022)  Tobacco Use: Medium Risk (08/08/2022)     Family/Social Information:  Housing Arrangement: patient lives with husband, 66 yo son, 52 yo daughter Family members/support persons in your life? Family (husband, mom, mother in Social worker) Transportation concerns: no  Employment: Works on [employer] farm.  Financial concerns: No Type of concern: None Food access concerns: no Religious or spiritual practice: Not known Services Currently in place:  [employer], Ativan for anxiety  Coping/ Adjustment to diagnosis: Patient understands treatment plan and what happens next? yes, feeling better after meeting with medical team and learning more about diagnosis and plan Patient reported stressors: Anxiety/ nervousness and Adjusting to my illness Patient enjoys time with family/ friends Current coping skills/ strengths: Capable of independent living , Manufacturing systems engineer , Motivation for treatment/growth , and Supportive family/friends     SUMMARY: Current SDOH Barriers:  No major barriers noted today  Clinical Social Work Clinical Goal(s):   No clinical social work goals at this time  Interventions: Discussed common feeling and emotions when being diagnosed with cancer, and the importance of support during treatment Informed patient of the support team roles and support services at [facility] Provided CSW contact information and encouraged patient to call with any questions or concerns Referred patient to [employer] guide/ peer mentor   Follow Up Plan: Patient will contact CSW with any support or resource needs Patient verbalizes understanding of plan: Yes    [name] E Twana Wileman, LCSW Clinical Social Worker [facility]

## 2022-08-08 NOTE — Therapy (Signed)
OUTPATIENT PHYSICAL THERAPY BREAST CANCER BASELINE EVALUATION   Patient Name: [name] MRN: [id number] DOB:1975-10-14, 47 y.o., female Today's Date: 08/08/2022  END OF SESSION:  PT End of Session - 08/08/22 1150     Visit Number 1    Number of Visits 2    Date for PT Re-Evaluation 10/03/22    PT Start Time 1033    PT Stop Time 1104    PT Time Calculation (min) 31 min    Activity Tolerance Patient tolerated treatment well    Behavior During Therapy WFL for tasks assessed/performed             Past Medical History:  Diagnosis Date   Abnormal Pap smear    Anxiety    Breast cancer (HCC)    Breast mass    left   Depression    GERD (gastroesophageal reflux disease)    Headache(784.0)    migraines   History of miscarriage    Incomplete RBBB    Palpitations    Pregnancy induced hypertension    Syncope and collapse    Tachycardia    Vaginal Pap smear, abnormal    Past Surgical History:  Procedure Laterality Date   BREAST BIOPSY Right 07/31/2022   [place] RT BREAST BX W LOC DEV 1ST LESION IMG BX SPEC US GUIDE 07/31/2022 [facility]MOGRAPHY   BREAST ENHANCEMENT SURGERY  2012   CHOLECYSTECTOMY     DILATION AND CURETTAGE OF UTERUS  2008   LIPOMA EXCISION  2010   left leg   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   US ECHOCARDIOGRAPHY  06/17/2007   EF 55-60%   Patient Active Problem List   Diagnosis Date Noted   Malignant neoplasm of lower-outer quadrant of right breast of female, estrogen receptor positive (HCC) 08/06/2022   Preterm premature rupture of membranes 06/03/2017   Advanced maternal age in multigravida 12/20/2016   Abnormal chromosomal and genetic finding on antenatal screening of mother 12/20/2016   [redacted] weeks gestation of pregnancy    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 Chevis Pretty  REFERRING DIAG: Right breast cancer  THERAPY DIAG:  Malignant neoplasm of lower-outer quadrant of right breast of female, estrogen receptor positive (HCC)  Abnormal posture  Rationale for Evaluation and Treatment: Rehabilitation  ONSET DATE: 07/19/2022  SUBJECTIVE:  SUBJECTIVE STATEMENT: Patient reports she is here today to be seen by her medical team for her newly diagnosed right breast cancer.   PERTINENT HISTORY:  Patient was diagnosed on 07/19/2022 with right grade 2 invasive ductal carcinoma breast cancer. It measures 4 mm and is located in the lower outer quadrant. It is ER/PR positive and HER2 negative with a Ki67 of 3%. She had breast implants placed in 2012.  PATIENT GOALS:   reduce lymphedema risk and learn post op HEP.   PAIN:  Are you having pain? No  PRECAUTIONS: Active CA   HAND DOMINANCE: right  WEIGHT BEARING RESTRICTIONS: No  FALLS:  Has patient fallen in last 6 months? 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  LEISURE: She goes to the gym 3-4x/week, does 30 min of cardio and uses weight equipment  PRIOR LEVEL OF FUNCTION: Independent   OBJECTIVE:  COGNITION: Overall cognitive status: Within functional limits for tasks assessed    POSTURE:  Forward head and rounded shoulders posture  UPPER EXTREMITY AROM/PROM:  A/PROM RIGHT   eval   Shoulder extension 53  Shoulder flexion 151  Shoulder abduction 164  Shoulder internal rotation 60  Shoulder external rotation 80    (Blank rows = not tested)  A/PROM LEFT   eval  Shoulder extension 50  Shoulder flexion 150  Shoulder  abduction 174  Shoulder internal rotation 62  Shoulder external rotation 85    (Blank rows = not tested)  CERVICAL AROM: All within normal limits  UPPER EXTREMITY STRENGTH: WNL  LYMPHEDEMA ASSESSMENTS:   LANDMARK RIGHT   eval  10 cm proximal to olecranon process 28.1  Olecranon process 24.2  10 cm proximal to ulnar styloid process 22.4  Just proximal to ulnar styloid process 15.1  Across hand at thumb web space 16.8  At base of 2nd digit 5.8  (Blank rows = not tested)  LANDMARK LEFT   eval  10 cm proximal to olecranon process 27.8  Olecranon process 23.1  10 cm proximal to ulnar styloid process 21  Just proximal to ulnar styloid process 14.7  Across hand at thumb web space 16.7  At base of 2nd digit 5.9  (Blank rows = not tested)  L-DEX LYMPHEDEMA SCREENING:  The patient was assessed using the L-Dex machine today to produce a lymphedema index baseline score. The patient will be reassessed on a regular basis (typically every 3 months) to obtain new L-Dex scores. If the score is > 6.5 points away from his/her baseline score indicating onset of subclinical lymphedema, it will be recommended to wear a compression garment for 4 weeks, 12 hours per day and then be reassessed. If the score continues to be > 6.5 points from baseline at reassessment, we will initiate lymphedema treatment. Assessing in this manner has a 95% rate of preventing clinically significant lymphedema.   L-DEX FLOWSHEETS - 08/08/22 1100       L-DEX LYMPHEDEMA SCREENING   Measurement Type Unilateral    L-DEX MEASUREMENT EXTREMITY Upper Extremity    POSITION  Standing    DOMINANT SIDE Right    At Risk Side Right    BASELINE SCORE (UNILATERAL) -1.6             QUICK DASH SURVEY:  Neldon Mc - 08/08/22 0001     Open a  tight or new jar No difficulty    Do heavy household chores (wash walls, wash floors) No difficulty    Carry a shopping bag or briefcase No difficulty    Wash your back No difficulty     Use a knife to cut food No difficulty    Recreational activities in which you take some force or impact through your arm, shoulder, or hand (golf, hammering, tennis) No difficulty    During the past week, to what extent has your arm, shoulder or hand problem interfered with your normal social activities with family, friends, neighbors, or groups? Not at all    During the past week, to what extent has your arm, shoulder or hand problem limited your work or other regular daily activities Not at all    Arm, shoulder, or hand pain. None    Tingling (pins and needles) in your arm, shoulder, or hand None    Difficulty Sleeping No difficulty    DASH Score 0 %              PATIENT EDUCATION:  Education details: Lymphedema risk reduction and post op shoulder/posture HEP Person educated: Patient Education method: Explanation, Demonstration, Handout Education comprehension: Patient verbalized understanding and returned demonstration  HOME EXERCISE PROGRAM: Patient was instructed today in a home exercise program today for post op shoulder range of motion. These included active assist shoulder flexion in sitting, scapular retraction, wall walking with shoulder abduction, and hands behind head external rotation.  She was encouraged to do these twice a day, holding 3 seconds and repeating 5 times when permitted by her physician.   ASSESSMENT:  CLINICAL IMPRESSION: Patient was diagnosed on 07/19/2022 with right grade 2 invasive ductal carcinoma breast cancer. It measures 4 mm and is located in the lower outer quadrant. It is ER/PR positive and HER2 negative with a Ki67 of 3%. She had breast implants placed in 2012.Her multidisciplinary medical team met prior to her assessments to determine a recommended treatment plan. She is planning to have a right lumpectomy and sentinel node biopsy, radiation, and anti-estrogen. She will benefit from a post op PT reassessment to determine needs and from L-Dex  screens every 3 months for 2 years to detect subclinical lymphedema.  Pt will benefit from skilled therapeutic intervention to improve on the following deficits: Decreased knowledge of precautions, impaired UE functional use, pain, decreased ROM, postural dysfunction.   PT treatment/interventions: ADL/self-care home management, pt/family education, therapeutic exercise  REHAB POTENTIAL: Excellent  CLINICAL DECISION MAKING: Stable/uncomplicated  EVALUATION COMPLEXITY: Low   GOALS: Goals reviewed with patient? YES  LONG TERM GOALS: (STG=LTG)    Name Target Date Goal status  1 Pt will be able to verbalize understanding of pertinent lymphedema risk reduction practices relevant to her dx specifically related to skin care.  Baseline:  No knowledge 08/08/2022 Achieved at eval  2 Pt will be able to return demo and/or verbalize understanding of the post op HEP related to regaining shoulder ROM. Baseline:  No knowledge 08/08/2022 Achieved at eval  3 Pt will be able to verbalize understanding of the importance of attending the post op After Breast CA Class for further lymphedema risk reduction education and therapeutic exercise.  Baseline:  No knowledge 08/08/2022 Achieved at eval  4 Pt will demo she has regained full shoulder ROM and function post operatively compared to baselines.  Baseline: See objective measurements taken today. 10/03/2022     PLAN:  PT FREQUENCY/DURATION: EVAL and 1 follow up appointment.  PLAN FOR NEXT SESSION: will reassess 3-4 weeks post op to determine needs.   Patient will follow up at outpatient cancer rehab 3-4 weeks following surgery.  If the patient requires physical therapy at that time, a specific plan will be dictated and sent to the referring physician for approval. The patient was educated today on appropriate basic range of motion exercises to begin post operatively and the importance of attending the After Breast Cancer class following surgery.  Patient was  educated today on lymphedema risk reduction practices as it pertains to recommendations that will benefit the patient immediately following surgery.  She verbalized good understanding.    Physical Therapy Information for After Breast Cancer Surgery/Treatment:  Lymphedema is a swelling condition that you may be at risk for in your arm if you have lymph nodes removed from the armpit area.  After a sentinel node biopsy, the risk is approximately 5-9% and is higher after an axillary node dissection.  There is treatment available for this condition and it is not life-threatening.  Contact your physician or physical therapist with concerns. You may begin the 4 shoulder/posture exercises (see additional sheet) when permitted by your physician (typically a week after surgery).  If you have drains, you may need to wait until those are removed before beginning range of motion exercises.  A general recommendation is to not lift your arms above shoulder height until drains are removed.  These exercises should be done to your tolerance and gently.  This is not a "no pain/no gain" type of recovery so listen to your body and stretch into the range of motion that you can tolerate, stopping if you have pain.  If you are having immediate reconstruction, ask your plastic surgeon about doing exercises as he or she may want you to wait. We encourage you to attend the free one time ABC (After Breast Cancer) class offered by [facility].  You will learn information related to lymphedema risk, prevention and treatment and additional exercises to regain mobility following surgery.  You can call [phone] for more information.  This is offered the 1st and 3rd Monday of each month.  You only attend the class one time. While undergoing any medical procedure or treatment, try to avoid blood pressure being taken or needle sticks from occurring on the arm on the side of cancer.   This recommendation begins after  surgery and continues for the rest of your life.  This may help reduce your risk of getting lymphedema (swelling in your arm). An excellent resource for those seeking information on lymphedema is the National Lymphedema Network's web site. It can be accessed at www.lymphnet.org If you notice swelling in your hand, arm or breast at any time following surgery (even if it is many years from now), please contact your doctor or physical therapist to discuss this.  Lymphedema can be treated at any time but it is easier for you if it is treated early on.  If you feel like your shoulder motion is not returning to normal in a reasonable amount of time, please contact your surgeon or physical therapist.  [facility] [phone]. [address], [address], [place] [address]  ABC CLASS After Breast Cancer Class  After Breast Cancer Class is a specially designed exercise class to assist you in a safe recover after having breast cancer surgery.  In this class you will learn how to get back to full function whether your drains were  just removed or if you had surgery a month ago.  This one-time class is held the 1st and 3rd Monday of every month from 11:00 a.m. until 12:00 noon virtually.  This class is FREE and space is limited. For more information or to register for the next available class, call [phone].  Class Goals  Understand specific stretches to improve the flexibility of you chest and shoulder. Learn ways to safely strengthen your upper body and improve your posture. Understand the warning signs of infection and why you may be at risk for an arm infection. Learn about Lymphedema and prevention.  ** You do not attend this class until after surgery.  Drains must be removed to participate  Patient was instructed today in a home exercise program today for post op shoulder range of motion. These included active assist shoulder flexion in sitting, scapular  retraction, wall walking with shoulder abduction, and hands behind head external rotation.  She was encouraged to do these twice a day, holding 3 seconds and repeating 5 times when permitted by her physician.  Bethann Punches, [place] 08/08/22 12:06 PM

## 2022-08-08 NOTE — Progress Notes (Signed)
Radiation Oncology         [phone] ________________________________  Multidisciplinary Breast Oncology Clinic [facility]) Initial Outpatient Consultation  Name: [name] MRN: [id number]  Date: 08/08/2022  DOB: 06/11/1975  WG:[id number], Durward Fortes, MD  Griselda Miner, MD   REFERRING PHYSICIAN: [name], MD  DIAGNOSIS: The encounter diagnosis was Malignant neoplasm of lower-outer quadrant of right breast of female, estrogen receptor positive (HCC).  Stage IA (cT1a, cN0, cM0) Right Breast LOQ, Invasive ductal carcinoma with high-grade DCIS, ER+ / PR+ / Her2-, Grade 2    ICD-10-CM   1. Malignant neoplasm of lower-outer quadrant of right breast of female, estrogen receptor positive (HCC)  C50.511    Z17.0       HISTORY OF PRESENT ILLNESS::[name] is a 47 y.o. female who is presenting to the office today for evaluation of her newly diagnosed breast cancer. She is accompanied by her husband. She is doing well overall.   She had routine screening mammography on 07/19/22 showing a possible abnormality in the right breast. She underwent right breast diagnostic mammography with tomography and right breast ultrasonography at [facility] on 07/27/22 showing: a suspicious 4 mm mass in the lower right breast, located 5 cmfn. No abnormal appearing right axillary lymph nodes were appreciated.  Biopsy of the 6 o'clock right breast on 07/31/22 showed: grade 2 invasive ductal carcinoma measuring 4 mm in the greatest linear extent of the sample with high grade DCIS. Prognostic indicators significant for: estrogen receptor, 90% positive and progesterone receptor, 95% positive, both with strong staining intensity. Proliferation marker Ki67 at 3%. HER2 negative.  The patient was referred today for presentation in the multidisciplinary conference.  Radiology studies and pathology slides were presented there for review and discussion of treatment options.  A consensus was discussed regarding  potential next steps.  PREVIOUS RADIATION THERAPY: No  PAST MEDICAL HISTORY:  Past Medical History:  Diagnosis Date   Abnormal Pap smear    Anxiety    Breast cancer (HCC)    Breast mass    left   Depression    GERD (gastroesophageal reflux disease)    Headache(784.0)    migraines   History of miscarriage    Incomplete RBBB    Palpitations    Pregnancy induced hypertension    Syncope and collapse    Tachycardia    Vaginal Pap smear, abnormal     PAST SURGICAL HISTORY: Past Surgical History:  Procedure Laterality Date   BREAST BIOPSY Right 07/31/2022   [place] RT BREAST BX W LOC DEV 1ST LESION IMG BX SPEC US GUIDE 07/31/2022 [facility]   BREAST ENHANCEMENT SURGERY  2012   CHOLECYSTECTOMY     DILATION AND CURETTAGE OF UTERUS  2008   LIPOMA EXCISION  2010   left leg   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   US ECHOCARDIOGRAPHY  06/17/2007   EF 55-60%    FAMILY HISTORY:  Family History  Problem Relation Age of Onset   Hypertension Mother    Hyperlipidemia Mother    Heart disease Mother    COPD Mother    Heart attack Father        X2   Heart disease Father    Hypertension Father    Colon cancer Father 84   Crohn's disease Brother    COPD Maternal Grandmother    Diabetes Maternal Grandmother    Stomach cancer Neg Hx    Esophageal cancer Neg  Hx     SOCIAL HISTORY:  Social History   Socioeconomic History   Marital status: Married    Spouse name: Not on file   Number of children: 1   Years of education: Not on file   Highest education level: Not on file  Occupational History    Employer: UNEMPLOYED  Tobacco Use   Smoking status: Former    Packs/day: .25    Types: Cigarettes   Smokeless tobacco: Never  Vaping Use   Vaping Use: Never used  Substance and Sexual Activity   Alcohol use: No   Drug use: No   Sexual activity: Yes    Birth control/protection: None  Other Topics Concern   Not on file  Social  History Narrative   Not on file   Social Determinants of Health   Financial Resource Strain: Not on file  Food Insecurity: No Food Insecurity (08/08/2022)   Hunger Vital Sign    Worried About Running Out of Food in the Last Year: Never true    Ran Out of Food in the Last Year: Never true  Transportation Needs: No Transportation Needs (08/08/2022)   PRAPARE - Administrator, Civil Service (Medical): No    Lack of Transportation (Non-Medical): No  Physical Activity: Not on file  Stress: Not on file  Social Connections: Not on file    ALLERGIES:  Allergies  Allergen Reactions   Amoxicillin Hives    Has patient had a PCN reaction causing immediate rash, facial/tongue/throat swelling, SOB or lightheadedness with hypotension: {Yes/No/Unknown:[id number] Has patient had a PCN reaction causing severe rash involving mucus membranes or skin necrosis: Unknown Has patient had a PCN reaction that required hospitalization: Unknown Has patient had a PCN reaction occurring within the last 10 years: Unknown If all of the above answers are "NO", then may proceed with Cephalosporin use.   Bactrim Nausea And Vomiting   Erythromycin Other (See Comments)    Childhood reaction   Sulfa Drugs Cross Reactors Nausea And Vomiting    MEDICATIONS:  Current Outpatient Medications  Medication Sig Dispense Refill   Cholecalciferol (VITAMIN D) 125 MCG (5000 UT) CAPS Take by mouth.     escitalopram (LEXAPRO) 20 MG tablet Take 1 tablet by mouth daily.     hydrochlorothiazide (HYDRODIURIL) 12.5 MG tablet Take 12.5 mg by mouth daily.     ibuprofen (ADVIL) 800 MG tablet Take 800 mg by mouth every 8 (eight) hours as needed.     LORazepam (ATIVAN) 1 MG tablet Take 1 mg by mouth as directed.     omeprazole (PRILOSEC) 20 MG capsule Take 20 mg by mouth daily.     propranolol (INDERAL) 20 MG tablet Take 20 mg by mouth 2 (two) times daily.     No current facility-administered medications for this encounter.    Facility-Administered Medications Ordered in Other Encounters  Medication Dose Route Frequency Provider Last Rate Last Admin   ketorolac (TORADOL) 15 MG/ML injection 15 mg  15 mg Intravenous Once Griselda Miner, MD        REVIEW OF SYSTEMS: A 10+ POINT REVIEW OF SYSTEMS WAS OBTAINED including neurology, dermatology, psychiatry, cardiac, respiratory, lymph, extremities, GI, GU, musculoskeletal, constitutional, reproductive, HEENT.    PHYSICAL EXAM:     08/08/2022  Vitals with BMI   Height 5\' 6"    Weight 156 lbs 14 oz   BMI 25.34   Systolic 143 !   Diastolic 108 (H)   Pulse 87     Legend: !  Abnormal (H) High  Lungs are clear to auscultation bilaterally. Heart has regular rate and rhythm. No palpable cervical, supraclavicular, or axillary adenopathy. Abdomen soft, non-tender, normal bowel sounds. Breast: Left breast with no palpable mass, nipple discharge, or bleeding with cosmetic implant in place. Right breast with extensive bruising in the lower aspect of the breast from the biopsy. No dominant mass palpable. Palpable cosmetic implant in place.     KPS = 100  100 - Normal; no complaints; no evidence of disease. 90   - Able to carry on normal activity; minor signs or symptoms of disease. 80   - Normal activity with effort; some signs or symptoms of disease. 33   - Cares for self; unable to carry on normal activity or to do active work. 60   - Requires occasional assistance, but is able to care for most of his personal needs. 50   - Requires considerable assistance and frequent medical care. 40   - Disabled; requires special care and assistance. 30   - Severely disabled; hospital admission is indicated although death not imminent. 20   - Very sick; hospital admission necessary; active supportive treatment necessary. 10   - Moribund; fatal processes progressing rapidly. 0     - Dead  Karnofsky DA, Abelmann WH, Craver LS and Burchenal [facility] [phone]) The use of the nitrogen mustards  in the palliative treatment of carcinoma: with particular reference to bronchogenic carcinoma Cancer 1 634-56  LABORATORY DATA:  Lab Results  Component Value Date   WBC 8.1 08/08/2022   HGB 15.9 (H) 08/08/2022   HCT 44.3 08/08/2022   MCV 90.4 08/08/2022   PLT 391 08/08/2022   Lab Results  Component Value Date   NA 137 08/08/2022   K 3.8 08/08/2022   CL 99 08/08/2022   CO2 31 08/08/2022   Lab Results  Component Value Date   ALT 19 08/08/2022   AST 19 08/08/2022   ALKPHOS 56 08/08/2022   BILITOT 1.1 08/08/2022    PULMONARY FUNCTION TEST:   Review Flowsheet        No data to display          RADIOGRAPHY: [place] RT BREAST BX W LOC DEV 1ST LESION IMG BX SPEC US GUIDE  Addendum Date: 08/01/2022   ADDENDUM REPORT: 08/01/2022 12:49 ADDENDUM: Pathology revealed GRADE II INVASIVE DUCTAL CARCINOMA, DUCTAL CARCINOMA IN SITU, CRIBRIFORM TYPE, HIGH NUCLEAR GRADE of the RIGHT breast, 6 o'clock, 5 cmfn, (coil clip). This was found to be concordant by Dr. Sande Brothers. Pathology results were discussed with the patient by telephone. The patient reported doing well after the biopsy with tenderness and bruising at the site. NO additional bleeding noted after discharge from [facility] on July 31, 2022. Post biopsy instructions and care were reviewed and questions were answered. The patient was encouraged to call [facility] for any additional concerns. My direct phone number was provided. The patient was referred to [facility] on August 08, 2022. Pathology results reported by Rene Kocher, RN on 08/01/2022. Electronically Signed   By: Sande Brothers M.D.   On: 08/01/2022 12:49   Result Date: 08/01/2022 CLINICAL DATA:  Suspicious right breast mass. EXAM: ULTRASOUND GUIDED RIGHT BREAST CORE NEEDLE BIOPSY COMPARISON:  Previous exam(s). PROCEDURE: I met with the patient and we discussed the  procedure of ultrasound-guided biopsy, including benefits and alternatives. We discussed the high likelihood of a successful procedure.  We discussed the risks of the procedure, including infection, bleeding, tissue injury, clip migration, and inadequate sampling. Informed written consent was given. The usual time-out protocol was performed immediately prior to the procedure. Lesion quadrant: Lower outer quadrant Using sterile technique and 1% Lidocaine as local anesthetic, under direct ultrasound visualization, a 14 gauge spring-loaded device was used to perform biopsy of a mass at the 6 o'clock position of the right breast using a lateral approach. The patient began bleeding after a single biopsy pass was taken. Firm pressure was held until the bleeding subsided. At the conclusion of the procedure a coil shaped tissue marker clip was deployed into the biopsy cavity. Follow up 2 view mammogram was performed and dictated separately. IMPRESSION: Ultrasound guided biopsy of the right breast. No apparent complications. Electronically Signed: By: Sande Brothers M.D. On: 07/31/2022 11:51  MM CLIP PLACEMENT RIGHT  Result Date: 07/31/2022 CLINICAL DATA:  Status post right breast biopsy. EXAM: 3D DIAGNOSTIC RIGHT MAMMOGRAM POST ULTRASOUND BIOPSY COMPARISON:  Previous exam(s). FINDINGS: 3D Mammographic images were obtained following ultrasound guided biopsy of the inferior right breast. The biopsy marking clip is in expected position at the site of biopsy. IMPRESSION: Appropriate positioning of the coil shaped biopsy marking clip at the site of biopsy in the inferior right breast. Final Assessment: Post Procedure Mammograms for Marker Placement Electronically Signed   By: Sande Brothers M.D.   On: 07/31/2022 12:10  MM DIAG BREAST W/IMPLANT UNI RIGHT  Result Date: 07/27/2022 CLINICAL DATA:  47 year old female presents for further evaluation of possible RIGHT breast mass on screening mammogram. EXAM: DIGITAL DIAGNOSTIC  UNILATERAL RIGHT MAMMOGRAM WITH IMPLANTS; ULTRASOUND RIGHT BREAST LIMITED TECHNIQUE: Right digital diagnostic mammography was performed. Standard and/or implant displaced views were performed. ; Targeted ultrasound examination of the right breast was performed COMPARISON:  Previous exam(s). ACR Breast Density Category b: There are scattered areas of fibroglandular density. FINDINGS: Spot compression views of the RIGHT breast demonstrate a persistent irregular mass within the posterior LOWER RIGHT breast. Targeted ultrasound is performed, showing a 0.4 x 0.4 x 0.4 cm irregular mass at the 6 o'clock position of the RIGHT breast 5 cm from the nipple. No abnormal RIGHT axillary lymph nodes are identified. IMPRESSION: 1. Suspicious 0.4 cm LOWER RIGHT breast mass. Tissue sampling is recommended. No abnormal appearing RIGHT axillary lymph nodes. RECOMMENDATION: Ultrasound-guided RIGHT breast biopsy, which will be scheduled. I have discussed the findings and recommendations with the patient. If applicable, a reminder letter will be sent to the patient regarding the next appointment. BI-RADS CATEGORY  4: Suspicious. Electronically Signed   By: Harmon Pier M.D.   On: 07/27/2022 10:13  [place] LIMITED ULTRASOUND INCLUDING AXILLA RIGHT BREAST  Result Date: 07/27/2022 CLINICAL DATA:  47 year old female presents for further evaluation of possible RIGHT breast mass on screening mammogram. EXAM: DIGITAL DIAGNOSTIC UNILATERAL RIGHT MAMMOGRAM WITH IMPLANTS; ULTRASOUND RIGHT BREAST LIMITED TECHNIQUE: Right digital diagnostic mammography was performed. Standard and/or implant displaced views were performed. ; Targeted ultrasound examination of the right breast was performed COMPARISON:  Previous exam(s). ACR Breast Density Category b: There are scattered areas of fibroglandular density. FINDINGS: Spot compression views of the RIGHT breast demonstrate a persistent irregular mass within the posterior LOWER RIGHT breast. Targeted ultrasound  is performed, showing a 0.4 x 0.4 x 0.4 cm irregular mass at the 6 o'clock position of the RIGHT breast 5 cm from the nipple. No abnormal RIGHT axillary lymph nodes are identified. IMPRESSION: 1. Suspicious 0.4 cm LOWER RIGHT breast mass. Tissue  sampling is recommended. No abnormal appearing RIGHT axillary lymph nodes. RECOMMENDATION: Ultrasound-guided RIGHT breast biopsy, which will be scheduled. I have discussed the findings and recommendations with the patient. If applicable, a reminder letter will be sent to the patient regarding the next appointment. BI-RADS CATEGORY  4: Suspicious. Electronically Signed   By: Harmon Pier M.D.   On: 07/27/2022 10:13     IMPRESSION: Stage IA (cT1a, cN0, cM0) Right Breast LOQ, Invasive ductal carcinoma with high-grade DCIS, ER+ / PR+ / Her2-, Grade 2  Patient will be a good candidate for breast conservation with radiotherapy to the right breast. We discussed the general course of radiation, potential side effects, and toxicities with radiation and the patient is interested in this approach.   PLAN:  Genetics  Right lumpectomy with SLN biopsies  (Oncotype Dx if final tumor size is greater than 0.5 cm) Adjuvant radiation therapy  Aromatase inhibitor    ------------------------------------------------  Billie Lade, PhD, MD  This document serves as a record of services personally performed by Antony Blackbird, MD. It was created on his behalf by Neena Rhymes, a trained medical scribe. The creation of this record is based on the scribe's personal observations and the provider's statements to them. This document has been checked and approved by the attending provider.

## 2022-08-08 NOTE — Progress Notes (Signed)
[facility] CONSULT NOTE  Patient Care Team: Hamrick, Durward Fortes, MD as PCP - General (Family Medicine) Quintella Reichert, MD as PCP - Cardiology (Cardiology) Antony Blackbird, MD as Consulting Physician (Radiation Oncology) Rachel Moulds, MD as Consulting Physician (Hematology and Oncology) Griselda Miner, MD as Consulting Physician (General Surgery) Donnelly Angelica, RN as Oncology Nurse Navigator Pershing Proud, RN as Oncology Nurse Navigator  CHIEF COMPLAINTS/PURPOSE OF CONSULTATION:  Newly diagnosed breast cancer  HISTORY OF PRESENTING ILLNESS:  [name] 47 y.o. female is here because of recent diagnosis of right breast mass  I reviewed her records extensively and collaborated the history with the patient.  SUMMARY OF ONCOLOGIC HISTORY: Oncology History  Malignant neoplasm of lower-outer quadrant of right breast of female, estrogen receptor positive (HCC)  07/27/2022 Mammogram   Patient had screening mammogram recall, underwent diagnostic mammogram, this showed suspicious 4 mm lower right breast mass. No abnormal appearing right axillary nodes.   07/31/2022 Pathology Results   Right breast needle core biopsy showed IDC grade 2, ER 90% pos, strong staining, PR 95% positive strong staining, Her 2 neg.   08/06/2022 Initial Diagnosis   Malignant neoplasm of lower-outer quadrant of right breast of female, estrogen receptor positive (HCC)    This is a pleasant 47 year old female patient with past medical history significant for anxiety on medication, otherwise healthy referred to breast MDC for recommendations regarding her newly diagnosed right breast invasive ductal carcinoma.  She arrived today to the appointment with her husband.  She denies any family history of breast cancer.  She has 2 children, age at first childbirth of 27, breast-fed for a few weeks.  No history of prolonged use of birth control.  She denies any changes in the breast prior to this mammogram.  She  was on Ozempic for about 8 months this year for weight loss.  She just quit using it since the diagnosis of breast cancer.  Rest of the pertinent 10 point ROS reviewed and negative  MEDICAL HISTORY:  Past Medical History:  Diagnosis Date   Abnormal Pap smear    Anxiety    Breast cancer (HCC)    Breast mass    left   Depression    GERD (gastroesophageal reflux disease)    Headache(784.0)    migraines   History of miscarriage    Incomplete RBBB    Palpitations    Pregnancy induced hypertension    Syncope and collapse    Tachycardia    Vaginal Pap smear, abnormal     SURGICAL HISTORY: Past Surgical History:  Procedure Laterality Date   BREAST BIOPSY Right 07/31/2022   [place] RT BREAST BX W LOC DEV 1ST LESION IMG BX SPEC US GUIDE 07/31/2022 [facility]   BREAST ENHANCEMENT SURGERY  2012   CHOLECYSTECTOMY     DILATION AND CURETTAGE OF UTERUS  2008   LIPOMA EXCISION  2010   left leg   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   US ECHOCARDIOGRAPHY  06/17/2007   EF 55-60%    SOCIAL HISTORY: Social History   Socioeconomic History   Marital status: Married    Spouse name: Not on file   Number of children: 1   Years of education: Not on file   Highest education level: Not on file  Occupational History    Employer: UNEMPLOYED  Tobacco Use   Smoking status: Former    Packs/day: .25    Types: Cigarettes  Smokeless tobacco: Never  Vaping Use   Vaping Use: Never used  Substance and Sexual Activity   Alcohol use: No   Drug use: No   Sexual activity: Yes    Birth control/protection: None  Other Topics Concern   Not on file  Social History Narrative   Not on file   Social Determinants of Health   Financial Resource Strain: Not on file  Food Insecurity: Not on file  Transportation Needs: Not on file  Physical Activity: Not on file  Stress: Not on file  Social Connections: Not on file  Intimate Partner Violence: Not on  file    FAMILY HISTORY: Family History  Problem Relation Age of Onset   Hypertension Mother    Hyperlipidemia Mother    Heart disease Mother    COPD Mother    Heart attack Father        X2   Heart disease Father    Hypertension Father    Colon cancer Father    Crohn's disease Brother    COPD Maternal Grandmother    Diabetes Maternal Grandmother    Stomach cancer Neg Hx    Esophageal cancer Neg Hx     ALLERGIES:  is allergic to amoxicillin, bactrim, erythromycin, and sulfa drugs cross reactors.  MEDICATIONS:  Current Outpatient Medications  Medication Sig Dispense Refill   Cholecalciferol (VITAMIN D) 125 MCG (5000 UT) CAPS Take by mouth.     escitalopram (LEXAPRO) 20 MG tablet Take 1 tablet by mouth daily.     hydrochlorothiazide (HYDRODIURIL) 12.5 MG tablet Take 12.5 mg by mouth daily.     ibuprofen (ADVIL) 800 MG tablet Take 800 mg by mouth every 8 (eight) hours as needed.     LORazepam (ATIVAN) 1 MG tablet Take 1 mg by mouth as directed.     omeprazole (PRILOSEC) 20 MG capsule Take 20 mg by mouth daily.     propranolol (INDERAL) 20 MG tablet Take 20 mg by mouth 2 (two) times daily.     No current facility-administered medications for this visit.    REVIEW OF SYSTEMS:   Constitutional: Denies fevers, chills or abnormal night sweats Eyes: Denies blurriness of vision, double vision or watery eyes Ears, nose, mouth, throat, and face: Denies mucositis or sore throat Respiratory: Denies cough, dyspnea or wheezes Cardiovascular: Denies palpitation, chest discomfort or lower extremity swelling Gastrointestinal:  Denies nausea, heartburn or change in bowel habits Skin: Denies abnormal skin rashes Lymphatics: Denies new lymphadenopathy or easy bruising Neurological:Denies numbness, tingling or new weaknesses Behavioral/Psych: Mood is stable, no new changes  Breast: Denies any palpable lumps or discharge All other systems were reviewed with the patient and are  negative.  PHYSICAL EXAMINATION: ECOG PERFORMANCE STATUS: 0 - Asymptomatic  Vitals:   08/08/22 0901  BP: (!) 143/108  Pulse: 87  Resp: 16  Temp: 98.1 F (36.7 C)  SpO2: 99%   Filed Weights   08/08/22 0901  Weight: 156 lb 14.4 oz (71.2 kg)    GENERAL:alert, no distress and comfortable Neck: No cervical adenopathy or supraclavicular adenopathy BREAST: Bilateral breasts inspected and palpated.  No palpable masses or regional adenopathy.  Postbiopsy changes with ecchymosis noted in the right breast lower quadrant. No lower extremity edema  LABORATORY DATA:  I have reviewed the data as listed Lab Results  Component Value Date   WBC 8.1 08/08/2022   HGB 15.9 (H) 08/08/2022   HCT 44.3 08/08/2022   MCV 90.4 08/08/2022   PLT 391 08/08/2022   Lab  Results  Component Value Date   NA 137 08/08/2022   K 3.8 08/08/2022   CL 99 08/08/2022   CO2 31 08/08/2022    RADIOGRAPHIC STUDIES: I have personally reviewed the radiological reports and agreed with the findings in the report.  ASSESSMENT AND PLAN:  Malignant neoplasm of lower-outer quadrant of right breast of female, estrogen receptor positive (HCC) This is a pleasant 47 year old premenopausal female patient with newly diagnosed right breast grade 2 IDC, ER/PR positive HER2 negative, low proliferation index, grade 2 DCIS referred to breast MDC for additional recommendations.  Given the tumor size under 5 mm and strong ER/PR positivity, she will proceed with upfront surgery.  If the final pathology shows tumor measuring over 5 mm, we can certainly consider Oncotype DX testing and I discussed the following details about Oncotype DX testing today.  We have discussed about Oncotype Dx score which is a well validated prognostic scoring system which can predict outcome with endocrine therapy alone and whether chemotherapy reduces recurrence.  Typically in patients with ER positive cancers that are node negative if the RS score is high  typically greater than or equal to 26, chemotherapy is recommended.  In women with intermediate recurrence score younger than 50, there can still be some role for chemotherapy in addition to endocrine therapy especially if the recurrence score is between 21-25. If chemotherapy is needed, this will precede radiation and then after radiation she will continue on antiestrogen therapy.  If she does not qualify for Oncotype DX testing then she will proceed with adjuvant radiation followed by antiestrogen therapy.  Given her premenopausal status we focused our discussion mostly on tamoxifen today.  Have discussed mechanism of action of tamoxifen, adverse effects of tamoxifen including but not limited to postmenopausal symptoms such as hot flashes, vaginal discharge, mood swings, increased risk of DVT/PE and rarely endometrial hyperplasia and endometrial carcinoma.  The benefit of tamoxifen would be improvement in bone density.  She will start antiestrogen therapy after adjuvant radiation.  She will return to clinic after radiation to initiate antiestrogen therapy.  She is agreeable to all these recommendations.  Will plan to see her after surgery to review any additional recommendations.  Thank you for consulting [place] in the care of this patient.  Please do not hesitate to contact us with any additional questions or concerns.  Total time spent: 45 min All questions were answered. The patient knows to call the clinic with any problems, questions or concerns.    Rachel Moulds, MD 08/08/22

## 2022-08-08 NOTE — Progress Notes (Addendum)
REFERRING PROVIDER: Rachel Moulds, MD  PRIMARY PROVIDER:  Ailene Ravel, MD  PRIMARY REASON FOR VISIT:  1. Malignant neoplasm of lower-outer quadrant of right breast of female, estrogen receptor positive (HCC)   2. Family history of colon cancer in father    HISTORY OF PRESENT ILLNESS:   [name], a 47 y.o. female, was seen for a [facility] cancer genetics consultation during the breast multidisciplinary clinic at the request of Dr. Al Pimple due to a personal and family history of cancer.  [name] presents to clinic today to discuss the possibility of a hereditary predisposition to cancer, to discuss genetic testing, and to further clarify her future cancer risks, as well as potential cancer risks for family members.   In June 2024, at the age of 34, [name] was diagnosed with invasive ductal carcinoma of the right breast (ER/PR positive, HER2 negative).  CANCER HISTORY:  Oncology History  Malignant neoplasm of lower-outer quadrant of right breast of female, estrogen receptor positive (HCC)  07/27/2022 Mammogram   Patient had screening mammogram recall, underwent diagnostic mammogram, this showed suspicious 4 mm lower right breast mass. No abnormal appearing right axillary nodes.   07/31/2022 Pathology Results   Right breast needle core biopsy showed IDC grade 2, ER 90% pos, strong staining, PR 95% positive strong staining, Her 2 neg.   08/06/2022 Initial Diagnosis   Malignant neoplasm of lower-outer quadrant of right breast of female, estrogen receptor positive (HCC)     Past Medical History:  Diagnosis Date   Abnormal Pap smear    Anxiety    Breast cancer (HCC)    Breast mass    left   Depression    GERD (gastroesophageal reflux disease)    Headache(784.0)    migraines   History of miscarriage    Incomplete RBBB    Palpitations    Pregnancy induced hypertension    Syncope and collapse    Tachycardia    Vaginal Pap smear, abnormal     Past Surgical History:   Procedure Laterality Date   BREAST BIOPSY Right 07/31/2022   [place] RT BREAST BX W LOC DEV 1ST LESION IMG BX SPEC US GUIDE 07/31/2022 [facility]   BREAST ENHANCEMENT SURGERY  2012   CHOLECYSTECTOMY     DILATION AND CURETTAGE OF UTERUS  2008   LIPOMA EXCISION  2010   left leg   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   US ECHOCARDIOGRAPHY  06/17/2007   EF 55-60%    Social History   Socioeconomic History   Marital status: Married    Spouse name: Not on file   Number of children: 1   Years of education: Not on file   Highest education level: Not on file  Occupational History    Employer: UNEMPLOYED  Tobacco Use   Smoking status: Former    Packs/day: .25    Types: Cigarettes   Smokeless tobacco: Never  Vaping Use   Vaping Use: Never used  Substance and Sexual Activity   Alcohol use: No   Drug use: No   Sexual activity: Yes    Birth control/protection: None  Other Topics Concern   Not on file  Social History Narrative   Not on file   Social Determinants of Health   Financial Resource Strain: Not on file  Food Insecurity: No Food Insecurity (08/08/2022)   Hunger Vital Sign    Worried About Running Out of Food in the Last Year:  Never true    Ran Out of Food in the Last Year: Never true  Transportation Needs: No Transportation Needs (08/08/2022)   PRAPARE - Administrator, Civil Service (Medical): No    Lack of Transportation (Non-Medical): No  Physical Activity: Not on file  Stress: Not on file  Social Connections: Not on file     FAMILY HISTORY:  We obtained a detailed, 4-generation family history.  Significant diagnoses are listed below:  [name] father was diagnosed with colon cancer at age 36, he is currently 52.  [name] is unaware of previous family history of genetic testing for hereditary cancer risks. There is no reported Ashkenazi Jewish ancestry.      GENETIC COUNSELING ASSESSMENT: [name]  is a 47 y.o. female with a personal and family history of cancer which is somewhat suggestive of a hereditary cancer syndrome and predisposition to cancer given her young age at diagnosis (<50). We, therefore, discussed and recommended the following at today's visit.   DISCUSSION: We discussed that 5 - 10% of cancer is hereditary, with most cases of hereditary breast cancer associated with mutations in BRCA1/2.  There are other genes that can be associated with hereditary breast cancer syndromes. Type of cancer risk and level of risk are gene-specific. We discussed that testing is beneficial for several reasons including knowing how to follow individuals after completing their treatment, identifying whether potential treatment options would be beneficial, and understanding if other family members could be at risk for cancer and allowing them to undergo genetic testing.   We reviewed the characteristics, features and inheritance patterns of hereditary cancer syndromes. We also discussed genetic testing, including the appropriate family members to test, the process of testing, insurance coverage and turn-around-time for results. We discussed the implications of a negative, positive and/or variant of uncertain significant result. In order to get genetic test results in a timely manner so that [name] can use these genetic test results for surgical decisions, we recommended [name] pursue genetic testing for the Breast Cancer STAT Panel. Once complete, we recommend [name] pursue reflex genetic testing to a more comprehensive gene panel.   [name] elected to have [facility] Common Cancer Panel. The Common Hereditary Cancers Panel offered by [facility] includes sequencing and/or deletion duplication testing of the following 48 genes: APC, ATM, AXIN2, BAP1, BARD1, BMPR1A, BRCA1, BRCA2, BRIP1, CDH1, CDK4, CDKN2A (p14ARF and p16INK4a only), CHEK2, CTNNA1, DICER1, EPCAM (Deletion/duplication testing only), FH, GREM1  (promoter region duplication testing only), HOXB13, KIT, MBD4, MEN1, MLH1, MSH2, MSH3, MSH6, MUTYH, NF1, NHTL1, PALB2, PDGFRA, PMS2, POLD1, POLE, PTEN, RAD51C, RAD51D, SDHA (sequencing analysis only except exon 14), SDHB, SDHC, SDHD, SMAD4, SMARCA4. STK11, TP53, TSC1, TSC2, and VHL.  Based on [name]'s personal and family history of cancer, she meets medical criteria for genetic testing. Despite that she meets criteria, she may still have an out of pocket cost. We discussed that if her out of pocket cost for testing is over $100, the laboratory should contact them to discuss self-pay prices, patient pay assistance programs, if applicable, and other billing options.   PLAN: After considering the risks, benefits, and limitations, [name] provided informed consent to pursue genetic testing and the blood sample was sent to [facility] for analysis of the Common Cancer Panel. Results should be available within approximately 1-2 weeks' time, at which point they will be disclosed by telephone to [name], as will any additional recommendations warranted by these results. Ms.  [name] will receive a summary of her genetic counseling visit and a copy of her results once available. This information will also be available in Epic.   [name] questions were answered to her satisfaction today. Our contact information was provided should additional questions or concerns arise. Thank you for the referral and allowing [place] to share in the care of your patient.   Lalla Brothers, MS, [facility] Genetic Counselor [email or web address] (P) [phone]  The patient was seen for a total of 20 minutes in face-to-face genetic counseling.  The patient brought her husband. Drs. Pamelia Hoit and/or Mosetta Putt were available to discuss this case as needed.  _______________________________________________________________________ For Office Staff:  Number of people involved in session: 2 Was an Intern/ student involved with case:  no

## 2022-08-10 ENCOUNTER — Other Ambulatory Visit: Payer: Self-pay | Admitting: General Surgery

## 2022-08-10 DIAGNOSIS — C50511 Malignant neoplasm of lower-outer quadrant of right female breast: Secondary | ICD-10-CM

## 2022-08-13 ENCOUNTER — Encounter (HOSPITAL_BASED_OUTPATIENT_CLINIC_OR_DEPARTMENT_OTHER): Payer: Self-pay | Admitting: General Surgery

## 2022-08-13 ENCOUNTER — Telehealth: Payer: Self-pay | Admitting: Radiology

## 2022-08-13 ENCOUNTER — Other Ambulatory Visit: Payer: Self-pay

## 2022-08-13 ENCOUNTER — Telehealth: Payer: Self-pay

## 2022-08-13 DIAGNOSIS — I10 Essential (primary) hypertension: Secondary | ICD-10-CM | POA: Diagnosis not present

## 2022-08-13 DIAGNOSIS — K219 Gastro-esophageal reflux disease without esophagitis: Secondary | ICD-10-CM | POA: Diagnosis not present

## 2022-08-13 DIAGNOSIS — Z79899 Other long term (current) drug therapy: Secondary | ICD-10-CM | POA: Diagnosis not present

## 2022-08-13 DIAGNOSIS — C50911 Malignant neoplasm of unspecified site of right female breast: Secondary | ICD-10-CM | POA: Diagnosis not present

## 2022-08-13 DIAGNOSIS — Z13220 Encounter for screening for lipoid disorders: Secondary | ICD-10-CM | POA: Diagnosis not present

## 2022-08-13 DIAGNOSIS — G43909 Migraine, unspecified, not intractable, without status migrainosus: Secondary | ICD-10-CM | POA: Diagnosis not present

## 2022-08-13 DIAGNOSIS — R002 Palpitations: Secondary | ICD-10-CM | POA: Diagnosis not present

## 2022-08-13 DIAGNOSIS — F419 Anxiety disorder, unspecified: Secondary | ICD-10-CM | POA: Diagnosis not present

## 2022-08-13 NOTE — Patient Outreach (Signed)
  Care Coordination   08/13/2022 Name: [name] MRN: [id number] DOB: 03-07-1975   Care Coordination Outreach Attempts:  An unsuccessful telephone outreach was attempted today to offer the patient information about available care coordination services.  Follow Up Plan:  Additional outreach attempts will be made to offer the patient care coordination information and services.   Encounter Outcome:  No Answer   Care Coordination Interventions:  No, not indicated    Rowe Pavy, RN, BSN, [facility] [employer] [phone]

## 2022-08-13 NOTE — Progress Notes (Signed)
   08/13/22 1042  PAT Phone Screen  Is the patient taking a GLP-1 receptor agonist? No  Do You Have Diabetes? No  Do You Have Hypertension? Yes  Have You Ever Been to the ER for Asthma? No  Have You Taken Oral Steroids in the Past 3 Months? No  Do you Take Phenteramine or any Other Diet Drugs? No  Recent  Lab Work, EKG, CXR? Yes  Where was this test performed? [facility] 08/08/22  Cardiologist Name Last ov w/ cardiology 04/18/2021. Evaluated by Dr Mayford Knife for hx of incomplete RBBB, palpitations. Refered to GI and pulmonary after negative work up.  Have you ever had tests on your heart? Yes  What cardiac tests were performed? Echo  What date/year were cardiac tests completed? EF 60-65% 05/10/2021 Xio monitor study WNL w/ rare pac's and pvc's  Results viewable: [facility] Media Tab  Any Recent Hospitalizations? No  Height 5\' 6"  (1.676 m)  Weight 71.2 kg  Pat Appointment Scheduled Yes (EKG)

## 2022-08-13 NOTE — Telephone Encounter (Signed)
[employer] 2021-05 - Specimen Collection Study to Evaluate Biomarkers in Subjects with Cancer    08/13/22  PHONE CALL: Confirmed I was speaking with [name] . Informed patient reason for call is to follow-up on the above mentioned study. This coordinator reviewed study with patient. Patient expressed interest and stated she would be able to complete research visit on Wednesday, 6/26. Thanked patient for her time and support of the above mentioned study. Look forward to meeting her then.   Merri Brunette, RT(R)(T) Clinical Research Coordinator

## 2022-08-15 ENCOUNTER — Inpatient Hospital Stay: Payer: 59 | Attending: Hematology and Oncology | Admitting: Radiology

## 2022-08-15 ENCOUNTER — Inpatient Hospital Stay: Payer: 59

## 2022-08-15 ENCOUNTER — Other Ambulatory Visit: Payer: Self-pay

## 2022-08-15 DIAGNOSIS — C50911 Malignant neoplasm of unspecified site of right female breast: Secondary | ICD-10-CM | POA: Diagnosis not present

## 2022-08-15 DIAGNOSIS — Z170 Estrogen receptor positive status [ER+]: Secondary | ICD-10-CM

## 2022-08-15 LAB — RESEARCH LABS

## 2022-08-15 NOTE — Research (Signed)
[employer] 2021-05 - Specimen Collection Study to Evaluate Biomarkers in Subjects with Cancer   This Nurse has reviewed this patient's inclusion and exclusion criteria as a second review and confirms [name] is eligible for study participation. Patient may continue with enrollment.  Juanita Laster, RN, BSN, CPN Clinical Research Nurse I [phone]  08/15/2022 1:19 PM

## 2022-08-15 NOTE — Research (Signed)
[employer] 2021-05 - Specimen Collection Study to Evaluate Biomarkers in Subjects with Cancer    08/15/22  ELIGIBILITY:  This Coordinator has reviewed this patient's inclusion and exclusion criteria and confirmed [name] is eligible for study participation.  Patient will continue with enrollment.  Menopausal status (women only): [name] is pre-menopausal with LMP 07/16/2022.   Eligibility confirmed by treating investigator, who also agrees that patient should proceed with enrollment.   CONSENT:  Patient [name] was identified by Dr. Al Pimple as a potential candidate for the above listed study.  This Clinical Research Coordinator met with Dim Meisinger, [id number] on 08/15/22 in a manner and location that ensures patient privacy to discuss participation in the above listed research study.  Patient is Unaccompanied.  Patient was previously provided with informed consent documents.  Patient has not yet read the informed consent documents and so documents were reviewed page by page today.  As outlined in the informed consent form, this Coordinator and Waniya Hoglund discussed the purpose of the research study, the investigational nature of the study, study procedures and requirements for study participation, potential risks and benefits of study participation, as well as alternatives to participation.  This study is not blinded or double-blinded. The patient understands participation is voluntary and they may withdraw from study participation at any time.  This study does not involve randomization.  This study does not involve an investigational drug or device. This study does not involve a placebo. Patient understands enrollment is pending full eligibility review.   Confidentiality and how the patient's information will be used as part of study participation were discussed.  Patient was informed there is reimbursement provided for their time and effort spent on trial participation.   The patient is encouraged to discuss research study participation with their insurance provider to determine what costs they may incur as part of study participation, including research related injury.    All questions were answered to patient's satisfaction.  The informed consent with embedded HIPAA language was reviewed page by page.  The patient's mental and emotional status is appropriate to provide informed consent, and the patient verbalizes an understanding of study participation.  Patient has agreed to participate in the above listed research study and has voluntarily signed the informed consent version 04 Mar 2020 with embedded HIPAA language, version 04 Mar 2020  on 08/15/22 at 1210PM.  The patient was provided with a copy of the signed informed consent form with embedded HIPAA language for their reference.  No study specific procedures were obtained prior to the signing of the informed consent document.  Approximately 20 minutes were spent with the patient reviewing the informed consent documents.  Patient was not requested to complete a Release of Information form.  After completion of consent, patient provided medical history as follows:    Medical History:  High Blood Pressure  Yes  Coronary Artery Disease No Lupus    No Rheumatoid Arthritis  No Diabetes   No       Lynch Syndrome  No  Is the patient currently taking a magnesium supplement?   No  Does the patient have a personal history of cancer (greater than 5 years ago)?  No  Does the patient have a family history of cancer in 1st or 2nd degree relatives? Yes If yes, Relationship(s) and Cancer type(s)? Dad: colon  Does the patient have history of alcohol consumption? No    Does the patient have history of cigarette, cigar, pipe, or chewing tobacco use?  No   Patient was then escorted to the lab area by Suezanne Cheshire, Arts development officer.  Blood Collection: Research blood obtained by [facility] venipuncture. Patient tolerated  well without any adverse events. Gift Card: $50 gift card given to patient for her participation in this study.    Patient was thanked for her time and support of the above mentioned study.   Merri Brunette, RT(R)(T) Clinical Research Coordinator

## 2022-08-16 ENCOUNTER — Telehealth: Payer: Self-pay | Admitting: Genetic Counselor

## 2022-08-16 ENCOUNTER — Encounter: Payer: Self-pay | Admitting: Genetic Counselor

## 2022-08-16 ENCOUNTER — Encounter (HOSPITAL_BASED_OUTPATIENT_CLINIC_OR_DEPARTMENT_OTHER)
Admission: RE | Admit: 2022-08-16 | Discharge: 2022-08-16 | Disposition: A | Discharge status: Home / Self Care | Payer: 59 | Source: Ambulatory Visit | Attending: General Surgery | Admitting: General Surgery

## 2022-08-16 ENCOUNTER — Ambulatory Visit
Admission: RE | Admit: 2022-08-16 | Discharge: 2022-08-16 | Disposition: A | Discharge status: Home / Self Care | Payer: 59 | Source: Ambulatory Visit | Attending: General Surgery | Admitting: General Surgery

## 2022-08-16 DIAGNOSIS — Z01812 Encounter for preprocedural laboratory examination: Secondary | ICD-10-CM | POA: Insufficient documentation

## 2022-08-16 DIAGNOSIS — C50911 Malignant neoplasm of unspecified site of right female breast: Secondary | ICD-10-CM | POA: Diagnosis not present

## 2022-08-16 DIAGNOSIS — Z170 Estrogen receptor positive status [ER+]: Secondary | ICD-10-CM

## 2022-08-16 DIAGNOSIS — Z1379 Encounter for other screening for genetic and chromosomal anomalies: Secondary | ICD-10-CM | POA: Insufficient documentation

## 2022-08-16 HISTORY — PX: BREAST BIOPSY: SHX20

## 2022-08-16 MED ORDER — CHLORHEXIDINE GLUCONATE CLOTH 2 % EX PADS: 6.0000 | MEDICATED_PAD | Freq: Once | CUTANEOUS | Status: DC

## 2022-08-16 NOTE — Telephone Encounter (Addendum)
I contacted Ms. Gentzler to discuss her genetic testing results. No pathogenic variants were identified in the 9 genes analyzed. Of note, we are still waiting on the pan-cancer panel. Detailed clinic note to follow.  The test report has been scanned into EPIC and is located under the Molecular Pathology section of the Results Review tab.  A portion of the result report is included below for reference.   Lalla Brothers, MS, [facility] Genetic Counselor [email or web address] (P) [phone]

## 2022-08-16 NOTE — Progress Notes (Signed)

## 2022-08-19 NOTE — Anesthesia Preprocedure Evaluation (Signed)
Anesthesia Evaluation  Patient identified by MRN, date of birth, ID band Patient awake    Reviewed: Allergy & Precautions, NPO status , Patient's Chart, lab work & pertinent test results  Airway Mallampati: II  TM Distance: >3 FB Neck ROM: Full    Dental no notable dental hx.    Pulmonary former smoker   Pulmonary exam normal        Cardiovascular hypertension, Pt. on medications and Pt. on home beta blockers + dysrhythmias  Rhythm:Regular Rate:Normal     Neuro/Psych  Headaches  Anxiety Depression       GI/Hepatic Neg liver ROS,GERD  Medicated,,  Endo/Other  negative endocrine ROS    Renal/GU negative Renal ROS  negative genitourinary   Musculoskeletal Breast Ca   Abdominal Normal abdominal exam  (+)   Peds  Hematology negative hematology ROS (+)   Anesthesia Other Findings   Reproductive/Obstetrics                             Anesthesia Physical Anesthesia Plan  ASA: 2  Anesthesia Plan: General and Regional   Post-op Pain Management: Regional block*   Induction: Intravenous  PONV Risk Score and Plan: 3 and Ondansetron, Dexamethasone, Midazolam and Treatment may vary due to age or medical condition  Airway Management Planned: Mask and LMA  Additional Equipment: None  Intra-op Plan:   Post-operative Plan: Extubation in OR  Informed Consent: I have reviewed the patients History and Physical, chart, labs and discussed the procedure including the risks, benefits and alternatives for the proposed anesthesia with the patient or authorized representative who has indicated his/her understanding and acceptance.     Dental advisory given  Plan Discussed with: CRNA  Anesthesia Plan Comments:        Anesthesia Quick Evaluation

## 2022-08-20 ENCOUNTER — Ambulatory Visit (HOSPITAL_COMMUNITY)
Admission: RE | Admit: 2022-08-20 | Discharge: 2022-08-20 | Disposition: A | Discharge status: Home / Self Care | Payer: 59 | Attending: General Surgery | Admitting: General Surgery

## 2022-08-20 ENCOUNTER — Other Ambulatory Visit: Payer: Self-pay

## 2022-08-20 ENCOUNTER — Encounter (HOSPITAL_BASED_OUTPATIENT_CLINIC_OR_DEPARTMENT_OTHER): Payer: Self-pay | Admitting: General Surgery

## 2022-08-20 ENCOUNTER — Ambulatory Visit (HOSPITAL_BASED_OUTPATIENT_CLINIC_OR_DEPARTMENT_OTHER): Payer: 59 | Admitting: Anesthesiology

## 2022-08-20 ENCOUNTER — Ambulatory Visit
Admission: RE | Admit: 2022-08-20 | Discharge: 2022-08-20 | Disposition: A | Discharge status: Home / Self Care | Payer: 59 | Source: Ambulatory Visit | Attending: General Surgery | Admitting: General Surgery

## 2022-08-20 ENCOUNTER — Ambulatory Visit (HOSPITAL_COMMUNITY)
Admission: RE | Admit: 2022-08-20 | Discharge: 2022-08-20 | Disposition: A | Discharge status: Home / Self Care | Payer: 59 | Source: Ambulatory Visit | Attending: General Surgery | Admitting: General Surgery

## 2022-08-20 ENCOUNTER — Telehealth: Payer: Self-pay | Admitting: Genetic Counselor

## 2022-08-20 ENCOUNTER — Encounter (HOSPITAL_BASED_OUTPATIENT_CLINIC_OR_DEPARTMENT_OTHER)
Admission: RE | Disposition: A | Discharge status: Home / Self Care | Payer: Self-pay | Source: Home / Self Care | Attending: General Surgery

## 2022-08-20 DIAGNOSIS — F32A Depression, unspecified: Secondary | ICD-10-CM | POA: Diagnosis not present

## 2022-08-20 DIAGNOSIS — R519 Headache, unspecified: Secondary | ICD-10-CM | POA: Insufficient documentation

## 2022-08-20 DIAGNOSIS — C50511 Malignant neoplasm of lower-outer quadrant of right female breast: Secondary | ICD-10-CM | POA: Diagnosis not present

## 2022-08-20 DIAGNOSIS — Z170 Estrogen receptor positive status [ER+]: Secondary | ICD-10-CM | POA: Insufficient documentation

## 2022-08-20 DIAGNOSIS — G8918 Other acute postprocedural pain: Secondary | ICD-10-CM | POA: Diagnosis not present

## 2022-08-20 DIAGNOSIS — Z87891 Personal history of nicotine dependence: Secondary | ICD-10-CM | POA: Insufficient documentation

## 2022-08-20 DIAGNOSIS — Z01818 Encounter for other preprocedural examination: Secondary | ICD-10-CM

## 2022-08-20 DIAGNOSIS — F419 Anxiety disorder, unspecified: Secondary | ICD-10-CM | POA: Diagnosis not present

## 2022-08-20 DIAGNOSIS — K219 Gastro-esophageal reflux disease without esophagitis: Secondary | ICD-10-CM | POA: Insufficient documentation

## 2022-08-20 DIAGNOSIS — I10 Essential (primary) hypertension: Secondary | ICD-10-CM | POA: Diagnosis not present

## 2022-08-20 DIAGNOSIS — C50911 Malignant neoplasm of unspecified site of right female breast: Secondary | ICD-10-CM

## 2022-08-20 HISTORY — DX: Essential (primary) hypertension: I10

## 2022-08-20 HISTORY — PX: BREAST LUMPECTOMY WITH RADIOACTIVE SEED AND SENTINEL LYMPH NODE BIOPSY: SHX6550

## 2022-08-20 LAB — POCT PREGNANCY, URINE: Preg Test, Ur: NEGATIVE

## 2022-08-20 SURGERY — BREAST LUMPECTOMY WITH RADIOACTIVE SEED AND SENTINEL LYMPH NODE BIOPSY
Anesthesia: Regional | Site: Breast | Laterality: Right

## 2022-08-20 MED ORDER — DEXAMETHASONE SODIUM PHOSPHATE 10 MG/ML IJ SOLN
INTRAMUSCULAR | Status: DC | PRN
  Administered 2022-08-20: 10 mg via INTRAVENOUS

## 2022-08-20 MED ORDER — OXYCODONE HCL 5 MG PO TABS: 5.0000 mg | ORAL_TABLET | Freq: Once | ORAL | Status: DC | PRN

## 2022-08-20 MED ORDER — FENTANYL CITRATE (PF) 100 MCG/2ML IJ SOLN: 25.0000 ug | INTRAMUSCULAR | Status: DC | PRN

## 2022-08-20 MED ORDER — DEXAMETHASONE SODIUM PHOSPHATE 10 MG/ML IJ SOLN
INTRAMUSCULAR | Status: AC
  Filled 2022-08-20: qty 1

## 2022-08-20 MED ORDER — PROPOFOL 10 MG/ML IV BOLUS
INTRAVENOUS | Status: AC
  Filled 2022-08-20: qty 20

## 2022-08-20 MED ORDER — BUPIVACAINE-EPINEPHRINE (PF) 0.25% -1:200000 IJ SOLN
INTRAMUSCULAR | Status: AC
  Filled 2022-08-20: qty 30

## 2022-08-20 MED ORDER — EPHEDRINE SULFATE-NACL 50-0.9 MG/10ML-% IV SOSY
PREFILLED_SYRINGE | INTRAVENOUS | Status: DC | PRN
  Administered 2022-08-20: 10 mg via INTRAVENOUS
  Administered 2022-08-20 (×2): 5 mg via INTRAVENOUS

## 2022-08-20 MED ORDER — EPHEDRINE 5 MG/ML INJ
INTRAVENOUS | Status: AC
  Filled 2022-08-20: qty 5

## 2022-08-20 MED ORDER — MIDAZOLAM HCL 2 MG/2ML IJ SOLN
INTRAMUSCULAR | Status: AC
  Filled 2022-08-20: qty 2

## 2022-08-20 MED ORDER — GABAPENTIN 300 MG PO CAPS
ORAL_CAPSULE | ORAL | Status: AC
  Filled 2022-08-20: qty 1

## 2022-08-20 MED ORDER — ONDANSETRON HCL 4 MG/2ML IJ SOLN
INTRAMUSCULAR | Status: DC | PRN
  Administered 2022-08-20: 4 mg via INTRAVENOUS

## 2022-08-20 MED ORDER — OXYCODONE HCL 5 MG PO TABS
5.0000 mg | ORAL_TABLET | Freq: Four times a day (QID) | ORAL | 0 refills | Status: DC | PRN
Start: 2022-08-20 — End: 2023-03-04

## 2022-08-20 MED ORDER — LIDOCAINE 2% (20 MG/ML) 5 ML SYRINGE
INTRAMUSCULAR | Status: AC
  Filled 2022-08-20: qty 5

## 2022-08-20 MED ORDER — LACTATED RINGERS IV SOLN: INTRAVENOUS | Status: DC

## 2022-08-20 MED ORDER — ONDANSETRON HCL 4 MG/2ML IJ SOLN
INTRAMUSCULAR | Status: AC
  Filled 2022-08-20: qty 2

## 2022-08-20 MED ORDER — MIDAZOLAM HCL 2 MG/2ML IJ SOLN
2.0000 mg | Freq: Once | INTRAMUSCULAR | Status: AC
  Administered 2022-08-20: 2 mg via INTRAVENOUS

## 2022-08-20 MED ORDER — ACETAMINOPHEN 500 MG PO TABS
1000.0000 mg | ORAL_TABLET | ORAL | Status: AC
  Administered 2022-08-20: 1000 mg via ORAL

## 2022-08-20 MED ORDER — ROCURONIUM BROMIDE 10 MG/ML (PF) SYRINGE
PREFILLED_SYRINGE | INTRAVENOUS | Status: AC
  Filled 2022-08-20: qty 10

## 2022-08-20 MED ORDER — VANCOMYCIN HCL IN DEXTROSE 1-5 GM/200ML-% IV SOLN
1000.0000 mg | INTRAVENOUS | Status: AC
  Administered 2022-08-20: 1000 mg via INTRAVENOUS

## 2022-08-20 MED ORDER — LIDOCAINE 2% (20 MG/ML) 5 ML SYRINGE
INTRAMUSCULAR | Status: DC | PRN
  Administered 2022-08-20: 60 mg via INTRAVENOUS

## 2022-08-20 MED ORDER — TECHNETIUM TC 99M TILMANOCEPT KIT: 1.0000 | PACK | Freq: Once | INTRAVENOUS | Status: DC | PRN

## 2022-08-20 MED ORDER — PROPOFOL 10 MG/ML IV BOLUS
INTRAVENOUS | Status: DC | PRN
  Administered 2022-08-20: 200 mg via INTRAVENOUS

## 2022-08-20 MED ORDER — FENTANYL CITRATE (PF) 100 MCG/2ML IJ SOLN
INTRAMUSCULAR | Status: AC
  Filled 2022-08-20: qty 2

## 2022-08-20 MED ORDER — ACETAMINOPHEN 500 MG PO TABS
ORAL_TABLET | ORAL | Status: AC
  Filled 2022-08-20: qty 2

## 2022-08-20 MED ORDER — FENTANYL CITRATE (PF) 100 MCG/2ML IJ SOLN
100.0000 ug | Freq: Once | INTRAMUSCULAR | Status: AC
  Administered 2022-08-20: 100 ug via INTRAVENOUS

## 2022-08-20 MED ORDER — BUPIVACAINE-EPINEPHRINE 0.25% -1:200000 IJ SOLN
INTRAMUSCULAR | Status: DC | PRN
  Administered 2022-08-20: 10 mL

## 2022-08-20 MED ORDER — VANCOMYCIN HCL IN DEXTROSE 1-5 GM/200ML-% IV SOLN
INTRAVENOUS | Status: AC
  Filled 2022-08-20: qty 200

## 2022-08-20 MED ORDER — ROPIVACAINE HCL 5 MG/ML IJ SOLN
INTRAMUSCULAR | Status: DC | PRN
  Administered 2022-08-20: 30 mL

## 2022-08-20 MED ORDER — GABAPENTIN 300 MG PO CAPS
300.0000 mg | ORAL_CAPSULE | ORAL | Status: AC
  Administered 2022-08-20: 300 mg via ORAL

## 2022-08-20 MED ORDER — BUPIVACAINE HCL (PF) 0.25 % IJ SOLN
INTRAMUSCULAR | Status: AC
  Filled 2022-08-20: qty 60

## 2022-08-20 MED ORDER — DEXAMETHASONE SODIUM PHOSPHATE 10 MG/ML IJ SOLN
INTRAMUSCULAR | Status: DC | PRN
  Administered 2022-08-20: 10 mg

## 2022-08-20 MED ORDER — ACETAMINOPHEN 500 MG PO TABS: 1000.0000 mg | ORAL_TABLET | Freq: Once | ORAL | Status: DC

## 2022-08-20 MED ORDER — OXYCODONE HCL 5 MG/5ML PO SOLN: 5.0000 mg | Freq: Once | ORAL | Status: DC | PRN

## 2022-08-20 MED ORDER — FENTANYL CITRATE (PF) 100 MCG/2ML IJ SOLN
INTRAMUSCULAR | Status: DC | PRN
  Administered 2022-08-20 (×2): 25 ug via INTRAVENOUS

## 2022-08-20 SURGICAL SUPPLY — 42 items
ADH SKN CLS APL DERMABOND .7 (GAUZE/BANDAGES/DRESSINGS) ×1
APL PRP STRL LF DISP 70% ISPRP (MISCELLANEOUS) ×1
APPLIER CLIP 9.375 MED OPEN (MISCELLANEOUS) ×2
APR CLP MED 9.3 20 MLT OPN (MISCELLANEOUS) ×2
BLADE SURG 15 STRL LF DISP TIS (BLADE) ×1 IMPLANT
BLADE SURG 15 STRL SS (BLADE) ×1
CANISTER SUC SOCK COL 7IN (MISCELLANEOUS) IMPLANT
CANISTER SUCT 1200ML W/VALVE (MISCELLANEOUS) IMPLANT
CHLORAPREP W/TINT 26 (MISCELLANEOUS) ×1 IMPLANT
CLIP APPLIE 9.375 MED OPEN (MISCELLANEOUS) ×1 IMPLANT
COVER BACK TABLE 60X90IN (DRAPES) ×1 IMPLANT
COVER MAYO STAND STRL (DRAPES) ×1 IMPLANT
COVER PROBE CYLINDRICAL 5X96 (MISCELLANEOUS) ×1 IMPLANT
DERMABOND ADVANCED .7 DNX12 (GAUZE/BANDAGES/DRESSINGS) ×1 IMPLANT
DRAPE LAPAROSCOPIC ABDOMINAL (DRAPES) ×1 IMPLANT
DRAPE UTILITY XL STRL (DRAPES) ×1 IMPLANT
ELECT COATED BLADE 2.86 ST (ELECTRODE) ×1 IMPLANT
ELECT REM PT RETURN 9FT ADLT (ELECTROSURGICAL) ×1
ELECTRODE REM PT RTRN 9FT ADLT (ELECTROSURGICAL) ×1 IMPLANT
GLOVE BIO SURGEON STRL SZ7.5 (GLOVE) ×1 IMPLANT
GLOVE BIOGEL PI IND STRL 7.0 (GLOVE) IMPLANT
GLOVE ECLIPSE 6.5 STRL STRAW (GLOVE) IMPLANT
GOWN STRL REUS W/ TWL LRG LVL3 (GOWN DISPOSABLE) ×2 IMPLANT
GOWN STRL REUS W/TWL LRG LVL3 (GOWN DISPOSABLE) ×3
KIT MARKER MARGIN INK (KITS) ×1 IMPLANT
NDL HYPO 25X1 1.5 SAFETY (NEEDLE) ×1 IMPLANT
NDL SAFETY ECLIP 18X1.5 (MISCELLANEOUS) IMPLANT
NEEDLE HYPO 25X1 1.5 SAFETY (NEEDLE) ×1 IMPLANT
NS IRRIG 1000ML POUR BTL (IV SOLUTION) IMPLANT
PACK BASIN DAY SURGERY FS (CUSTOM PROCEDURE TRAY) ×1 IMPLANT
PENCIL SMOKE EVACUATOR (MISCELLANEOUS) ×1 IMPLANT
SLEEVE SCD COMPRESS KNEE MED (STOCKING) ×1 IMPLANT
SPIKE FLUID TRANSFER (MISCELLANEOUS) IMPLANT
SPONGE T-LAP 18X18 ~~LOC~~+RFID (SPONGE) ×1 IMPLANT
SUT MON AB 4-0 PC3 18 (SUTURE) ×2 IMPLANT
SUT SILK 2 0 SH (SUTURE) IMPLANT
SUT VICRYL 3-0 CR8 SH (SUTURE) ×1 IMPLANT
SYR CONTROL 10ML LL (SYRINGE) ×1 IMPLANT
TOWEL GREEN STERILE FF (TOWEL DISPOSABLE) ×1 IMPLANT
TRAY FAXITRON CT DISP (TRAY / TRAY PROCEDURE) ×1 IMPLANT
TUBE CONNECTING 20X1/4 (TUBING) IMPLANT
YANKAUER SUCT BULB TIP NO VENT (SUCTIONS) IMPLANT

## 2022-08-20 NOTE — Transfer of Care (Signed)
Immediate Anesthesia Transfer of Care Note  Patient: [name]  Procedure(s) Performed: RIGHT BREAST LUMPECTOMY WITH RADIOACTIVE SEED AND SENTINEL LYMPH NODE BIOPSY (Right: Breast)  Patient Location: PACU  Anesthesia Type:GA combined with regional for post-op pain  Level of Consciousness: drowsy  Airway & Oxygen Therapy: Patient Spontanous Breathing and Patient connected to face mask oxygen  Post-op Assessment: Report given to RN and Post -op Vital signs reviewed and stable  Post vital signs: Reviewed and stable  Last Vitals:  Vitals Value Taken Time  BP 126/82 08/20/22 1153  Temp    Pulse 89 08/20/22 1153  Resp 14 08/20/22 1153  SpO2 100 % 08/20/22 1153  Vitals shown include unvalidated device data.  Last Pain:  Vitals:   08/20/22 0806  TempSrc: Oral  PainSc: 0-No pain      Patients Stated Pain Goal: 7 (08/20/22 0806)  Complications: No notable events documented.

## 2022-08-20 NOTE — Discharge Instructions (Signed)
Regional Anesthesia Blocks  1. Numbness or the inability to move the "blocked" extremity may last from 3-48 hours after placement. The length of time depends on the medication injected and your individual response to the medication. If the numbness is not going away after 48 hours, call your surgeon.  2. The extremity that is blocked will need to be protected until the numbness is gone and the  Strength has returned. Because you cannot feel it, you will need to take extra care to avoid injury. Because it may be weak, you may have difficulty moving it or using it. You may not know what position it is in without looking at it while the block is in effect.  3. For blocks in the legs and feet, returning to weight bearing and walking needs to be done carefully. You will need to wait until the numbness is entirely gone and the strength has returned. You should be able to move your leg and foot normally before you try and bear weight or walk. You will need someone to be with you when you first try to ensure you do not fall and possibly risk injury.  4. Bruising and tenderness at the needle site are common side effects and will resolve in a few days.  5. Persistent numbness or new problems with movement should be communicated to the surgeon or the [facility] [phone] [facility] [phone]).  Post Anesthesia Home Care Instructions  Activity: Get plenty of rest for the remainder of the day. A responsible individual must stay with you for 24 hours following the procedure.  For the next 24 hours, DO NOT: -Drive a car -Advertising copywriter -Drink alcoholic beverages -Take any medication unless instructed by your physician -Make any legal decisions or sign important papers.  Meals: Start with liquid foods such as gelatin or soup. Progress to regular foods as tolerated. Avoid greasy, spicy, heavy foods. If nausea and/or vomiting occur, drink only clear liquids until the  nausea and/or vomiting subsides. Call your physician if vomiting continues.  Special Instructions/Symptoms: Your throat may feel dry or sore from the anesthesia or the breathing tube placed in your throat during surgery. If this causes discomfort, gargle with warm salt water. The discomfort should disappear within 24 hours.  May have Tylenol if needed after 4:15pm.

## 2022-08-20 NOTE — Progress Notes (Signed)
Assisted Dr. Greg Stoltzfus with right, pectoralis, ultrasound guided block. Side rails up, monitors on throughout procedure. See vital signs in flow sheet. Tolerated Procedure well. 

## 2022-08-20 NOTE — Progress Notes (Signed)
Nuc med inj performed by nuc med staff. Pt tol well. No additional sedation required. Emotional support provided

## 2022-08-20 NOTE — Anesthesia Postprocedure Evaluation (Signed)
Anesthesia Post Note  Patient: [name]  Procedure(s) Performed: RIGHT BREAST LUMPECTOMY WITH RADIOACTIVE SEED AND SENTINEL LYMPH NODE BIOPSY (Right: Breast)     Patient location during evaluation: PACU Anesthesia Type: Regional and General Level of consciousness: awake and alert Pain management: pain level controlled Vital Signs Assessment: post-procedure vital signs reviewed and stable Respiratory status: spontaneous breathing, nonlabored ventilation, respiratory function stable and patient connected to nasal cannula oxygen Cardiovascular status: blood pressure returned to baseline and stable Postop Assessment: no apparent nausea or vomiting Anesthetic complications: no   No notable events documented.  Last Vitals:  Vitals:   08/20/22 1230 08/20/22 1235  BP: (!) 130/93 (!) 128/90  Pulse: 92 96  Resp:  16  Temp: 37.2 C 37.2 C  SpO2: 97% 97%    Last Pain:  Vitals:   08/20/22 1235  TempSrc:   PainSc: 0-No pain                 Earl Lites P Ryosuke Ericksen

## 2022-08-20 NOTE — Interval H&P Note (Signed)
History and Physical Interval Note:  08/20/2022 10:02 AM  [name]  has presented today for surgery, with the diagnosis of RIGHT BREAST CANCER.  The various methods of treatment have been discussed with the patient and family. After consideration of risks, benefits and other options for treatment, the patient has consented to  Procedure(s) with comments: RIGHT BREAST LUMPECTOMY WITH RADIOACTIVE SEED AND SENTINEL LYMPH NODE BIOPSY (Right) - PEC BLOCK as a surgical intervention.  The patient's history has been reviewed, patient examined, no change in status, stable for surgery.  I have reviewed the patient's chart and labs.  Questions were answered to the patient's satisfaction.     [name]

## 2022-08-20 NOTE — Op Note (Signed)
08/20/2022  11:43 AM  PATIENT:  [name]  47 y.o. female  PRE-OPERATIVE DIAGNOSIS:  RIGHT BREAST CANCER  POST-OPERATIVE DIAGNOSIS:  RIGHT BREAST CANCER  PROCEDURE:  Procedure(s) with comments: RIGHT BREAST LUMPECTOMY WITH RADIOACTIVE SEED LOCALIZATION AND DEEP RIGHT AXILLARY SENTINEL LYMPH NODE BIOPSY (Right) - PEC BLOCK  SURGEON:  Surgeon(s) and Role:    * Griselda Miner, MD - Primary  PHYSICIAN ASSISTANT:   ASSISTANTS: none   ANESTHESIA:   local and general  EBL:  15 mL   BLOOD ADMINISTERED:none  DRAINS: none   LOCAL MEDICATIONS USED:  MARCAINE     SPECIMEN:  Source of Specimen:  right breast tissue with additional medial, lateral, and inferior margin and sentinel nodes x 2  DISPOSITION OF SPECIMEN:  PATHOLOGY  COUNTS:  YES  TOURNIQUET:  * No tourniquets in log *  DICTATION: .Dragon Dictation  After informed consent was obtained the patient was brought to the operating room and placed in the supine position on the operating table.  After adequate induction of general anesthesia the patient's right chest, breast, and axillary area were prepped with ChloraPrep, allowed to dry, and draped in usual sterile manner.  An appropriate timeout was performed.  Previously an I-125 seed was placed in the lower outer quadrant of the right breast to mark an area of invasive breast cancer.  Earlier in the day the patient also underwent injection of 1 mCi of technetium sulfur colloid in the subareolar position on the right.  The neoprobe was set to technetium and a radioactive signal was identified in the right axilla.  The area overlying this was infiltrated with quarter percent Marcaine.  A small transversely oriented incision was made with a 15 blade knife overlying the area of radioactivity.  The incision was carried through the skin and subcutaneous tissue sharply with the electrocautery until the deep right axillary space was entered.  The neoprobe was used to direct blunt hemostat  dissection.  I was able to identify 2 lymph nodes with signal.  Each of these lymph nodes was excised sharply with the electrocautery and the surrounding small vessels and lymphatics were controlled with clips.  Ex vivo counts on these nodes ranged from [phone].  No other hot or palpable nodes were identified in the right axilla.  Hemostasis was achieved using the Bovie electrocautery.  The deep layer of the incision was closed with interrupted 3-0 Vicryl stitches.  The skin was then closed with a running 4-0 Monocryl subcuticular stitch.  Attention was then turned to the right breast.  The neoprobe was set to I-125 in the area of radioactivity was readily identified.  The patient still had an ulcer in the right lower outer quadrant from the biopsy.  The radioactive seed was very close to the ulcerated area.  I elected to make an elliptical incision in the skin overlying the ulcer with a 15 blade knife.  The incision was carried through the skin and subcutaneous tissue sharply with the electrocautery.  Dissection was then carried out with around the radioactive seed while checking the area of radioactivity frequently.  I took this dissection very close to the capsule of the implant but did not violate it.  Once the tissue was removed it was oriented with the appropriate paint colors.  A specimen radiograph was obtained that showed the clip and seed to be within the specimen.  I elected to take an additional inferior, medial, and lateral margin.  These were marked appropriately and all of  the tissue was sent to pathology for further evaluation.  Hemostasis was achieved using the Bovie electrocautery.  The edges of the lumpectomy cavity were then infiltrated with quarter percent Marcaine.  The deep layer of the incision was then closed with interrupted 3-0 Vicryl stitches.  The skin was then closed with a running 4-0 Monocryl subcuticular stitch.  Dermabond dressings were applied.  The patient tolerated the procedure  well.  At the end of the case all needle sponge and instrument counts were correct.  The patient was then awakened and taken to recovery in stable condition.  PLAN OF CARE: Discharge to home after PACU  PATIENT DISPOSITION:  PACU - hemodynamically stable.   Delay start of Pharmacological VTE agent (>24hrs) due to surgical blood loss or risk of bleeding: not applicable

## 2022-08-20 NOTE — Anesthesia Procedure Notes (Signed)
Anesthesia Regional Block: Pectoralis block   Pre-Anesthetic Checklist: , timeout performed,  Correct Patient, Correct Site, Correct Laterality,  Correct Procedure, Correct Position, site marked,  Risks and benefits discussed,  Surgical consent,  Pre-op evaluation,  At surgeon's request and post-op pain management  Laterality: Right  Prep: Dura Prep       Needles:  Injection technique: Single-shot  Needle Type: Echogenic Stimulator Needle     Needle Length: 10cm  Needle Gauge: 20     Additional Needles:   Procedures:,,,, ultrasound used (permanent image in chart),,    Narrative:  Start time: 08/20/2022 9:25 AM End time: 08/20/2022 9:30 AM Injection made incrementally with aspirations every 5 mL.  Performed by: Personally  Anesthesiologist: Atilano Median, DO  Additional Notes: Patient identified. Risks/Benefits/Options discussed with patient including but not limited to bleeding, infection, nerve damage, failed block, incomplete pain control. Patient expressed understanding and wished to proceed. All questions were answered. Sterile technique was used throughout the entire procedure. Please see nursing notes for vital signs. Aspirated in 5cc intervals with injection for negative confirmation. Patient was given instructions on fall risk and not to get out of bed. All questions and concerns addressed with instructions to call with any issues or inadequate analgesia.

## 2022-08-20 NOTE — Telephone Encounter (Signed)
I attempted to contact [name] to discuss her genetic testing results (48 genes). Her voicemail box is full.  Lalla Brothers, MS, [facility] Genetic Counselor [email or web address] (P) [phone]

## 2022-08-20 NOTE — Anesthesia Procedure Notes (Signed)
Procedure Name: LMA Insertion Date/Time: 08/20/2022 10:32 AM  Performed by: Pearson Grippe, CRNAPre-anesthesia Checklist: Patient identified, Emergency Drugs available, Suction available and Patient being monitored Patient Re-evaluated:Patient Re-evaluated prior to induction Oxygen Delivery Method: Circle system utilized Preoxygenation: Pre-oxygenation with 100% oxygen Induction Type: IV induction Ventilation: Mask ventilation without difficulty LMA: LMA inserted LMA Size: 4.0 Number of attempts: 1 Airway Equipment and Method: Bite block Placement Confirmation: positive ETCO2 Tube secured with: Tape Dental Injury: Teeth and Oropharynx as per pre-operative assessment

## 2022-08-20 NOTE — H&P (Signed)
REFERRING PHYSICIAN: Lucia Bitter* PROVIDER: Lindell Noe, MD MRN: [id number] DOB: July 24, 1975 Subjective   Chief Complaint: No chief complaint on file.  History of Present Illness: [name] is a 47 y.o. female who is seen today as an office consultation for evaluation of No chief complaint on file.  We are asked to see the patient in consultation by Dr. Al Pimple to evaluate her for a new right breast cancer. The patient is a 47 year old white female who recently went for a routine screening mammogram. At that time she was found to have a 4 mm mass in the lower aspect of the right breast. The lymph nodes looked normal. The mass was biopsied and came back as a grade 2 invasive ductal cancer that was ER and PR positive and HER2 negative with a Ki-67 of 3%. She is otherwise in good health and does not smoke. She does have retropectoral implants placed by Dr. Odis Luster 12 years ago  Review of Systems: A complete review of systems was obtained from the patient. I have reviewed this information and discussed as appropriate with the patient. See HPI as well for other ROS.  ROS   Medical History: Past Medical History:  Diagnosis Date  Anxiety  GERD (gastroesophageal reflux disease)  History of cancer   Patient Active Problem List  Diagnosis  Malignant neoplasm of lower-outer quadrant of right breast of female, estrogen receptor positive (CMS/HHS-HCC)   Past Surgical History:  Procedure Laterality Date  Lipoma Excision N/A 06/17/2007  .Right Breast Biopsy Right 07/31/2022  Breast Enhancement Surgery N/A  2012  CHOLECYSTECTOMY    Not on File  Current Outpatient Medications on File Prior to Visit  Medication Sig Dispense Refill  escitalopram oxalate (LEXAPRO) 20 MG tablet Take 1 tablet by mouth once daily  omeprazole (PRILOSEC) 20 MG DR capsule Take 20 mg by mouth once daily  propranoloL (INDERAL) 20 MG tablet Take 20 mg by mouth 2 (two) times daily  cholecalciferol  (VITAMIN D3) 5,000 unit capsule Take 5,000 Units by mouth once daily  hydroCHLOROthiazide (HYDRODIURIL) 12.5 MG tablet Take 12.5 mg by mouth once daily  ibuprofen (MOTRIN) 800 MG tablet Take 800 mg by mouth every 8 (eight) hours as needed  LORazepam (ATIVAN) 1 MG tablet Take 1 mg by mouth as directed for Anxiety   No current facility-administered medications on file prior to visit.   Family History  Problem Relation Age of Onset  Coronary Artery Disease (Blocked arteries around heart) Father  Colon cancer Father    Social History   Tobacco Use  Smoking Status Former  Types: Cigarettes  Smokeless Tobacco Never    Social History   Socioeconomic History  Marital status: Married  Tobacco Use  Smoking status: Former  Types: Cigarettes  Smokeless tobacco: Never  Vaping Use  Vaping status: Never Used  Substance and Sexual Activity  Alcohol use: Yes  Comment: "occasionally"  Drug use: Never   Objective:  There were no vitals filed for this visit.  There is no height or weight on file to calculate BMI.  Physical Exam Vitals reviewed.  Constitutional:  General: She is not in acute distress. Appearance: Normal appearance.  HENT:  Head: Normocephalic and atraumatic.  Right Ear: External ear normal.  Left Ear: External ear normal.  Nose: Nose normal.  Mouth/Throat:  Mouth: Mucous membranes are moist.  Pharynx: Oropharynx is clear.  Eyes:  General: No scleral icterus. Extraocular Movements: Extraocular movements intact.  Conjunctiva/sclera: Conjunctivae normal.  Pupils: Pupils are  equal, round, and reactive to light.  Cardiovascular:  Rate and Rhythm: Normal rate and regular rhythm.  Pulses: Normal pulses.  Heart sounds: Normal heart sounds.  Pulmonary:  Effort: Pulmonary effort is normal. No respiratory distress.  Breath sounds: Normal breath sounds.  Abdominal:  General: Bowel sounds are normal.  Palpations: Abdomen is soft.  Tenderness: There is no abdominal  tenderness.  Musculoskeletal:  General: No swelling, tenderness or deformity. Normal range of motion.  Cervical back: Normal range of motion and neck supple.  Skin: General: Skin is warm and dry.  Coloration: Skin is not jaundiced.  Neurological:  General: No focal deficit present.  Mental Status: She is alert and oriented to person, place, and time.  Psychiatric:  Mood and Affect: Mood normal.  Behavior: Behavior normal.     Breast: There is no palpable mass in either breast. There is no palpable axillary, supraclavicular, or cervical lymphadenopathy. She does have some palpable bruising in the lower aspect of the right breast.  Labs, Imaging and Diagnostic Testing:  Assessment and Plan:   Diagnoses and all orders for this visit:  Malignant neoplasm of lower-outer quadrant of right breast of female, estrogen receptor positive (CMS/HHS-HCC)   The patient appears to have a 4 mm cancer in the lower portion of the right breast with clinically negative nodes and all favorable markers. The cancer seems to be well away from the implant. I have discussed with her in detail the different options for treatment and at this point she favors breast conservation which I feel is very reasonable. She is also a good candidate for sentinel node biopsy. I have discussed with her in detail the risks and benefits of the operation as well as some of the technical aspects including use of a radioactive seed for localization and she understands and wishes to proceed. She will also meet with medical and radiation oncology to discuss adjuvant therapy. We will begin surgical scheduling

## 2022-08-21 ENCOUNTER — Encounter (HOSPITAL_BASED_OUTPATIENT_CLINIC_OR_DEPARTMENT_OTHER): Payer: Self-pay | Admitting: General Surgery

## 2022-08-21 ENCOUNTER — Encounter: Payer: Self-pay | Admitting: *Deleted

## 2022-08-21 DIAGNOSIS — C50511 Malignant neoplasm of lower-outer quadrant of right female breast: Secondary | ICD-10-CM

## 2022-08-21 LAB — SURGICAL PATHOLOGY

## 2022-08-24 ENCOUNTER — Telehealth: Payer: Self-pay | Admitting: Genetic Counselor

## 2022-08-24 NOTE — Telephone Encounter (Signed)
I contacted Ms. Vaden to discuss her genetic testing results. No pathogenic variants were identified in the 48 genes analyzed. Detailed clinic note to follow.  The test report has been scanned into EPIC and is located under the Molecular Pathology section of the Results Review tab.  A portion of the result report is included below for reference.   Lalla Brothers, MS, [facility] Genetic Counselor [email or web address] (P) [phone]

## 2022-08-27 ENCOUNTER — Telehealth: Payer: Self-pay | Admitting: *Deleted

## 2022-08-27 NOTE — Telephone Encounter (Signed)
Received order for oncotype testing. Requisition sent to pathology. 

## 2022-08-29 ENCOUNTER — Ambulatory Visit: Payer: Self-pay | Admitting: Genetic Counselor

## 2022-08-29 ENCOUNTER — Encounter: Payer: Self-pay | Admitting: Genetic Counselor

## 2022-08-29 DIAGNOSIS — Z1379 Encounter for other screening for genetic and chromosomal anomalies: Secondary | ICD-10-CM

## 2022-08-29 NOTE — Progress Notes (Signed)
HPI:   [name] was previously seen in the [facility] due to a personal and family history of cancer and concerns regarding a hereditary predisposition to cancer. Please refer to our prior cancer genetics clinic note for more information regarding our discussion, assessment and recommendations, at the time. [name] recent genetic test results were disclosed to her, as were recommendations warranted by these results. These results and recommendations are discussed in more detail below.  CANCER HISTORY:  Oncology History  Malignant neoplasm of lower-outer quadrant of right breast of female, estrogen receptor positive (HCC)  07/27/2022 Mammogram   Patient had screening mammogram recall, underwent diagnostic mammogram, this showed suspicious 4 mm lower right breast mass. No abnormal appearing right axillary nodes.   07/31/2022 Pathology Results   Right breast needle core biopsy showed IDC grade 2, ER 90% pos, strong staining, PR 95% positive strong staining, Her 2 neg.   08/06/2022 Initial Diagnosis   Malignant neoplasm of lower-outer quadrant of right breast of female, estrogen receptor positive (HCC)    Genetic Testing   [facility] Common Cancer Panel+RNA was Negative. Report date is 08/17/2022.  The Common Hereditary Cancers Panel offered by [facility] includes sequencing and/or deletion duplication testing of the following 48 genes: APC, ATM, AXIN2, BAP1, BARD1, BMPR1A, BRCA1, BRCA2, BRIP1, CDH1, CDK4, CDKN2A (p14ARF and p16INK4a only), CHEK2, CTNNA1, DICER1, EPCAM (Deletion/duplication testing only), FH, GREM1 (promoter region duplication testing only), HOXB13, KIT, MBD4, MEN1, MLH1, MSH2, MSH3, MSH6, MUTYH, NF1, NHTL1, PALB2, PDGFRA, PMS2, POLD1, POLE, PTEN, RAD51C, RAD51D, SDHA (sequencing analysis only except exon 14), SDHB, SDHC, SDHD, SMAD4, SMARCA4. STK11, TP53, TSC1, TSC2, and VHL.     FAMILY HISTORY:  We obtained a detailed, 4-generation family history.  Significant  diagnoses are listed below:  [name] father was diagnosed with colon cancer at age 38, he is currently 41.  [name] is unaware of previous family history of genetic testing for hereditary cancer risks. There is no reported Ashkenazi Jewish ancestry.        GENETIC TEST RESULTS:  The [facility] Common Cancer Panel found no pathogenic mutations.   The Common Hereditary Cancers Panel offered by [facility] includes sequencing and/or deletion duplication testing of the following 48 genes: APC, ATM, AXIN2, BAP1, BARD1, BMPR1A, BRCA1, BRCA2, BRIP1, CDH1, CDK4, CDKN2A (p14ARF and p16INK4a only), CHEK2, CTNNA1, DICER1, EPCAM (Deletion/duplication testing only), FH, GREM1 (promoter region duplication testing only), HOXB13, KIT, MBD4, MEN1, MLH1, MSH2, MSH3, MSH6, MUTYH, NF1, NHTL1, PALB2, PDGFRA, PMS2, POLD1, POLE, PTEN, RAD51C, RAD51D, SDHA (sequencing analysis only except exon 14), SDHB, SDHC, SDHD, SMAD4, SMARCA4. STK11, TP53, TSC1, TSC2, and VHL.  The test report has been scanned into EPIC and is located under the Molecular Pathology section of the Results Review tab.  A portion of the result report is included below for reference. Genetic testing reported out on 08/17/2022.      Even though a pathogenic variant was not identified, possible explanations for the cancer in the family may include: There may be no hereditary risk for cancer in the family. The cancers in [name] and/or her family may be due to other genetic or environmental factors. There may be a gene mutation in one of these genes that current testing methods cannot detect, but that chance is small. There could be another gene that has not yet been discovered, or that we have not yet tested, that is responsible for the cancer diagnoses in the family.   Therefore, it is important to remain  in touch with cancer genetics in the future so that we can continue to offer [name] the most up to date genetic testing.   ADDITIONAL GENETIC  TESTING:  We discussed with Ms. Filion that her genetic testing was fairly extensive.  If there are genes identified to increase cancer risk that can be analyzed in the future, we would be happy to discuss and coordinate this testing at that time.    CANCER SCREENING RECOMMENDATIONS:  [name] test result is considered negative (normal).  This means that we have not identified a hereditary cause for her personal and family history of cancer at this time.   An individual's cancer risk and medical management are not determined by genetic test results alone. Overall cancer risk assessment incorporates additional factors, including personal medical history, family history, and any available genetic information that may result in a personalized plan for cancer prevention and surveillance. Therefore, it is recommended she continue to follow the cancer management and screening guidelines provided by her oncology and primary healthcare provider.  Based on the reported personal and family history, specific cancer screenings for [name] and her family include:  Colon Cancer Screening: Due to [name]'s father's history of colon cancer, she is recommended to begin colonoscopies now and repeat them every 5 years. More frequent colonoscopies may be recommended if polyps are identified.  RECOMMENDATIONS FOR FAMILY MEMBERS:   Since she did not inherit a mutation in a cancer predisposition gene included on this panel, her children could not have inherited a mutation from her in one of these genes. Individuals in this family might be at some increased risk of developing cancer, over the general population risk, due to the family history of cancer. We recommend women in this family have a yearly mammogram beginning at age 68, or 34 years younger than the earliest onset of cancer, an annual clinical breast exam, and perform monthly breast self-exams.  FOLLOW-UP:  Cancer genetics is a rapidly advancing field  and it is possible that new genetic tests will be appropriate for her and/or her family members in the future. We encouraged her to remain in contact with cancer genetics on an annual basis so we can update her personal and family histories and let her know of advances in cancer genetics that may benefit this family.   Our contact number was provided. Ms. Carberry questions were answered to her satisfaction, and she knows she is welcome to call us at anytime with additional questions or concerns.   Lalla Brothers, MS, [facility] Genetic Counselor [email or web address] (P) [phone]

## 2022-09-01 ENCOUNTER — Encounter: Payer: Self-pay | Admitting: Hematology and Oncology

## 2022-09-03 DIAGNOSIS — C50911 Malignant neoplasm of unspecified site of right female breast: Secondary | ICD-10-CM | POA: Diagnosis not present

## 2022-09-03 DIAGNOSIS — Z170 Estrogen receptor positive status [ER+]: Secondary | ICD-10-CM | POA: Diagnosis not present

## 2022-09-03 NOTE — Progress Notes (Signed)
Location of Breast Cancer: Malignant neoplasm of lower-outer quadrant of right breast of female, estrogen receptor positive   Histology per Pathology Report:  08-20-22 FINAL MICROSCOPIC DIAGNOSIS:  A. LYMPH NODE, RIGHT AXILLARY #1, SENTINEL, EXCISION: One lymph node negative for metastatic carcinoma (0/1)  B. LYMPH NODE, RIGHT AXILLARY, SENTINEL, EXCISION: One lymph node negative for metastatic carcinoma (0/1)  C. LYMPH NODE, RIGHT AXILLARY #2, SENTINEL, EXCISION: One lymph node negative for metastatic carcinoma (0/1)  D. LYMPH NODE, RIGHT AXILLARY, SENTINEL, EXCISION: One lymph node negative for metastatic carcinoma (0/1)  E. LYMPH NODE, RIGHT AXILLARY, SENTINEL, EXCISION: One lymph node negative for metastatic carcinoma (0/1)  F. 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 BREAST, RIGHT MEDIAL MARGIN, EXCISION: Fibrocystic changes with usual ductal hyperplasia Medial margin negative for carcinoma  H. BREAST, RIGHT LATERAL MARGIN, EXCISION: Benign breast tissue Lateral margin negative for carcinoma  I. BREAST, RIGHT INFERIOR MARGIN, EXCISION: Benign breast tissue Inferior margin negative for carcinoma  ONCOLOGY TABLE: INVASIVE CARCINOMA OF THE BREAST:  Resection Procedure: Right breast lumpectomy with five sentinel lymph nodes and additional margin biopsies Specimen Laterality: Right breast Histologic Type: Ductal Histologic Grade:      Glandular (Acinar)/Tubular Differentiation: 2      Nuclear Pleomorphism: 3      Mitotic Rate: 1      Overall Grade: 2 Tumor Size:  0.7 x 0.6 x 0.5 cm Ductal Carcinoma In Situ: Cribriform with focal necrosis, intermediate nuclear grade Lymphatic and/or Vascular Invasion: Not identified Treatment Effect in the Breast: No known presurgical therapy Margins: All margins negative for invasive carcinoma      Distance from Closest Margin (mm): 7 mm      Specify Closest Margin (required only if <71mm): Superior DCIS Margins: All margins negative for DCIS      Distance from Closest Margin (mm): 7 mm      Specify Closest Margin (required only if <49mm): Superior Regional Lymph Nodes:      Number of Lymph Nodes Examined: 5      Number of Sentinel Nodes Examined: 5      Number of Lymph Nodes with Macrometastases (>2 mm): 0      Number of Lymph Nodes with Micrometastases: 0      Number of Lymph Nodes with Isolated Tumor Cells (=0.2 mm or =200 cells): 0 Distant Metastasis:      Distant Site(s) Involved: Not applicable Breast Biomarker Testing Performed on Previous Biopsy:      Testing Performed on Case Number: [id number]            Estrogen Receptor: 90%, positive, strong staining            Progesterone Receptor: 95%, positive, strong staining            HER2: Equivocal with IHC.  Negative with FISH, ratio 1.74            Ki-67: 3% Pathologic Stage Classification (pTNM, AJCC 8th Edition): pT1b, pN0 Representative Tumor Block: F1 (v4.5.0.0)  Receptor Status: ER(90%), PR (95%), Her2-neu (Negative with FISH), Ki-67(3%)  Did patient present with symptoms (if so, please note symptoms) or was this  found on screening mammography?: screening mammogram  Past/Anticipated interventions by surgeon, if any: 08-20-22 PRE-OPERATIVE DIAGNOSIS:  RIGHT BREAST CANCER   POST-OPERATIVE DIAGNOSIS:  RIGHT BREAST CANCER   PROCEDURE:  Procedure(s) with comments: RIGHT BREAST LUMPECTOMY WITH RADIOACTIVE SEED LOCALIZATION AND DEEP RIGHT AXILLARY SENTINEL LYMPH NODE BIOPSY (Right) - PEC BLOCK   SURGEON:  Surgeon(s) and Role:    Griselda Miner, MD - Primary  Past/Anticipated interventions by medical oncology, if any:  Rachel Moulds, MD 08/08/2022  Oncology History  Malignant neoplasm of lower-outer quadrant of right breast of female, estrogen receptor positive (HCC)  07/27/2022 Mammogram    Patient had screening mammogram recall, underwent diagnostic mammogram, this showed suspicious 4 mm lower right breast mass. No abnormal appearing right axillary nodes.    07/31/2022 Pathology Results    Right breast needle core biopsy showed IDC grade 2, ER 90% pos, strong staining, PR 95% positive strong staining, Her 2 neg.    08/06/2022 Initial Diagnosis    Malignant neoplasm of lower-outer quadrant of right breast of female, estrogen receptor positive (HCC)  ASSESSMENT AND PLAN:  Malignant neoplasm of lower-outer quadrant of right breast of female, estrogen receptor positive (HCC) This is a pleasant 47 year old premenopausal female patient with newly diagnosed right breast grade 2 IDC, ER/PR positive HER2 negative, low proliferation index, grade 2 DCIS referred to breast MDC for additional recommendations.  Given the tumor size under 5 mm and strong ER/PR positivity, she will proceed with upfront surgery.  If the final pathology shows tumor measuring over 5 mm, we can certainly consider Oncotype DX testing and I discussed the following details about Oncotype DX testing today.   We have discussed about Oncotype Dx score which is a well validated prognostic scoring system which can predict outcome with endocrine therapy alone and whether chemotherapy reduces recurrence.  Typically in patients with ER positive cancers that are node negative if the RS score is high typically greater than or equal to 26, chemotherapy is recommended.  In women with intermediate recurrence score younger than 50, there can still be some role for chemotherapy in addition to endocrine therapy especially if the recurrence score is between 21-25. If chemotherapy is  needed, this will precede radiation and then after radiation she will continue on antiestrogen therapy.   If she does not qualify for Oncotype DX testing then she will proceed with adjuvant radiation followed by antiestrogen therapy.  Given her premenopausal status we focused our discussion mostly on tamoxifen today.  Have discussed mechanism of action of tamoxifen, adverse effects of tamoxifen including but not limited to postmenopausal symptoms such as hot flashes, vaginal discharge, mood swings, increased risk of DVT/PE and rarely endometrial hyperplasia and endometrial carcinoma.  The benefit of tamoxifen would be improvement in bone density.  She will start antiestrogen therapy after adjuvant radiation.  She will return to clinic after radiation to initiate antiestrogen therapy.   She is agreeable to all these recommendations.  Will plan to see her after surgery to review any additional recommendations.  Thank you for consulting [place] in the care of this patient.  Please do not hesitate to contact us with any additional questions or concerns.  Lymphedema issues, if any:  none to report  Pain issues, if any:  none to report  SAFETY ISSUES: Prior radiation? no Pacemaker/ICD? no Possible current pregnancy?no, husband had vasectomy Is the patient on methotrexate? no  Current Complaints / other details: She is healing well from surgical incisions. She has a  45 year old and a 45 year old. She has no major questions or concerns at this time.

## 2022-09-05 ENCOUNTER — Encounter (HOSPITAL_COMMUNITY): Payer: Self-pay

## 2022-09-06 ENCOUNTER — Telehealth: Payer: Self-pay | Admitting: *Deleted

## 2022-09-06 ENCOUNTER — Encounter: Payer: Self-pay | Admitting: *Deleted

## 2022-09-06 NOTE — Telephone Encounter (Signed)
Received oncotype results of 15/4%. Appt with Dr, Roselind Messier 7/29. Left message for a return phone call to give oncotype results

## 2022-09-10 ENCOUNTER — Telehealth: Payer: Self-pay | Admitting: *Deleted

## 2022-09-10 ENCOUNTER — Encounter: Payer: Self-pay | Admitting: *Deleted

## 2022-09-10 NOTE — Telephone Encounter (Signed)
Patient returned phone call. Informed her of oncotype results and that chemo will not be needed.  Per Dr. Al Pimple she will see her at the end of xrt. Patient verbalized understanding.

## 2022-09-14 ENCOUNTER — Telehealth: Payer: Self-pay

## 2022-09-14 ENCOUNTER — Encounter: Payer: Self-pay | Admitting: Radiation Oncology

## 2022-09-14 NOTE — Telephone Encounter (Signed)
Rn unable to reach pt to obtain pre consult information. Rn left message with call back detail for pt. Rn will attempt to call back later.

## 2022-09-14 NOTE — Telephone Encounter (Signed)
Rn called pt for meaningful use and nurse evaluation information. Note completed and routed to Dr. Roselind Messier for review.

## 2022-09-16 NOTE — Progress Notes (Signed)
Radiation Oncology         [phone]) [phone] ________________________________  Name: [name] MRN: [id number]  Date: 09/17/2022  DOB: 08-04-1975  Re-Evaluation Note  CC: Hamrick, Durward Fortes, MD  Rachel Moulds, MD  No diagnosis found.  Diagnosis:  Stage IA (cT1a, cN0, cM0) Right Breast LOQ, Invasive and in situ ductal carcinoma, ER+ / PR+ / Her2-, Grade 2: s/p lumpectomy and right axillary SLN biopsies   Narrative:  The patient returns today to discuss radiation treatment options. She was seen in the multidisciplinary breast clinic on 08/08/22.   She underwent genetic testing on her consultation date. Results showed no clinically significant variants detected by [facility] genetic testing.   She opted to proceed with a right breast lumpectomy with right axillary SLN biopsies on 08/20/22 under the care of Dr. Carolynne Edouard. Pathology from the procedure revealed: tumor the size of 7 mm; histology of grade 2 invasive ductal carcinoma with intermediate grade DCIS and focal necrosis; all margins negative for invasive and in situ carcinoma; nodal status of 5/5 rigth axillary sentinel lymph node excisions negative for carcinoma. Prognostic indicators significant for: estrogen receptor 95% positive and progesterone receptor 95% positive, both with strong staining intensity; Proliferation marker Ki67 at 3%; Her2 status negative; Grade 2.    Oncotype DX was obtained on the final surgical sample and the recurrence score of 15 predicts a risk of recurrence outside the breast over the next 9 years of 4%, if the patient's only systemic therapy were to be an antiestrogen for 5 years.  It also predicts no significant benefit from chemotherapy.  Based on her premenopausal status, Oncotype results, and her discussion with Dr. Al Pimple on her breast clinic consultation date, the patient will proceed with antiestrogen therapy consisting of tamoxifen after she completes XRT.  On review of systems, the patient reports ***. She  denies *** and any other symptoms.    Allergies:  is allergic to amoxicillin, bactrim, erythromycin, and sulfa drugs cross reactors.  Meds: Current Outpatient Medications  Medication Sig Dispense Refill   Cholecalciferol (VITAMIN D) 125 MCG (5000 UT) CAPS Take by mouth.     escitalopram (LEXAPRO) 20 MG tablet Take 1 tablet by mouth daily.     hydrochlorothiazide (HYDRODIURIL) 12.5 MG tablet Take 12.5 mg by mouth daily.     ibuprofen (ADVIL) 800 MG tablet Take 800 mg by mouth every 8 (eight) hours as needed.     LORazepam (ATIVAN) 1 MG tablet Take 1 mg by mouth as directed.     omeprazole (PRILOSEC) 20 MG capsule Take 20 mg by mouth daily.     oxyCODONE (ROXICODONE) 5 MG immediate release tablet Take 1 tablet (5 mg total) by mouth every 6 (six) hours as needed for severe pain. 10 tablet 0   propranolol (INDERAL) 20 MG tablet Take 20 mg by mouth 2 (two) times daily.     No current facility-administered medications for this encounter.    Physical Findings: The patient is in no acute distress. Patient is alert and oriented.  vitals were not taken for this visit.  No significant changes. Lungs are clear to auscultation bilaterally. Heart has regular rate and rhythm. No palpable cervical, supraclavicular, or axillary adenopathy. Abdomen soft, non-tender, normal bowel sounds. Left Breast: no palpable mass, nipple discharge or bleeding. Right Breast: ***  Lab Findings: Lab Results  Component Value Date   WBC 8.1 08/08/2022   HGB 15.9 (H) 08/08/2022   HCT 44.3 08/08/2022   MCV 90.4 08/08/2022   PLT  391 08/08/2022    Radiographic Findings: MM Breast Surgical Specimen  Result Date: 08/20/2022 CLINICAL DATA:  Status post lumpectomy today after earlier radioactive seed localization. EXAM: SPECIMEN RADIOGRAPH OF THE RIGHT BREAST COMPARISON:  Previous exam(s). FINDINGS: Status post excision of the right breast. The radioactive seed and biopsy marker clip are present and appear completely  intact within the specimen. Findings discussed with the OR staff during the procedure. IMPRESSION: Specimen radiograph of the right breast. Electronically Signed   By: Bary Richard M.D.   On: 08/20/2022 11:39  NM Sentinel Node Inj-No Rpt (Breast)  Result Date: 08/20/2022 Sulfur Colloid was injected by the Nuclear Medicine Technologist for sentinel lymph node localization.    Impression:  Stage IA (cT1a, cN0, cM0) Right Breast LOQ, Invasive and in situ ductal carcinoma, ER+ / PR+ / Her2-, Grade 2: s/p lumpectomy and right axillary SLN biopsies   ***  Plan:  Patient is scheduled for CT simulation {date/later today}. ***  -----------------------------------  Billie Lade, PhD, MD  This document serves as a record of services personally performed by Antony Blackbird, MD. It was created on his behalf by Neena Rhymes, a trained medical scribe. The creation of this record is based on the scribe's personal observations and the provider's statements to them. This document has been checked and approved by the attending provider.

## 2022-09-17 ENCOUNTER — Ambulatory Visit
Admission: RE | Admit: 2022-09-17 | Discharge: 2022-09-17 | Disposition: A | Discharge status: Home / Self Care | Payer: 59 | Source: Ambulatory Visit | Attending: Radiation Oncology | Admitting: Radiation Oncology

## 2022-09-17 ENCOUNTER — Other Ambulatory Visit: Payer: Self-pay

## 2022-09-17 VITALS — BP 122/95 | HR 77 | Temp 97.6°F | Resp 18 | Ht 66.0 in | Wt 166.2 lb

## 2022-09-17 DIAGNOSIS — Z79899 Other long term (current) drug therapy: Secondary | ICD-10-CM | POA: Insufficient documentation

## 2022-09-17 DIAGNOSIS — C50511 Malignant neoplasm of lower-outer quadrant of right female breast: Secondary | ICD-10-CM

## 2022-09-17 DIAGNOSIS — Z170 Estrogen receptor positive status [ER+]: Secondary | ICD-10-CM | POA: Insufficient documentation

## 2022-09-24 ENCOUNTER — Encounter: Payer: Self-pay | Admitting: *Deleted

## 2022-09-24 DIAGNOSIS — C50511 Malignant neoplasm of lower-outer quadrant of right female breast: Secondary | ICD-10-CM

## 2022-09-24 DIAGNOSIS — Z170 Estrogen receptor positive status [ER+]: Secondary | ICD-10-CM | POA: Insufficient documentation

## 2022-09-27 ENCOUNTER — Telehealth: Payer: Self-pay | Admitting: Hematology and Oncology

## 2022-09-27 ENCOUNTER — Ambulatory Visit: Payer: 59 | Admitting: Radiation Oncology

## 2022-09-27 NOTE — Telephone Encounter (Signed)
Patient is aware of scheduled follow up appointment times/dates

## 2022-09-28 ENCOUNTER — Ambulatory Visit: Payer: 59

## 2022-09-29 NOTE — Therapy (Unsigned)
OUTPATIENT PHYSICAL THERAPY BREAST CANCER POST OP FOLLOW UP   Patient Name: [name] MRN: [id number] DOB:[birth year], 47 y.o., female Today's Date: 10/01/2022  END OF SESSION:  PT End of Session - 10/01/22 1006     Visit Number 2    Number of Visits 10    Date for PT Re-Evaluation 10/29/22    PT Start Time 1004    Activity Tolerance Patient tolerated treatment well    Behavior During Therapy WFL for tasks assessed/performed             Past Medical History:  Diagnosis Date   Abnormal Pap smear    Anxiety    Breast cancer (HCC)    Breast mass    left   Depression    GERD (gastroesophageal reflux disease)    Headache(784.0)    migraines   History of miscarriage    Hypertension    Incomplete RBBB    Palpitations    Syncope and collapse    Tachycardia    Vaginal Pap smear, abnormal    Past Surgical History:  Procedure Laterality Date   BREAST BIOPSY Right 07/31/2022   [place] RT BREAST BX W LOC DEV 1ST LESION IMG BX SPEC US GUIDE 07/31/2022 [facility]   BREAST BIOPSY  08/16/2022   MM RT RADIOACTIVE SEED LOC MAMMO GUIDE 08/16/2022 [facility]   BREAST ENHANCEMENT SURGERY  2012   BREAST LUMPECTOMY WITH RADIOACTIVE SEED AND SENTINEL LYMPH NODE BIOPSY Right 08/20/2022   Procedure: RIGHT BREAST LUMPECTOMY WITH RADIOACTIVE SEED AND SENTINEL LYMPH NODE BIOPSY;  Surgeon: Griselda Miner, MD;  Location: [facility];  Service: General;  Laterality: Right;  PEC BLOCK   CHOLECYSTECTOMY     DILATION AND CURETTAGE OF UTERUS  2008   LIPOMA EXCISION  2010   left leg   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   US ECHOCARDIOGRAPHY  06/17/2007   EF 55-60%   Patient Active Problem List   Diagnosis Date Noted   Genetic testing 08/16/2022   Family history of colon cancer in father 08/08/2022   Malignant neoplasm of lower-outer quadrant of right breast of female, estrogen receptor positive (HCC) 08/06/2022   Preterm  premature rupture of membranes 06/03/2017   Advanced maternal age in multigravida 12/20/2016   Abnormal chromosomal and genetic finding on antenatal screening of mother 12/20/2016   [redacted] weeks gestation of pregnancy    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 Chevis Pretty  REFERRING DIAG: Right breast cancer  THERAPY DIAG:  Malignant neoplasm of lower-outer quadrant of right breast of female, estrogen receptor positive (HCC)  Abnormal posture  Aftercare following surgery for neoplasm  Rationale for Evaluation and Treatment: Rehabilitation  ONSET DATE: 08/20/2022  SUBJECTIVE:  SUBJECTIVE STATEMENT: Patient reports she underwent a right lumpectomy and sentinel node biopsy (5 negative nodes) on 08/20/2022. Her Oncotype score was low so she will not need chemotherapy. She will proceed to radiation and anti-estrogen therapy.  Today is her first day of radiation.  PERTINENT HISTORY:  Patient was diagnosed on 07/19/2022 with right grade 2 invasive ductal carcinoma breast cancer. She underwent a right lumpectomy and sentinel node biopsy (5 negative nodes) on 08/20/2022. It is ER/PR positive and HER2 negative with a Ki67 of 3%. She had breast implants placed in 2012.   PATIENT GOALS:  Reassess how my recovery is going related to arm function, pain, and swelling.  PAIN:  Are you having pain? No - she has some numbness on her right posterior upper arm.  PRECAUTIONS: Recent Surgery, right UE Lymphedema risk  RED FLAGS: None   ACTIVITY LEVEL / LEISURE: She goes to the gym 3-4x/week, does 30 min of cardio and uses weight equipment. She also runs a chicken farm. Since surgery, she has just been  walking.     OBJECTIVE:   PATIENT SURVEYS:  QUICK DASH:  Quick Dash - 10/01/22 0001     Open a tight or new jar No difficulty    Do heavy household chores (wash walls, wash floors) No difficulty    Carry a shopping bag or briefcase No difficulty    Wash your back No difficulty    Use a knife to cut food No difficulty    Recreational activities in which you take some force or impact through your arm, shoulder, or hand (golf, hammering, tennis) No difficulty    During the past week, to what extent has your arm, shoulder or hand problem interfered with your normal social activities with family, friends, neighbors, or groups? Not at all    During the past week, to what extent has your arm, shoulder or hand problem limited your work or other regular daily activities Not at all    Arm, shoulder, or hand pain. None    Tingling (pins and needles) in your arm, shoulder, or hand Mild    Difficulty Sleeping No difficulty    DASH Score 2.27 %              OBSERVATIONS: Significant edema present right breast; see photo. Incisions have healed well with no signs of infection or redness.   POSTURE:  Forward head and rounded shoulders  LYMPHEDEMA ASSESSMENT:   UPPER EXTREMITY AROM/PROM:   A/PROM RIGHT   eval   RIGHT 10/01/2022  Shoulder extension 53 52  Shoulder flexion 151 167  Shoulder abduction 164 172  Shoulder internal rotation 60 73  Shoulder external rotation 80 90                          (Blank rows = not tested)   A/PROM LEFT   eval  Shoulder extension 50  Shoulder flexion 150  Shoulder abduction 174  Shoulder internal rotation 62  Shoulder external rotation 85                          (Blank rows = not tested)   CERVICAL AROM: All within normal limits   UPPER EXTREMITY STRENGTH: WNL   LYMPHEDEMA ASSESSMENTS:    LANDMARK RIGHT   eval RIGHT 10/01/2022  10 cm proximal to olecranon process 28.1 29.4  Olecranon process 24.2 24.3  10 cm proximal to ulnar  styloid  process 22.4 22.3  Just proximal to ulnar styloid process 15.1 15.2  Across hand at thumb web space 16.8 16.9  At base of 2nd digit 5.8 5.6  (Blank rows = not tested)   LANDMARK LEFT   eval LEFT 10/01/2022  10 cm proximal to olecranon process 27.8 28  Olecranon process 23.1 23.2  10 cm proximal to ulnar styloid process 21 21.4  Just proximal to ulnar styloid process 14.7 14.6  Across hand at thumb web space 16.7 16.7  At base of 2nd digit 5.9 5.8  (Blank rows = not tested)    Surgery type/Date: 08/20/2022 right lumpectomy and sentinel node biopsy Number of lymph nodes removed: 5 Current/past treatment (chemo, radiation, hormone therapy): none Other symptoms:  Heaviness/tightness No Pain No Pitting edema No Infections No Decreased scar mobility No Stemmer sign No  PATIENT EDUCATION:  Education details: HEP and lymphedema risk reduction Person educated: Patient Education method: Explanation Education comprehension: verbalized understanding  HOME EXERCISE PROGRAM: Reviewed previously given post op HEP.   ASSESSMENT:  CLINICAL IMPRESSION: Patient is doing very well s/p right lumpectomy and sentinel node biopsy on 08/20/2022. She saw her surgeon last week and was instructed to doff her compression bra as she had no swelling present. Since that time, she has developed significant right breast edema. She was encouraged today to resume wearing her compression bra and begin PT for manual lymph drainage. Her incisions have healed well and she has regained full shoulder ROM. She will benefit from PT to try to reduce breast edema.  Pt will benefit from skilled therapeutic intervention to improve on the following deficits: Decreased knowledge of precautions, impaired UE functional use, pain, decreased ROM, postural dysfunction.   PT treatment/interventions: ADL/Self care home management, Therapeutic exercises, Therapeutic activity, Patient/Family education, Self Care, Manual lymph  drainage, Manual therapy, and Re-evaluation   GOALS: Goals reviewed with patient? Yes  LONG TERM GOALS:  (STG=LTG)  GOALS Name Target Date  Goal status  1 Pt will demonstrate she has regained full shoulder ROM and function post operatively compared to baselines.  Baseline: 10/29/2022 IN PROGRESS  2 Patient will report/demonstrate a >/= 50% reduction in right breast edema 10/29/2022 INITIAL  3 Patient will verbalize good understanding of performing self manual lymph drainage for right breast swelling. 10/29/2022 INITIAL     PLAN:  PT FREQUENCY/DURATION: 2x/week for 4 weeks  PLAN FOR NEXT SESSION: Manual lymph drainage right breast   [facility]  [address]  [place] [address]  [phone]  After Breast Cancer Class It is recommended you attend the ABC class to be educated on lymphedema risk reduction. This class is free of charge and lasts for 1 hour. It is a 1-time class. You will need to download the TEAMS app either on your phone or computer. We will send you a link the night before or the morning of the class. You should be able to click on that link to join the class. This is not a confidential class. You don't have to turn your camera on, but other participants may be able to see your email address.  Scar massage You can begin gentle scar massage to you incision sites. Gently place one hand on the incision and move the skin (without sliding on the skin) in various directions. Do this for a few minutes and then you can gently massage either coconut oil or vitamin E cream into the scars.  Compression garment You should continue wearing your  compression bra until you feel like you no longer have swelling.  Home exercise Program Continue doing the exercises you were given until you feel like you can do them without feeling any tightness at the end.   Walking Program Studies show that 30 minutes of walking per day (fast enough to elevate your  heart rate) can significantly reduce the risk of a cancer recurrence. If you can't walk due to other medical reasons, we encourage you to find another activity you could do (like a stationary bike or water exercise).  Posture After breast cancer surgery, people frequently sit with rounded shoulders posture because it puts their incisions on slack and feels better. If you sit like this and scar tissue forms in that position, you can become very tight and have pain sitting or standing with good posture. Try to be aware of your posture and sit and stand up tall to heal properly.  Follow up PT: It is recommended you return every 3 months for the first 3 years following surgery to be assessed on the SOZO machine for an L-Dex score. This helps prevent clinically significant lymphedema in 95% of patients. These follow up screens are 10 minute appointments that you are not billed for.  ,MARTI COOPER, PT 10/01/2022, 10:49 AM

## 2022-10-01 ENCOUNTER — Ambulatory Visit
Admission: RE | Admit: 2022-10-01 | Discharge: 2022-10-01 | Disposition: A | Discharge status: Home / Self Care | Payer: 59 | Source: Ambulatory Visit | Attending: Radiation Oncology | Admitting: Radiation Oncology

## 2022-10-01 ENCOUNTER — Ambulatory Visit: Payer: 59 | Attending: General Surgery | Admitting: Physical Therapy

## 2022-10-01 ENCOUNTER — Other Ambulatory Visit: Payer: Self-pay

## 2022-10-01 ENCOUNTER — Encounter: Payer: Self-pay | Admitting: Physical Therapy

## 2022-10-01 DIAGNOSIS — Z170 Estrogen receptor positive status [ER+]: Secondary | ICD-10-CM | POA: Insufficient documentation

## 2022-10-01 DIAGNOSIS — Z483 Aftercare following surgery for neoplasm: Secondary | ICD-10-CM | POA: Insufficient documentation

## 2022-10-01 DIAGNOSIS — C50511 Malignant neoplasm of lower-outer quadrant of right female breast: Secondary | ICD-10-CM | POA: Diagnosis not present

## 2022-10-01 DIAGNOSIS — R293 Abnormal posture: Secondary | ICD-10-CM | POA: Diagnosis not present

## 2022-10-01 LAB — RAD ONC ARIA SESSION SUMMARY
Course Elapsed Days: 0
Plan Fractions Treated to Date: 1
Plan Prescribed Dose Per Fraction: 1.8 Gy
Plan Total Fractions Prescribed: 28
Plan Total Prescribed Dose: 50.4 Gy
Reference Point Dosage Given to Date: 1.8 Gy
Reference Point Session Dosage Given: 1.8 Gy
Session Number: 1

## 2022-10-01 NOTE — Patient Instructions (Signed)
 [facility]  [address], [address]  [place] [address]  [phone]  After Breast Cancer Class It is recommended you attend the ABC class to be educated on lymphedema risk reduction. This class is free of charge and lasts for 1 hour. It is a 1-time class. You will need to download the TEAMS app either on your phone or computer. We will send you a link the night before or the morning of the class. You should be able to click on that link to join the class. This is not a confidential class. You don't have to turn your camera on, but other participants may be able to see your email address.  Scar massage You can begin gentle scar massage to you incision sites. Gently place one hand on the incision and move the skin (without sliding on the skin) in various directions. Do this for a few minutes and then you can gently massage either coconut oil or vitamin E cream into the scars.  Compression garment You should continue wearing your compression bra until you feel like you no longer have swelling.  Home exercise Program Continue doing the exercises you were given until you feel like you can do them without feeling any tightness at the end.   Walking Program Studies show that 30 minutes of walking per day (fast enough to elevate your heart rate) can significantly reduce the risk of a cancer recurrence. If you can't walk due to other medical reasons, we encourage you to find another activity you could do (like a stationary bike or water exercise).  Posture After breast cancer surgery, people frequently sit with rounded shoulders posture because it puts their incisions on slack and feels better. If you sit like this and scar tissue forms in that position, you can become very tight and have pain sitting or standing with good posture. Try to be aware of your posture and sit and stand up tall to heal properly.  Follow up PT: It is recommended you return every 3 months for the  first 3 years following surgery to be assessed on the SOZO machine for an L-Dex score. This helps prevent clinically significant lymphedema in 95% of patients. These follow up screens are 10 minute appointments that you are not billed for.

## 2022-10-02 ENCOUNTER — Ambulatory Visit
Admission: RE | Admit: 2022-10-02 | Discharge: 2022-10-02 | Disposition: A | Discharge status: Home / Self Care | Payer: 59 | Source: Ambulatory Visit | Attending: Radiation Oncology | Admitting: Radiation Oncology

## 2022-10-02 ENCOUNTER — Ambulatory Visit: Payer: 59

## 2022-10-02 ENCOUNTER — Other Ambulatory Visit: Payer: Self-pay

## 2022-10-02 DIAGNOSIS — C50511 Malignant neoplasm of lower-outer quadrant of right female breast: Secondary | ICD-10-CM

## 2022-10-02 DIAGNOSIS — Z510 Encounter for antineoplastic radiation therapy: Secondary | ICD-10-CM | POA: Diagnosis not present

## 2022-10-02 DIAGNOSIS — Z170 Estrogen receptor positive status [ER+]: Secondary | ICD-10-CM | POA: Diagnosis not present

## 2022-10-02 LAB — RAD ONC ARIA SESSION SUMMARY
Course Elapsed Days: 1
Plan Fractions Treated to Date: 2
Plan Prescribed Dose Per Fraction: 1.8 Gy
Plan Total Fractions Prescribed: 28
Plan Total Prescribed Dose: 50.4 Gy
Reference Point Dosage Given to Date: 3.6 Gy
Reference Point Session Dosage Given: 1.8 Gy
Session Number: 2

## 2022-10-02 MED ORDER — ALRA NON-METALLIC DEODORANT (RAD-ONC)
1.0000 | Freq: Once | TOPICAL | Status: AC
  Administered 2022-10-02: 1 via TOPICAL

## 2022-10-02 MED ORDER — RADIAPLEXRX EX GEL: Freq: Once | CUTANEOUS | Status: AC

## 2022-10-03 ENCOUNTER — Ambulatory Visit
Admission: RE | Admit: 2022-10-03 | Discharge: 2022-10-03 | Disposition: A | Discharge status: Home / Self Care | Payer: 59 | Source: Ambulatory Visit | Attending: Radiation Oncology | Admitting: Radiation Oncology

## 2022-10-03 ENCOUNTER — Other Ambulatory Visit: Payer: Self-pay

## 2022-10-03 DIAGNOSIS — Z170 Estrogen receptor positive status [ER+]: Secondary | ICD-10-CM | POA: Diagnosis not present

## 2022-10-03 DIAGNOSIS — Z510 Encounter for antineoplastic radiation therapy: Secondary | ICD-10-CM | POA: Diagnosis not present

## 2022-10-03 DIAGNOSIS — C50511 Malignant neoplasm of lower-outer quadrant of right female breast: Secondary | ICD-10-CM | POA: Diagnosis not present

## 2022-10-03 LAB — RAD ONC ARIA SESSION SUMMARY
Course Elapsed Days: 2
Plan Fractions Treated to Date: 3
Plan Prescribed Dose Per Fraction: 1.8 Gy
Plan Total Fractions Prescribed: 28
Plan Total Prescribed Dose: 50.4 Gy
Reference Point Dosage Given to Date: 5.4 Gy
Reference Point Session Dosage Given: 1.8 Gy
Session Number: 3

## 2022-10-04 ENCOUNTER — Other Ambulatory Visit: Payer: Self-pay

## 2022-10-04 ENCOUNTER — Ambulatory Visit
Admission: RE | Admit: 2022-10-04 | Discharge: 2022-10-04 | Disposition: A | Discharge status: Home / Self Care | Payer: 59 | Source: Ambulatory Visit | Attending: Radiation Oncology | Admitting: Radiation Oncology

## 2022-10-04 DIAGNOSIS — Z510 Encounter for antineoplastic radiation therapy: Secondary | ICD-10-CM | POA: Diagnosis not present

## 2022-10-04 DIAGNOSIS — C50511 Malignant neoplasm of lower-outer quadrant of right female breast: Secondary | ICD-10-CM | POA: Diagnosis not present

## 2022-10-04 DIAGNOSIS — Z170 Estrogen receptor positive status [ER+]: Secondary | ICD-10-CM | POA: Diagnosis not present

## 2022-10-04 LAB — RAD ONC ARIA SESSION SUMMARY
Course Elapsed Days: 3
Plan Fractions Treated to Date: 4
Plan Prescribed Dose Per Fraction: 1.8 Gy
Plan Total Fractions Prescribed: 28
Plan Total Prescribed Dose: 50.4 Gy
Reference Point Dosage Given to Date: 7.2 Gy
Reference Point Session Dosage Given: 1.8 Gy
Session Number: 4

## 2022-10-05 ENCOUNTER — Ambulatory Visit: Payer: 59

## 2022-10-05 ENCOUNTER — Ambulatory Visit
Admission: RE | Admit: 2022-10-05 | Discharge: 2022-10-05 | Disposition: A | Discharge status: Home / Self Care | Payer: 59 | Source: Ambulatory Visit | Attending: Radiation Oncology | Admitting: Radiation Oncology

## 2022-10-05 ENCOUNTER — Other Ambulatory Visit: Payer: Self-pay

## 2022-10-05 DIAGNOSIS — C50511 Malignant neoplasm of lower-outer quadrant of right female breast: Secondary | ICD-10-CM

## 2022-10-05 DIAGNOSIS — R293 Abnormal posture: Secondary | ICD-10-CM

## 2022-10-05 DIAGNOSIS — Z170 Estrogen receptor positive status [ER+]: Secondary | ICD-10-CM | POA: Diagnosis not present

## 2022-10-05 DIAGNOSIS — Z510 Encounter for antineoplastic radiation therapy: Secondary | ICD-10-CM | POA: Diagnosis not present

## 2022-10-05 DIAGNOSIS — Z483 Aftercare following surgery for neoplasm: Secondary | ICD-10-CM | POA: Diagnosis not present

## 2022-10-05 LAB — RAD ONC ARIA SESSION SUMMARY
Course Elapsed Days: 4
Plan Fractions Treated to Date: 5
Plan Prescribed Dose Per Fraction: 1.8 Gy
Plan Total Fractions Prescribed: 28
Plan Total Prescribed Dose: 50.4 Gy
Reference Point Dosage Given to Date: 9 Gy
Reference Point Session Dosage Given: 1.8 Gy
Session Number: 5

## 2022-10-05 NOTE — Therapy (Signed)
OUTPATIENT PHYSICAL THERAPY BREAST CANCER POST OP FOLLOW UP TREATMENT   Patient Name: [name] MRN: [id number] DOB:23-Jun-1975, 47 y.o., female Today's Date: 10/05/2022  END OF SESSION:  PT End of Session - 10/05/22 1010     Visit Number 3    Number of Visits 10    Date for PT Re-Evaluation 10/29/22    PT Start Time 1011   pt. late   PT Stop Time 1052    PT Time Calculation (min) 41 min    Activity Tolerance Patient tolerated treatment well    Behavior During Therapy WFL for tasks assessed/performed             Past Medical History:  Diagnosis Date   Abnormal Pap smear    Anxiety    Breast cancer (HCC)    Breast mass    left   Depression    GERD (gastroesophageal reflux disease)    Headache(784.0)    migraines   History of miscarriage    Hypertension    Incomplete RBBB    Palpitations    Syncope and collapse    Tachycardia    Vaginal Pap smear, abnormal    Past Surgical History:  Procedure Laterality Date   BREAST BIOPSY Right 07/31/2022   [place] RT BREAST BX W LOC DEV 1ST LESION IMG BX SPEC US GUIDE 07/31/2022 [facility]   BREAST BIOPSY  08/16/2022   MM RT RADIOACTIVE SEED LOC MAMMO GUIDE 08/16/2022 [facility]   BREAST ENHANCEMENT SURGERY  2012   BREAST LUMPECTOMY WITH RADIOACTIVE SEED AND SENTINEL LYMPH NODE BIOPSY Right 08/20/2022   Procedure: RIGHT BREAST LUMPECTOMY WITH RADIOACTIVE SEED AND SENTINEL LYMPH NODE BIOPSY;  Surgeon: Griselda Miner, MD;  Location: [facility];  Service: General;  Laterality: Right;  PEC BLOCK   CHOLECYSTECTOMY     DILATION AND CURETTAGE OF UTERUS  2008   LIPOMA EXCISION  2010   left leg   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   US ECHOCARDIOGRAPHY  06/17/2007   EF 55-60%   Patient Active Problem List   Diagnosis Date Noted   Genetic testing 08/16/2022   Family history of colon cancer in father 08/08/2022   Malignant neoplasm of lower-outer quadrant of  right breast of female, estrogen receptor positive (HCC) 08/06/2022   Preterm premature rupture of membranes 06/03/2017   Advanced maternal age in multigravida 12/20/2016   Abnormal chromosomal and genetic finding on antenatal screening of mother 12/20/2016   [redacted] weeks gestation of pregnancy    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 Chevis Pretty  REFERRING DIAG: Right breast cancer  THERAPY DIAG:  Malignant neoplasm of lower-outer quadrant of right breast of female, estrogen receptor positive (HCC)  Abnormal posture  Aftercare following surgery for neoplasm  Rationale for Evaluation and Treatment: Rehabilitation  ONSET DATE: 08/20/2022  SUBJECTIVE:  SUBJECTIVE STATEMENT: 10/05/2022 Radiation is going well. I have been wearing the compression but don't see much change yet.  EVAL Patient reports she underwent a right lumpectomy and sentinel node biopsy (5 negative nodes) on 08/20/2022. Her Oncotype score was low so she will not need chemotherapy. She will proceed to radiation and anti-estrogen therapy.  Today is her first day of radiation.  PERTINENT HISTORY:  Patient was diagnosed on 07/19/2022 with right grade 2 invasive ductal carcinoma breast cancer. She underwent a right lumpectomy and sentinel node biopsy (5 negative nodes) on 08/20/2022. It is ER/PR positive and HER2 negative with a Ki67 of 3%. She had breast implants placed in 2012.   PATIENT GOALS:  Reassess how my recovery is going related to arm function, pain, and swelling.  PAIN:  Are you having pain? No - she has some numbness on her right posterior upper arm.  PRECAUTIONS: Recent Surgery, right UE Lymphedema  risk  RED FLAGS: None   ACTIVITY LEVEL / LEISURE: She goes to the gym 3-4x/week, does 30 min of cardio and uses weight equipment. She also runs a chicken farm. Since surgery, she has just been walking.     OBJECTIVE:   PATIENT SURVEYS:  QUICK DASH:     OBSERVATIONS: Significant edema present right breast; see photo. Incisions have healed well with no signs of infection or redness.   POSTURE:  Forward head and rounded shoulders  LYMPHEDEMA ASSESSMENT:   UPPER EXTREMITY AROM/PROM:   A/PROM RIGHT   eval   RIGHT 10/01/2022  Shoulder extension 53 52  Shoulder flexion 151 167  Shoulder abduction 164 172  Shoulder internal rotation 60 73  Shoulder external rotation 80 90                          (Blank rows = not tested)   A/PROM LEFT   eval  Shoulder extension 50  Shoulder flexion 150  Shoulder abduction 174  Shoulder internal rotation 62  Shoulder external rotation 85                          (Blank rows = not tested)   CERVICAL AROM: All within normal limits   UPPER EXTREMITY STRENGTH: WNL   LYMPHEDEMA ASSESSMENTS:    LANDMARK RIGHT   eval RIGHT 10/01/2022  10 cm proximal to olecranon process 28.1 29.4  Olecranon process 24.2 24.3  10 cm proximal to ulnar styloid process 22.4 22.3  Just proximal to ulnar styloid process 15.1 15.2  Across hand at thumb web space 16.8 16.9  At base of 2nd digit 5.8 5.6  (Blank rows = not tested)   LANDMARK LEFT   eval LEFT 10/01/2022  10 cm proximal to olecranon process 27.8 28  Olecranon process 23.1 23.2  10 cm proximal to ulnar styloid process 21 21.4  Just proximal to ulnar styloid process 14.7 14.6  Across hand at thumb web space 16.7 16.7  At base of 2nd digit 5.9 5.8  (Blank rows = not tested)    Surgery type/Date: 08/20/2022 right lumpectomy and sentinel node biopsy Number of lymph nodes removed: 5 Current/past treatment (chemo, radiation, hormone therapy): none Other symptoms:  Heaviness/tightness  No Pain No Pitting edema No Infections No Decreased scar mobility No Stemmer sign No   TREATMENT 10/05/2022 In supine: Short neck, 5 diaphragmatic breaths, L axillary nodes and establishment of interaxillary pathway, R inguinal nodes and establishment of axilloinguinal pathway, then  R breast moving fluid towards pathways spending extra time in any areas of fibrosis then retracing all steps. Pt was educated in superficial nature of technique importance of stretch and performed all aspects of MLD to the right breast after therapist performed all. She was given illustrated and written instructions.     PATIENT EDUCATION:  Education details: HEP and lymphedema risk reduction Person educated: Patient Education method: Explanation Education comprehension: verbalized understanding  HOME EXERCISE PROGRAM: Reviewed previously given post op HEP.   ASSESSMENT:  CLINICAL IMPRESSION: Pt did very well with her first time performing MLD. She used good stretch and light technique and had a good understanding of sequence, but will benefit from review.  Pt will benefit from skilled therapeutic intervention to improve on the following deficits: Decreased knowledge of precautions, impaired UE functional use, pain, decreased ROM, postural dysfunction.   PT treatment/interventions: ADL/Self care home management, Therapeutic exercises, Therapeutic activity, Patient/Family education, Self Care, Manual lymph drainage, Manual therapy, and Re-evaluation   GOALS: Goals reviewed with patient? Yes  LONG TERM GOALS:  (STG=LTG)  GOALS Name Target Date  Goal status  1 Pt will demonstrate she has regained full shoulder ROM and function post operatively compared to baselines.  Baseline: 10/29/2022 IN PROGRESS  2 Patient will report/demonstrate a >/= 50% reduction in right breast edema 10/29/2022 INITIAL  3 Patient will verbalize good understanding of performing self manual lymph drainage for right breast  swelling. 10/29/2022 INITIAL     PLAN:  PT FREQUENCY/DURATION: 2x/week for 4 weeks  PLAN FOR NEXT SESSION: Manual lymph drainage right breast,    [facility]  [address], [address]  [place] [address]  [phone]  After Breast Cancer Class It is recommended you attend the ABC class to be educated on lymphedema risk reduction. This class is free of charge and lasts for 1 hour. It is a 1-time class. You will need to download the TEAMS app either on your phone or computer. We will send you a link the night before or the morning of the class. You should be able to click on that link to join the class. This is not a confidential class. You don't have to turn your camera on, but other participants may be able to see your email address.  Scar massage You can begin gentle scar massage to you incision sites. Gently place one hand on the incision and move the skin (without sliding on the skin) in various directions. Do this for a few minutes and then you can gently massage either coconut oil or vitamin E cream into the scars.  Compression garment You should continue wearing your compression bra until you feel like you no longer have swelling.  Home exercise Program Continue doing the exercises you were given until you feel like you can do them without feeling any tightness at the end.   Walking Program Studies show that 30 minutes of walking per day (fast enough to elevate your heart rate) can significantly reduce the risk of a cancer recurrence. If you can't walk due to other medical reasons, we encourage you to find another activity you could do (like a stationary bike or water exercise).  Posture After breast cancer surgery, people frequently sit with rounded shoulders posture because it puts their incisions on slack and feels better. If you sit like this and scar tissue forms in that position, you can become very tight and have pain sitting or standing with good  posture. Try to be  aware of your posture and sit and stand up tall to heal properly.  Follow up PT: It is recommended you return every 3 months for the first 3 years following surgery to be assessed on the SOZO machine for an L-Dex score. This helps prevent clinically significant lymphedema in 95% of patients. These follow up screens are 10 minute appointments that you are not billed for.  Waynette Buttery, PT 10/05/2022, 10:54 AM

## 2022-10-08 ENCOUNTER — Other Ambulatory Visit: Payer: Self-pay

## 2022-10-08 ENCOUNTER — Ambulatory Visit
Admission: RE | Admit: 2022-10-08 | Discharge: 2022-10-08 | Disposition: A | Discharge status: Home / Self Care | Payer: 59 | Source: Ambulatory Visit | Attending: Radiation Oncology | Admitting: Radiation Oncology

## 2022-10-08 DIAGNOSIS — Z170 Estrogen receptor positive status [ER+]: Secondary | ICD-10-CM | POA: Diagnosis not present

## 2022-10-08 DIAGNOSIS — C50511 Malignant neoplasm of lower-outer quadrant of right female breast: Secondary | ICD-10-CM | POA: Diagnosis not present

## 2022-10-08 DIAGNOSIS — Z510 Encounter for antineoplastic radiation therapy: Secondary | ICD-10-CM | POA: Diagnosis not present

## 2022-10-08 LAB — RAD ONC ARIA SESSION SUMMARY
Course Elapsed Days: 7
Plan Fractions Treated to Date: 6
Plan Prescribed Dose Per Fraction: 1.8 Gy
Plan Total Fractions Prescribed: 28
Plan Total Prescribed Dose: 50.4 Gy
Reference Point Dosage Given to Date: 10.8 Gy
Reference Point Session Dosage Given: 1.8 Gy
Session Number: 6

## 2022-10-09 ENCOUNTER — Ambulatory Visit
Admission: RE | Admit: 2022-10-09 | Discharge: 2022-10-09 | Disposition: A | Discharge status: Home / Self Care | Payer: 59 | Source: Ambulatory Visit | Attending: Radiation Oncology | Admitting: Radiation Oncology

## 2022-10-09 ENCOUNTER — Ambulatory Visit: Payer: 59 | Admitting: Radiation Oncology

## 2022-10-09 ENCOUNTER — Other Ambulatory Visit: Payer: Self-pay

## 2022-10-09 DIAGNOSIS — C50511 Malignant neoplasm of lower-outer quadrant of right female breast: Secondary | ICD-10-CM | POA: Diagnosis not present

## 2022-10-09 DIAGNOSIS — Z170 Estrogen receptor positive status [ER+]: Secondary | ICD-10-CM | POA: Diagnosis not present

## 2022-10-09 DIAGNOSIS — Z510 Encounter for antineoplastic radiation therapy: Secondary | ICD-10-CM | POA: Diagnosis not present

## 2022-10-09 LAB — RAD ONC ARIA SESSION SUMMARY
Course Elapsed Days: 8
Plan Fractions Treated to Date: 7
Plan Prescribed Dose Per Fraction: 1.8 Gy
Plan Total Fractions Prescribed: 28
Plan Total Prescribed Dose: 50.4 Gy
Reference Point Dosage Given to Date: 12.6 Gy
Reference Point Session Dosage Given: 1.8 Gy
Session Number: 7

## 2022-10-10 ENCOUNTER — Other Ambulatory Visit: Payer: Self-pay

## 2022-10-10 ENCOUNTER — Ambulatory Visit: Payer: 59 | Admitting: Radiation Oncology

## 2022-10-10 ENCOUNTER — Ambulatory Visit
Admission: RE | Admit: 2022-10-10 | Discharge: 2022-10-10 | Disposition: A | Discharge status: Home / Self Care | Payer: 59 | Source: Ambulatory Visit | Attending: Radiation Oncology | Admitting: Radiation Oncology

## 2022-10-10 ENCOUNTER — Ambulatory Visit: Payer: 59

## 2022-10-10 DIAGNOSIS — R293 Abnormal posture: Secondary | ICD-10-CM

## 2022-10-10 DIAGNOSIS — Z510 Encounter for antineoplastic radiation therapy: Secondary | ICD-10-CM | POA: Diagnosis not present

## 2022-10-10 DIAGNOSIS — C50511 Malignant neoplasm of lower-outer quadrant of right female breast: Secondary | ICD-10-CM

## 2022-10-10 DIAGNOSIS — Z170 Estrogen receptor positive status [ER+]: Secondary | ICD-10-CM | POA: Diagnosis not present

## 2022-10-10 DIAGNOSIS — Z483 Aftercare following surgery for neoplasm: Secondary | ICD-10-CM | POA: Diagnosis not present

## 2022-10-10 LAB — RAD ONC ARIA SESSION SUMMARY
Course Elapsed Days: 9
Plan Fractions Treated to Date: 8
Plan Prescribed Dose Per Fraction: 1.8 Gy
Plan Total Fractions Prescribed: 28
Plan Total Prescribed Dose: 50.4 Gy
Reference Point Dosage Given to Date: 14.4 Gy
Reference Point Session Dosage Given: 1.8 Gy
Session Number: 8

## 2022-10-10 NOTE — Therapy (Signed)
OUTPATIENT PHYSICAL THERAPY BREAST CANCER POST OP FOLLOW UP TREATMENT   Patient Name: [name] MRN: [id number] DOB:[birth year], 47 y.o., female Today's Date: 10/10/2022  END OF SESSION:  PT End of Session - 10/10/22 1016     Visit Number 4    Number of Visits 10    Date for PT Re-Evaluation 10/29/22    PT Start Time 1015    PT Stop Time 1053   pt had to leave early for radiation   PT Time Calculation (min) 38 min    Activity Tolerance Patient tolerated treatment well    Behavior During Therapy WFL for tasks assessed/performed             Past Medical History:  Diagnosis Date   Abnormal Pap smear    Anxiety    Breast cancer (HCC)    Breast mass    left   Depression    GERD (gastroesophageal reflux disease)    Headache(784.0)    migraines   History of miscarriage    Hypertension    Incomplete RBBB    Palpitations    Syncope and collapse    Tachycardia    Vaginal Pap smear, abnormal    Past Surgical History:  Procedure Laterality Date   BREAST BIOPSY Right 07/31/2022   [place] RT BREAST BX W LOC DEV 1ST LESION IMG BX SPEC US GUIDE 07/31/2022 [facility]   BREAST BIOPSY  08/16/2022   MM RT RADIOACTIVE SEED LOC MAMMO GUIDE 08/16/2022 [facility]   BREAST ENHANCEMENT SURGERY  2012   BREAST LUMPECTOMY WITH RADIOACTIVE SEED AND SENTINEL LYMPH NODE BIOPSY Right 08/20/2022   Procedure: RIGHT BREAST LUMPECTOMY WITH RADIOACTIVE SEED AND SENTINEL LYMPH NODE BIOPSY;  Surgeon: Griselda Miner, MD;  Location: [facility];  Service: General;  Laterality: Right;  PEC BLOCK   CHOLECYSTECTOMY     DILATION AND CURETTAGE OF UTERUS  2008   LIPOMA EXCISION  2010   left leg   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   US ECHOCARDIOGRAPHY  06/17/2007   EF 55-60%   Patient Active Problem List   Diagnosis Date Noted   Genetic testing 08/16/2022   Family history of colon cancer in father 08/08/2022   Malignant neoplasm  of lower-outer quadrant of right breast of female, estrogen receptor positive (HCC) 08/06/2022   Preterm premature rupture of membranes 06/03/2017   Advanced maternal age in multigravida 12/20/2016   Abnormal chromosomal and genetic finding on antenatal screening of mother 12/20/2016   [redacted] weeks gestation of pregnancy    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 Chevis Pretty  REFERRING DIAG: Right breast cancer  THERAPY DIAG:  Malignant neoplasm of lower-outer quadrant of right breast of female, estrogen receptor positive (HCC)  Abnormal posture  Aftercare following surgery for neoplasm  Rationale for Evaluation and Treatment: Rehabilitation  ONSET DATE: 08/20/2022  SUBJECTIVE:  SUBJECTIVE STATEMENT: I need to leave early for my radiation appt.   EVAL Patient reports she underwent a right lumpectomy and sentinel node biopsy (5 negative nodes) on 08/20/2022. Her Oncotype score was low so she will not need chemotherapy. She will proceed to radiation and anti-estrogen therapy.  Today is her first day of radiation.  PERTINENT HISTORY:  Patient was diagnosed on 07/19/2022 with right grade 2 invasive ductal carcinoma breast cancer. She underwent a right lumpectomy and sentinel node biopsy (5 negative nodes) on 08/20/2022. It is ER/PR positive and HER2 negative with a Ki67 of 3%. She had breast implants placed in 2012.   PATIENT GOALS:  Reassess how my recovery is going related to arm function, pain, and swelling.  PAIN:  Are you having pain? No - she has some numbness on her right posterior upper arm.  PRECAUTIONS: Recent Surgery, right UE Lymphedema risk  RED FLAGS: None   ACTIVITY  LEVEL / LEISURE: She goes to the gym 3-4x/week, does 30 min of cardio and uses weight equipment. She also runs a chicken farm. Since surgery, she has just been walking.     OBJECTIVE:   PATIENT SURVEYS:  QUICK DASH:     OBSERVATIONS: Significant edema present right breast; see photo. Incisions have healed well with no signs of infection or redness.   POSTURE:  Forward head and rounded shoulders  LYMPHEDEMA ASSESSMENT:   UPPER EXTREMITY AROM/PROM:   A/PROM RIGHT   eval   RIGHT 10/01/2022  Shoulder extension 53 52  Shoulder flexion 151 167  Shoulder abduction 164 172  Shoulder internal rotation 60 73  Shoulder external rotation 80 90                          (Blank rows = not tested)   A/PROM LEFT   eval  Shoulder extension 50  Shoulder flexion 150  Shoulder abduction 174  Shoulder internal rotation 62  Shoulder external rotation 85                          (Blank rows = not tested)   CERVICAL AROM: All within normal limits   UPPER EXTREMITY STRENGTH: WNL   LYMPHEDEMA ASSESSMENTS:    LANDMARK RIGHT   eval RIGHT 10/01/2022  10 cm proximal to olecranon process 28.1 29.4  Olecranon process 24.2 24.3  10 cm proximal to ulnar styloid process 22.4 22.3  Just proximal to ulnar styloid process 15.1 15.2  Across hand at thumb web space 16.8 16.9  At base of 2nd digit 5.8 5.6  (Blank rows = not tested)   LANDMARK LEFT   eval LEFT 10/01/2022  10 cm proximal to olecranon process 27.8 28  Olecranon process 23.1 23.2  10 cm proximal to ulnar styloid process 21 21.4  Just proximal to ulnar styloid process 14.7 14.6  Across hand at thumb web space 16.7 16.7  At base of 2nd digit 5.9 5.8  (Blank rows = not tested)    Surgery type/Date: 08/20/2022 right lumpectomy and sentinel node biopsy Number of lymph nodes removed: 5 Current/past treatment (chemo, radiation, hormone therapy): none Other symptoms:  Heaviness/tightness No Pain No Pitting edema No Infections  No Decreased scar mobility No Stemmer sign No   TREATMENT 10/10/22: Manual Therapy In supine: Short neck, superficial abdominals and 5 diaphragmatic breaths, L axillary nodes, Lt intact anterior upper quadrant sequence and establishment of anterior interaxillary pathway, R inguinal nodes and  establishment of Rt axilloinguinal pathway, then R breast moving fluid towards pathways spending extra time in any areas of fibrosis, Lt S/L for further work to lateral and inferior breast and then finished retracing all steps in supine.  10/05/2022 In supine: Short neck, 5 diaphragmatic breaths, L axillary nodes and establishment of interaxillary pathway, R inguinal nodes and establishment of axilloinguinal pathway, then R breast moving fluid towards pathways spending extra time in any areas of fibrosis then retracing all steps. Pt was educated in superficial nature of technique importance of stretch and performed all aspects of MLD to the right breast after therapist performed all. She was given illustrated and written instructions.     PATIENT EDUCATION:  Education details: HEP and lymphedema risk reduction Person educated: Patient Education method: Explanation Education comprehension: verbalized understanding  HOME EXERCISE PROGRAM: Reviewed previously given post op HEP.   ASSESSMENT:  CLINICAL IMPRESSION: Continued with MLD to Rt breast and demonstrated Lt S/L to instruct pt how to better reach the lateral aspect of her breast along with the lateral anastomosis.   Pt will benefit from skilled therapeutic intervention to improve on the following deficits: Decreased knowledge of precautions, impaired UE functional use, pain, decreased ROM, postural dysfunction.   PT treatment/interventions: ADL/Self care home management, Therapeutic exercises, Therapeutic activity, Patient/Family education, Self Care, Manual lymph drainage, Manual therapy, and Re-evaluation   GOALS: Goals reviewed with  patient? Yes  LONG TERM GOALS:  (STG=LTG)  GOALS Name Target Date  Goal status  1 Pt will demonstrate she has regained full shoulder ROM and function post operatively compared to baselines.  Baseline: 10/29/2022 IN PROGRESS  2 Patient will report/demonstrate a >/= 50% reduction in right breast edema 10/29/2022 INITIAL  3 Patient will verbalize good understanding of performing self manual lymph drainage for right breast swelling. 10/29/2022 INITIAL     PLAN:  PT FREQUENCY/DURATION: 2x/week for 4 weeks  PLAN FOR NEXT SESSION: Manual lymph drainage right breast,    [facility]  [address]  [place] [address]  [phone]  After Breast Cancer Class It is recommended you attend the ABC class to be educated on lymphedema risk reduction. This class is free of charge and lasts for 1 hour. It is a 1-time class. You will need to download the TEAMS app either on your phone or computer. We will send you a link the night before or the morning of the class. You should be able to click on that link to join the class. This is not a confidential class. You don't have to turn your camera on, but other participants may be able to see your email address.  Scar massage You can begin gentle scar massage to you incision sites. Gently place one hand on the incision and move the skin (without sliding on the skin) in various directions. Do this for a few minutes and then you can gently massage either coconut oil or vitamin E cream into the scars.  Compression garment You should continue wearing your compression bra until you feel like you no longer have swelling.  Home exercise Program Continue doing the exercises you were given until you feel like you can do them without feeling any tightness at the end.   Walking Program Studies show that 30 minutes of walking per day (fast enough to elevate your heart rate) can significantly reduce the risk of a cancer recurrence. If  you can't walk due to other medical reasons, we encourage you to find  another activity you could do (like a stationary bike or water exercise).  Posture After breast cancer surgery, people frequently sit with rounded shoulders posture because it puts their incisions on slack and feels better. If you sit like this and scar tissue forms in that position, you can become very tight and have pain sitting or standing with good posture. Try to be aware of your posture and sit and stand up tall to heal properly.  Follow up PT: It is recommended you return every 3 months for the first 3 years following surgery to be assessed on the SOZO machine for an L-Dex score. This helps prevent clinically significant lymphedema in 95% of patients. These follow up screens are 10 minute appointments that you are not billed for.  Hermenia Bers, PTA 10/10/2022, 11:04 AM

## 2022-10-11 ENCOUNTER — Ambulatory Visit
Admission: RE | Admit: 2022-10-11 | Discharge: 2022-10-11 | Disposition: A | Discharge status: Home / Self Care | Payer: 59 | Source: Ambulatory Visit | Attending: Radiation Oncology | Admitting: Radiation Oncology

## 2022-10-11 ENCOUNTER — Other Ambulatory Visit: Payer: Self-pay

## 2022-10-11 DIAGNOSIS — Z170 Estrogen receptor positive status [ER+]: Secondary | ICD-10-CM | POA: Diagnosis not present

## 2022-10-11 DIAGNOSIS — Z510 Encounter for antineoplastic radiation therapy: Secondary | ICD-10-CM | POA: Diagnosis not present

## 2022-10-11 DIAGNOSIS — C50511 Malignant neoplasm of lower-outer quadrant of right female breast: Secondary | ICD-10-CM | POA: Diagnosis not present

## 2022-10-11 LAB — RAD ONC ARIA SESSION SUMMARY
Course Elapsed Days: 10
Plan Fractions Treated to Date: 9
Plan Prescribed Dose Per Fraction: 1.8 Gy
Plan Total Fractions Prescribed: 28
Plan Total Prescribed Dose: 50.4 Gy
Reference Point Dosage Given to Date: 16.2 Gy
Reference Point Session Dosage Given: 1.8 Gy
Session Number: 9

## 2022-10-12 ENCOUNTER — Ambulatory Visit
Admission: RE | Admit: 2022-10-12 | Discharge: 2022-10-12 | Disposition: A | Discharge status: Home / Self Care | Payer: 59 | Source: Ambulatory Visit | Attending: Radiation Oncology | Admitting: Radiation Oncology

## 2022-10-12 ENCOUNTER — Ambulatory Visit: Payer: 59

## 2022-10-12 ENCOUNTER — Other Ambulatory Visit: Payer: Self-pay

## 2022-10-12 DIAGNOSIS — C50511 Malignant neoplasm of lower-outer quadrant of right female breast: Secondary | ICD-10-CM | POA: Diagnosis not present

## 2022-10-12 DIAGNOSIS — Z483 Aftercare following surgery for neoplasm: Secondary | ICD-10-CM

## 2022-10-12 DIAGNOSIS — Z170 Estrogen receptor positive status [ER+]: Secondary | ICD-10-CM | POA: Diagnosis not present

## 2022-10-12 DIAGNOSIS — Z510 Encounter for antineoplastic radiation therapy: Secondary | ICD-10-CM | POA: Diagnosis not present

## 2022-10-12 DIAGNOSIS — R293 Abnormal posture: Secondary | ICD-10-CM

## 2022-10-12 LAB — RAD ONC ARIA SESSION SUMMARY
Course Elapsed Days: 11
Plan Fractions Treated to Date: 10
Plan Prescribed Dose Per Fraction: 1.8 Gy
Plan Total Fractions Prescribed: 28
Plan Total Prescribed Dose: 50.4 Gy
Reference Point Dosage Given to Date: 18 Gy
Reference Point Session Dosage Given: 1.8 Gy
Session Number: 10

## 2022-10-12 NOTE — Therapy (Signed)
OUTPATIENT PHYSICAL THERAPY BREAST CANCER TREATMENT   Patient Name: [name] MRN: [id number] DOB:11-30-1975, 47 y.o., female Today's Date: 10/12/2022  END OF SESSION:  PT End of Session - 10/12/22 0906     Visit Number 5    Number of Visits 10    Date for PT Re-Evaluation 10/29/22    PT Start Time 0902    PT Stop Time 1000    PT Time Calculation (min) 58 min    Activity Tolerance Patient tolerated treatment well    Behavior During Therapy WFL for tasks assessed/performed             Past Medical History:  Diagnosis Date   Abnormal Pap smear    Anxiety    Breast cancer (HCC)    Breast mass    left   Depression    GERD (gastroesophageal reflux disease)    Headache(784.0)    migraines   History of miscarriage    Hypertension    Incomplete RBBB    Palpitations    Syncope and collapse    Tachycardia    Vaginal Pap smear, abnormal    Past Surgical History:  Procedure Laterality Date   BREAST BIOPSY Right 07/31/2022   [place] RT BREAST BX W LOC DEV 1ST LESION IMG BX SPEC US GUIDE 07/31/2022 [facility]   BREAST BIOPSY  08/16/2022   MM RT RADIOACTIVE SEED LOC MAMMO GUIDE 08/16/2022 [facility]   BREAST ENHANCEMENT SURGERY  2012   BREAST LUMPECTOMY WITH RADIOACTIVE SEED AND SENTINEL LYMPH NODE BIOPSY Right 08/20/2022   Procedure: RIGHT BREAST LUMPECTOMY WITH RADIOACTIVE SEED AND SENTINEL LYMPH NODE BIOPSY;  Surgeon: Griselda Miner, MD;  Location: [facility];  Service: General;  Laterality: Right;  PEC BLOCK   CHOLECYSTECTOMY     DILATION AND CURETTAGE OF UTERUS  2008   LIPOMA EXCISION  2010   left leg   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   US ECHOCARDIOGRAPHY  06/17/2007   EF 55-60%   Patient Active Problem List   Diagnosis Date Noted   Genetic testing 08/16/2022   Family history of colon cancer in father 08/08/2022   Malignant neoplasm of lower-outer quadrant of right breast of female,  estrogen receptor positive (HCC) 08/06/2022   Preterm premature rupture of membranes 06/03/2017   Advanced maternal age in multigravida 12/20/2016   Abnormal chromosomal and genetic finding on antenatal screening of mother 12/20/2016   [redacted] weeks gestation of pregnancy    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 Chevis Pretty  REFERRING DIAG: Right breast cancer  THERAPY DIAG:  Malignant neoplasm of lower-outer quadrant of right breast of female, estrogen receptor positive (HCC)  Abnormal posture  Aftercare following surgery for neoplasm  Rationale for Evaluation and Treatment: Rehabilitation  ONSET DATE: 08/20/2022  SUBJECTIVE:  SUBJECTIVE STATEMENT: My breast felt a bit better after last session but today I've started noticing a tight band under my breast. I can also tell my skin is starting to get a bit red.   EVAL Patient reports she underwent a right lumpectomy and sentinel node biopsy (5 negative nodes) on 08/20/2022. Her Oncotype score was low so she will not need chemotherapy. She will proceed to radiation and anti-estrogen therapy.  Today is her first day of radiation.  PERTINENT HISTORY:  Patient was diagnosed on 07/19/2022 with right grade 2 invasive ductal carcinoma breast cancer. She underwent a right lumpectomy and sentinel node biopsy (5 negative nodes) on 08/20/2022. It is ER/PR positive and HER2 negative with a Ki67 of 3%. She had breast implants placed in 2012.   PATIENT GOALS:  Reassess how my recovery is going related to arm function, pain, and swelling.  PAIN:  Are you having pain? No - she has some numbness on her right posterior upper arm.  PRECAUTIONS: Recent  Surgery, right UE Lymphedema risk  RED FLAGS: None   ACTIVITY LEVEL / LEISURE: She goes to the gym 3-4x/week, does 30 min of cardio and uses weight equipment. She also runs a chicken farm. Since surgery, she has just been walking.     OBJECTIVE:   PATIENT SURVEYS:  QUICK DASH:     OBSERVATIONS: Significant edema present right breast; see photo. Incisions have healed well with no signs of infection or redness.   POSTURE:  Forward head and rounded shoulders  LYMPHEDEMA ASSESSMENT:   UPPER EXTREMITY AROM/PROM:   A/PROM RIGHT   eval   RIGHT 10/01/2022  Shoulder extension 53 52  Shoulder flexion 151 167  Shoulder abduction 164 172  Shoulder internal rotation 60 73  Shoulder external rotation 80 90                          (Blank rows = not tested)   A/PROM LEFT   eval  Shoulder extension 50  Shoulder flexion 150  Shoulder abduction 174  Shoulder internal rotation 62  Shoulder external rotation 85                          (Blank rows = not tested)   CERVICAL AROM: All within normal limits   UPPER EXTREMITY STRENGTH: WNL   LYMPHEDEMA ASSESSMENTS:    LANDMARK RIGHT   eval RIGHT 10/01/2022  10 cm proximal to olecranon process 28.1 29.4  Olecranon process 24.2 24.3  10 cm proximal to ulnar styloid process 22.4 22.3  Just proximal to ulnar styloid process 15.1 15.2  Across hand at thumb web space 16.8 16.9  At base of 2nd digit 5.8 5.6  (Blank rows = not tested)   LANDMARK LEFT   eval LEFT 10/01/2022  10 cm proximal to olecranon process 27.8 28  Olecranon process 23.1 23.2  10 cm proximal to ulnar styloid process 21 21.4  Just proximal to ulnar styloid process 14.7 14.6  Across hand at thumb web space 16.7 16.7  At base of 2nd digit 5.9 5.8  (Blank rows = not tested)    Surgery type/Date: 08/20/2022 right lumpectomy and sentinel node biopsy Number of lymph nodes removed: 5 Current/past treatment (chemo, radiation, hormone therapy): none Other symptoms:   Heaviness/tightness No Pain No Pitting edema No Infections No Decreased scar mobility No Stemmer sign No   TREATMENT 10/12/22: Manual Therapy MLD: In supine: Short neck,  superficial abdominals and 5 diaphragmatic breaths, L axillary nodes, Lt intact anterior upper quadrant sequence and establishment of anterior interaxillary pathway, R inguinal nodes and establishment of Rt axilloinguinal pathway, then Rt breast moving fluid towards pathways spending extra time in any areas of fibrosis, Lt S/L for further work to lateral and inferior breast spending extra time in areas of fibrosis palpated now at inferior breast and then finished retracing all steps in supine. Reviewed technique with pt. Tactile and VC's to decrease pressure but she was applying a correct skin stretch. P/ROM to Rt shoulder into flexion, abd and D2 with scapular depression throughout STM to Rt lateral trunk where pt palpably tight which seems to limit her end P/ROM  10/10/22: Manual Therapy In supine: Short neck, superficial abdominals and 5 diaphragmatic breaths, L axillary nodes, Lt intact anterior upper quadrant sequence and establishment of anterior interaxillary pathway, R inguinal nodes and establishment of Rt axilloinguinal pathway, then R breast moving fluid towards pathways spending extra time in any areas of fibrosis, Lt S/L for further work to lateral and inferior breast and then finished retracing all steps in supine.  10/05/2022 In supine: Short neck, 5 diaphragmatic breaths, L axillary nodes and establishment of interaxillary pathway, R inguinal nodes and establishment of axilloinguinal pathway, then R breast moving fluid towards pathways spending extra time in any areas of fibrosis then retracing all steps. Pt was educated in superficial nature of technique importance of stretch and performed all aspects of MLD to the right breast after therapist performed all. She was given illustrated and written  instructions.     PATIENT EDUCATION:  Education details: HEP and lymphedema risk reduction Person educated: Patient Education method: Explanation Education comprehension: verbalized understanding  HOME EXERCISE PROGRAM: Reviewed previously given post op HEP.   ASSESSMENT:  CLINICAL IMPRESSION: New area of fibrosis palpated at inferior breast today. This softened very well by end of session. Reviewed MLD technique with pt. She was doing well with correct skin stretch without sliding but did require cuing for lighter pressure. Also encouraged her to try working on incorporating self breast MLD twice a day when she is able as we can anticipate the breast lymphedema worsening before radiation is over. This will also help aid in healing after by keeping the lymphedema as minimized as possible. Pt verbalized good understanding. Also incorporated P/ROM and STM as pt is feeling some limited at end motions with P/ROM now.   Pt will benefit from skilled therapeutic intervention to improve on the following deficits: Decreased knowledge of precautions, impaired UE functional use, pain, decreased ROM, postural dysfunction.   PT treatment/interventions: ADL/Self care home management, Therapeutic exercises, Therapeutic activity, Patient/Family education, Self Care, Manual lymph drainage, Manual therapy, and Re-evaluation   GOALS: Goals reviewed with patient? Yes  LONG TERM GOALS:  (STG=LTG)  GOALS Name Target Date  Goal status  1 Pt will demonstrate she has regained full shoulder ROM and function post operatively compared to baselines.  Baseline: 10/29/2022 IN PROGRESS  2 Patient will report/demonstrate a >/= 50% reduction in right breast edema 10/29/2022 INITIAL  3 Patient will verbalize good understanding of performing self manual lymph drainage for right breast swelling. 10/29/2022 INITIAL     PLAN:  PT FREQUENCY/DURATION: 2x/week for 4 weeks  PLAN FOR NEXT SESSION: Manual lymph drainage  right breast, and review with pt prn. Also cont end P/ROM stretching to help decrease tightness from radiation.    [facility]  [address]  [place]  [address]  [phone]) [phone]   Hermenia Bers, PTA 10/12/2022, 11:18 AM

## 2022-10-15 ENCOUNTER — Ambulatory Visit
Admission: RE | Admit: 2022-10-15 | Discharge: 2022-10-15 | Disposition: A | Discharge status: Home / Self Care | Payer: 59 | Source: Ambulatory Visit | Attending: Radiation Oncology | Admitting: Radiation Oncology

## 2022-10-15 ENCOUNTER — Other Ambulatory Visit: Payer: Self-pay

## 2022-10-15 DIAGNOSIS — Z510 Encounter for antineoplastic radiation therapy: Secondary | ICD-10-CM | POA: Diagnosis not present

## 2022-10-15 DIAGNOSIS — C50511 Malignant neoplasm of lower-outer quadrant of right female breast: Secondary | ICD-10-CM | POA: Diagnosis not present

## 2022-10-15 DIAGNOSIS — Z170 Estrogen receptor positive status [ER+]: Secondary | ICD-10-CM | POA: Diagnosis not present

## 2022-10-15 LAB — RAD ONC ARIA SESSION SUMMARY
Course Elapsed Days: 14
Plan Fractions Treated to Date: 11
Plan Prescribed Dose Per Fraction: 1.8 Gy
Plan Total Fractions Prescribed: 28
Plan Total Prescribed Dose: 50.4 Gy
Reference Point Dosage Given to Date: 19.8 Gy
Reference Point Session Dosage Given: 1.8 Gy
Session Number: 11

## 2022-10-16 ENCOUNTER — Ambulatory Visit
Admission: RE | Admit: 2022-10-16 | Discharge: 2022-10-16 | Disposition: A | Discharge status: Home / Self Care | Payer: 59 | Source: Ambulatory Visit | Attending: Radiation Oncology | Admitting: Radiation Oncology

## 2022-10-16 ENCOUNTER — Other Ambulatory Visit: Payer: Self-pay

## 2022-10-16 ENCOUNTER — Ambulatory Visit: Payer: 59 | Admitting: Rehabilitation

## 2022-10-16 ENCOUNTER — Encounter: Payer: Self-pay | Admitting: Rehabilitation

## 2022-10-16 DIAGNOSIS — C50511 Malignant neoplasm of lower-outer quadrant of right female breast: Secondary | ICD-10-CM | POA: Diagnosis not present

## 2022-10-16 DIAGNOSIS — R293 Abnormal posture: Secondary | ICD-10-CM

## 2022-10-16 DIAGNOSIS — Z483 Aftercare following surgery for neoplasm: Secondary | ICD-10-CM | POA: Diagnosis not present

## 2022-10-16 DIAGNOSIS — Z170 Estrogen receptor positive status [ER+]: Secondary | ICD-10-CM | POA: Diagnosis not present

## 2022-10-16 DIAGNOSIS — Z510 Encounter for antineoplastic radiation therapy: Secondary | ICD-10-CM | POA: Diagnosis not present

## 2022-10-16 LAB — RAD ONC ARIA SESSION SUMMARY
Course Elapsed Days: 15
Plan Fractions Treated to Date: 12
Plan Prescribed Dose Per Fraction: 1.8 Gy
Plan Total Fractions Prescribed: 28
Plan Total Prescribed Dose: 50.4 Gy
Reference Point Dosage Given to Date: 21.6 Gy
Reference Point Session Dosage Given: 1.8 Gy
Session Number: 12

## 2022-10-16 NOTE — Therapy (Signed)
OUTPATIENT PHYSICAL THERAPY BREAST CANCER TREATMENT   Patient Name: [name] MRN: [id number] DOB:07-11-1975, 47 y.o., female Today's Date: 10/16/2022  END OF SESSION:  PT End of Session - 10/16/22 1006     Visit Number 6    Number of Visits 10    Date for PT Re-Evaluation 10/29/22    PT Start Time 1008   arrived late   PT Stop Time 1049   to leave for radiation   PT Time Calculation (min) 41 min    Activity Tolerance Patient tolerated treatment well    Behavior During Therapy WFL for tasks assessed/performed             Past Medical History:  Diagnosis Date   Abnormal Pap smear    Anxiety    Breast cancer (HCC)    Breast mass    left   Depression    GERD (gastroesophageal reflux disease)    Headache(784.0)    migraines   History of miscarriage    Hypertension    Incomplete RBBB    Palpitations    Syncope and collapse    Tachycardia    Vaginal Pap smear, abnormal    Past Surgical History:  Procedure Laterality Date   BREAST BIOPSY Right 07/31/2022   [place] RT BREAST BX W LOC DEV 1ST LESION IMG BX SPEC US GUIDE 07/31/2022 [facility]   BREAST BIOPSY  08/16/2022   MM RT RADIOACTIVE SEED LOC MAMMO GUIDE 08/16/2022 [facility]   BREAST ENHANCEMENT SURGERY  2012   BREAST LUMPECTOMY WITH RADIOACTIVE SEED AND SENTINEL LYMPH NODE BIOPSY Right 08/20/2022   Procedure: RIGHT BREAST LUMPECTOMY WITH RADIOACTIVE SEED AND SENTINEL LYMPH NODE BIOPSY;  Surgeon: Griselda Miner, MD;  Location: [facility];  Service: General;  Laterality: Right;  PEC BLOCK   CHOLECYSTECTOMY     DILATION AND CURETTAGE OF UTERUS  2008   LIPOMA EXCISION  2010   left leg   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   US ECHOCARDIOGRAPHY  06/17/2007   EF 55-60%   Patient Active Problem List   Diagnosis Date Noted   Genetic testing 08/16/2022   Family history of colon cancer in father 08/08/2022   Malignant neoplasm of lower-outer  quadrant of right breast of female, estrogen receptor positive (HCC) 08/06/2022   Preterm premature rupture of membranes 06/03/2017   Advanced maternal age in multigravida 12/20/2016   Abnormal chromosomal and genetic finding on antenatal screening of mother 12/20/2016   [redacted] weeks gestation of pregnancy    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 Chevis Pretty  REFERRING DIAG: Right breast cancer  THERAPY DIAG:  Malignant neoplasm of lower-outer quadrant of right breast of female, estrogen receptor positive (HCC)  Abnormal posture  Aftercare following surgery for neoplasm  Rationale for Evaluation and Treatment: Rehabilitation  ONSET DATE: 08/20/2022  SUBJECTIVE:  SUBJECTIVE STATEMENT: It has been feeling better the past 2 days but getting more red.   EVAL Patient reports she underwent a right lumpectomy and sentinel node biopsy (5 negative nodes) on 08/20/2022. Her Oncotype score was low so she will not need chemotherapy. She will proceed to radiation and anti-estrogen therapy.  Today is her first day of radiation.  PERTINENT HISTORY:  Patient was diagnosed on 07/19/2022 with right grade 2 invasive ductal carcinoma breast cancer. She underwent a right lumpectomy and sentinel node biopsy (5 negative nodes) on 08/20/2022. It is ER/PR positive and HER2 negative with a Ki67 of 3%. She had breast implants placed in 2012.   PATIENT GOALS:  Reassess how my recovery is going related to arm function, pain, and swelling.  PAIN:  Are you having pain? No - she has some numbness on her right posterior upper arm.  PRECAUTIONS: Recent Surgery, right UE Lymphedema risk  RED  FLAGS: None   ACTIVITY LEVEL / LEISURE: She goes to the gym 3-4x/week, does 30 min of cardio and uses weight equipment. She also runs a chicken farm. Since surgery, she has just been walking.     OBJECTIVE:   PATIENT SURVEYS:  QUICK DASH:     OBSERVATIONS: Significant edema present right breast; see photo. Incisions have healed well with no signs of infection or redness.   POSTURE:  Forward head and rounded shoulders  LYMPHEDEMA ASSESSMENT:   UPPER EXTREMITY AROM/PROM:   A/PROM RIGHT   eval   RIGHT 10/01/2022  Shoulder extension 53 52  Shoulder flexion 151 167  Shoulder abduction 164 172  Shoulder internal rotation 60 73  Shoulder external rotation 80 90                          (Blank rows = not tested)   A/PROM LEFT   eval  Shoulder extension 50  Shoulder flexion 150  Shoulder abduction 174  Shoulder internal rotation 62  Shoulder external rotation 85                          (Blank rows = not tested)   CERVICAL AROM: All within normal limits   UPPER EXTREMITY STRENGTH: WNL   LYMPHEDEMA ASSESSMENTS:    LANDMARK RIGHT   eval RIGHT 10/01/2022  10 cm proximal to olecranon process 28.1 29.4  Olecranon process 24.2 24.3  10 cm proximal to ulnar styloid process 22.4 22.3  Just proximal to ulnar styloid process 15.1 15.2  Across hand at thumb web space 16.8 16.9  At base of 2nd digit 5.8 5.6  (Blank rows = not tested)   LANDMARK LEFT   eval LEFT 10/01/2022  10 cm proximal to olecranon process 27.8 28  Olecranon process 23.1 23.2  10 cm proximal to ulnar styloid process 21 21.4  Just proximal to ulnar styloid process 14.7 14.6  Across hand at thumb web space 16.7 16.7  At base of 2nd digit 5.9 5.8  (Blank rows = not tested)    Surgery type/Date: 08/20/2022 right lumpectomy and sentinel node biopsy Number of lymph nodes removed: 5 Current/past treatment (chemo, radiation, hormone therapy): none Other symptoms:  Heaviness/tightness No Pain  No Pitting edema No Infections No Decreased scar mobility No Stemmer sign No   TREATMENT 10/16/22 MLD: In supine: Short neck, superficial abdominals and 5 diaphragmatic breaths, L axillary nodes, establishment of anterior interaxillary pathway, R inguinal nodes and establishment of Rt axilloinguinal  pathway, then Rt breast moving fluid towards pathways spending extra time in any areas of fibrosis, then retracing all steps.  P/ROM to Rt shoulder into flexion, abd and D2 with scapular depression throughout  10/12/22: Manual Therapy MLD: In supine: Short neck, superficial abdominals and 5 diaphragmatic breaths, L axillary nodes, Lt intact anterior upper quadrant sequence and establishment of anterior interaxillary pathway, R inguinal nodes and establishment of Rt axilloinguinal pathway, then Rt breast moving fluid towards pathways spending extra time in any areas of fibrosis, Lt S/L for further work to lateral and inferior breast spending extra time in areas of fibrosis palpated now at inferior breast and then finished retracing all steps in supine. Reviewed technique with pt. Tactile and VC's to decrease pressure but she was applying a correct skin stretch. P/ROM to Rt shoulder into flexion, abd and D2 with scapular depression throughout STM to Rt lateral trunk where pt palpably tight which seems to limit her end P/ROM  10/10/22: Manual Therapy In supine: Short neck, superficial abdominals and 5 diaphragmatic breaths, L axillary nodes, Lt intact anterior upper quadrant sequence and establishment of anterior interaxillary pathway, R inguinal nodes and establishment of Rt axilloinguinal pathway, then R breast moving fluid towards pathways spending extra time in any areas of fibrosis, Lt S/L for further work to lateral and inferior breast and then finished retracing all steps in supine.  10/05/2022 In supine: Short neck, 5 diaphragmatic breaths, L axillary nodes and establishment of interaxillary  pathway, R inguinal nodes and establishment of axilloinguinal pathway, then R breast moving fluid towards pathways spending extra time in any areas of fibrosis then retracing all steps. Pt was educated in superficial nature of technique importance of stretch and performed all aspects of MLD to the right breast after therapist performed all. She was given illustrated and written instructions.     PATIENT EDUCATION:  Education details: HEP and lymphedema risk reduction Person educated: Patient Education method: Explanation Education comprehension: verbalized understanding  HOME EXERCISE PROGRAM: Reviewed previously given post op HEP.   ASSESSMENT:  CLINICAL IMPRESSION: Pt reports the fibrotic locations are improved after last session.  Focused more on general edema with MLD today and less deep work.  Shortened session due to having to get to radiation.   Pt will benefit from skilled therapeutic intervention to improve on the following deficits: Decreased knowledge of precautions, impaired UE functional use, pain, decreased ROM, postural dysfunction.   PT treatment/interventions: ADL/Self care home management, Therapeutic exercises, Therapeutic activity, Patient/Family education, Self Care, Manual lymph drainage, Manual therapy, and Re-evaluation   GOALS: Goals reviewed with patient? Yes  LONG TERM GOALS:  (STG=LTG)  GOALS Name Target Date  Goal status  1 Pt will demonstrate she has regained full shoulder ROM and function post operatively compared to baselines.  Baseline: 10/29/2022 IN PROGRESS  2 Patient will report/demonstrate a >/= 50% reduction in right breast edema 10/29/2022 INITIAL  3 Patient will verbalize good understanding of performing self manual lymph drainage for right breast swelling. 10/29/2022 INITIAL     PLAN:  PT FREQUENCY/DURATION: 2x/week for 4 weeks  PLAN FOR NEXT SESSION: Manual lymph drainage right breast, and review with pt prn. Also cont end P/ROM  stretching to help decrease tightness from radiation.    [facility]  [address]  [place] [address]  [phone]   Idamae Lusher, PT 10/16/2022, 10:57 AM

## 2022-10-17 ENCOUNTER — Ambulatory Visit
Admission: RE | Admit: 2022-10-17 | Discharge: 2022-10-17 | Disposition: A | Discharge status: Home / Self Care | Payer: 59 | Source: Ambulatory Visit | Attending: Radiation Oncology | Admitting: Radiation Oncology

## 2022-10-17 ENCOUNTER — Other Ambulatory Visit: Payer: Self-pay

## 2022-10-17 DIAGNOSIS — C50511 Malignant neoplasm of lower-outer quadrant of right female breast: Secondary | ICD-10-CM | POA: Diagnosis not present

## 2022-10-17 DIAGNOSIS — Z510 Encounter for antineoplastic radiation therapy: Secondary | ICD-10-CM | POA: Diagnosis not present

## 2022-10-17 DIAGNOSIS — Z170 Estrogen receptor positive status [ER+]: Secondary | ICD-10-CM | POA: Diagnosis not present

## 2022-10-17 LAB — RAD ONC ARIA SESSION SUMMARY
Course Elapsed Days: 16
Plan Fractions Treated to Date: 13
Plan Prescribed Dose Per Fraction: 1.8 Gy
Plan Total Fractions Prescribed: 28
Plan Total Prescribed Dose: 50.4 Gy
Reference Point Dosage Given to Date: 23.4 Gy
Reference Point Session Dosage Given: 1.8 Gy
Session Number: 13

## 2022-10-18 ENCOUNTER — Ambulatory Visit
Admission: RE | Admit: 2022-10-18 | Discharge: 2022-10-18 | Disposition: A | Discharge status: Home / Self Care | Payer: 59 | Source: Ambulatory Visit | Attending: Radiation Oncology | Admitting: Radiation Oncology

## 2022-10-18 ENCOUNTER — Ambulatory Visit: Payer: 59

## 2022-10-18 ENCOUNTER — Other Ambulatory Visit: Payer: Self-pay

## 2022-10-18 DIAGNOSIS — C50511 Malignant neoplasm of lower-outer quadrant of right female breast: Secondary | ICD-10-CM | POA: Diagnosis not present

## 2022-10-18 DIAGNOSIS — Z170 Estrogen receptor positive status [ER+]: Secondary | ICD-10-CM | POA: Diagnosis not present

## 2022-10-18 DIAGNOSIS — Z510 Encounter for antineoplastic radiation therapy: Secondary | ICD-10-CM | POA: Diagnosis not present

## 2022-10-18 LAB — RAD ONC ARIA SESSION SUMMARY
Course Elapsed Days: 17
Plan Fractions Treated to Date: 14
Plan Prescribed Dose Per Fraction: 1.8 Gy
Plan Total Fractions Prescribed: 28
Plan Total Prescribed Dose: 50.4 Gy
Reference Point Dosage Given to Date: 25.2 Gy
Reference Point Session Dosage Given: 1.8 Gy
Session Number: 14

## 2022-10-19 ENCOUNTER — Other Ambulatory Visit: Payer: Self-pay

## 2022-10-19 ENCOUNTER — Ambulatory Visit
Admission: RE | Admit: 2022-10-19 | Discharge: 2022-10-19 | Disposition: A | Discharge status: Home / Self Care | Payer: 59 | Source: Ambulatory Visit | Attending: Radiation Oncology | Admitting: Radiation Oncology

## 2022-10-19 ENCOUNTER — Ambulatory Visit: Payer: 59

## 2022-10-19 DIAGNOSIS — Z510 Encounter for antineoplastic radiation therapy: Secondary | ICD-10-CM | POA: Diagnosis not present

## 2022-10-19 DIAGNOSIS — C50511 Malignant neoplasm of lower-outer quadrant of right female breast: Secondary | ICD-10-CM | POA: Diagnosis not present

## 2022-10-19 DIAGNOSIS — Z170 Estrogen receptor positive status [ER+]: Secondary | ICD-10-CM | POA: Diagnosis not present

## 2022-10-19 LAB — RAD ONC ARIA SESSION SUMMARY
Course Elapsed Days: 18
Plan Fractions Treated to Date: 15
Plan Prescribed Dose Per Fraction: 1.8 Gy
Plan Total Fractions Prescribed: 28
Plan Total Prescribed Dose: 50.4 Gy
Reference Point Dosage Given to Date: 27 Gy
Reference Point Session Dosage Given: 1.8 Gy
Session Number: 15

## 2022-10-23 ENCOUNTER — Ambulatory Visit
Admission: RE | Admit: 2022-10-23 | Discharge: 2022-10-23 | Disposition: A | Discharge status: Home / Self Care | Payer: 59 | Source: Ambulatory Visit | Attending: Radiation Oncology | Admitting: Radiation Oncology

## 2022-10-23 ENCOUNTER — Ambulatory Visit: Payer: 59 | Attending: General Surgery

## 2022-10-23 ENCOUNTER — Ambulatory Visit: Payer: 59

## 2022-10-23 ENCOUNTER — Other Ambulatory Visit: Payer: Self-pay

## 2022-10-23 DIAGNOSIS — Z170 Estrogen receptor positive status [ER+]: Secondary | ICD-10-CM | POA: Insufficient documentation

## 2022-10-23 DIAGNOSIS — R293 Abnormal posture: Secondary | ICD-10-CM | POA: Insufficient documentation

## 2022-10-23 DIAGNOSIS — Z483 Aftercare following surgery for neoplasm: Secondary | ICD-10-CM | POA: Insufficient documentation

## 2022-10-23 DIAGNOSIS — I10 Essential (primary) hypertension: Secondary | ICD-10-CM | POA: Diagnosis not present

## 2022-10-23 DIAGNOSIS — Z79810 Long term (current) use of selective estrogen receptor modulators (SERMs): Secondary | ICD-10-CM | POA: Diagnosis not present

## 2022-10-23 DIAGNOSIS — C50511 Malignant neoplasm of lower-outer quadrant of right female breast: Secondary | ICD-10-CM | POA: Diagnosis present

## 2022-10-23 DIAGNOSIS — K219 Gastro-esophageal reflux disease without esophagitis: Secondary | ICD-10-CM | POA: Diagnosis not present

## 2022-10-23 DIAGNOSIS — Z510 Encounter for antineoplastic radiation therapy: Secondary | ICD-10-CM | POA: Diagnosis present

## 2022-10-23 DIAGNOSIS — Z79899 Other long term (current) drug therapy: Secondary | ICD-10-CM | POA: Diagnosis not present

## 2022-10-23 LAB — RAD ONC ARIA SESSION SUMMARY
Course Elapsed Days: 22
Plan Fractions Treated to Date: 16
Plan Prescribed Dose Per Fraction: 1.8 Gy
Plan Total Fractions Prescribed: 28
Plan Total Prescribed Dose: 50.4 Gy
Reference Point Dosage Given to Date: 28.8 Gy
Reference Point Session Dosage Given: 1.8 Gy
Session Number: 16

## 2022-10-23 NOTE — Therapy (Signed)
OUTPATIENT PHYSICAL THERAPY BREAST CANCER TREATMENT   Patient Name: [name] MRN: [id number] DOB:[birth year], 47 y.o., female Today's Date: 10/23/2022  END OF SESSION:  PT End of Session - 10/23/22 1007     Visit Number 7    Number of Visits 10    Date for PT Re-Evaluation 10/29/22    PT Start Time 1000    PT Stop Time 1056    PT Time Calculation (min) 56 min    Activity Tolerance Patient tolerated treatment well    Behavior During Therapy WFL for tasks assessed/performed             Past Medical History:  Diagnosis Date   Abnormal Pap smear    Anxiety    Breast cancer (HCC)    Breast mass    left   Depression    GERD (gastroesophageal reflux disease)    Headache(784.0)    migraines   History of miscarriage    Hypertension    Incomplete RBBB    Palpitations    Syncope and collapse    Tachycardia    Vaginal Pap smear, abnormal    Past Surgical History:  Procedure Laterality Date   BREAST BIOPSY Right 07/31/2022   [place] RT BREAST BX W LOC DEV 1ST LESION IMG BX SPEC US GUIDE 07/31/2022 [facility]   BREAST BIOPSY  08/16/2022   MM RT RADIOACTIVE SEED LOC MAMMO GUIDE 08/16/2022 [facility]   BREAST ENHANCEMENT SURGERY  2012   BREAST LUMPECTOMY WITH RADIOACTIVE SEED AND SENTINEL LYMPH NODE BIOPSY Right 08/20/2022   Procedure: RIGHT BREAST LUMPECTOMY WITH RADIOACTIVE SEED AND SENTINEL LYMPH NODE BIOPSY;  Surgeon: Griselda Miner, MD;  Location: [facility];  Service: General;  Laterality: Right;  PEC BLOCK   CHOLECYSTECTOMY     DILATION AND CURETTAGE OF UTERUS  2008   LIPOMA EXCISION  2010   left leg   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   US ECHOCARDIOGRAPHY  06/17/2007   EF 55-60%   Patient Active Problem List   Diagnosis Date Noted   Genetic testing 08/16/2022   Family history of colon cancer in father 08/08/2022   Malignant neoplasm of lower-outer quadrant of right breast of female,  estrogen receptor positive (HCC) 08/06/2022   Preterm premature rupture of membranes 06/03/2017   Advanced maternal age in multigravida 12/20/2016   Abnormal chromosomal and genetic finding on antenatal screening of mother 12/20/2016   [redacted] weeks gestation of pregnancy    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 Chevis Pretty  REFERRING DIAG: Right breast cancer  THERAPY DIAG:  Malignant neoplasm of lower-outer quadrant of right breast of female, estrogen receptor positive (HCC)  Abnormal posture  Aftercare following surgery for neoplasm  Rationale for Evaluation and Treatment: Rehabilitation  ONSET DATE: 08/20/2022  SUBJECTIVE:  SUBJECTIVE STATEMENT: After the break from the long weekend my skin feels pretty good. The knotty area of scar tissue feels better too.   EVAL Patient reports she underwent a right lumpectomy and sentinel node biopsy (5 negative nodes) on 08/20/2022. Her Oncotype score was low so she will not need chemotherapy. She will proceed to radiation and anti-estrogen therapy.  Today is her first day of radiation.  PERTINENT HISTORY:  Patient was diagnosed on 07/19/2022 with right grade 2 invasive ductal carcinoma breast cancer. She underwent a right lumpectomy and sentinel node biopsy (5 negative nodes) on 08/20/2022. It is ER/PR positive and HER2 negative with a Ki67 of 3%. She had breast implants placed in 2012.   PATIENT GOALS:  Reassess how my recovery is going related to arm function, pain, and swelling.  PAIN:  Are you having pain? No - she has some numbness on her right posterior upper arm.  PRECAUTIONS: Recent Surgery, right UE Lymphedema risk  RED  FLAGS: None   ACTIVITY LEVEL / LEISURE: She goes to the gym 3-4x/week, does 30 min of cardio and uses weight equipment. She also runs a chicken farm. Since surgery, she has just been walking.     OBJECTIVE:   PATIENT SURVEYS:  QUICK DASH:     OBSERVATIONS: Significant edema present right breast; see photo. Incisions have healed well with no signs of infection or redness.   POSTURE:  Forward head and rounded shoulders  LYMPHEDEMA ASSESSMENT:   UPPER EXTREMITY AROM/PROM:   A/PROM RIGHT   eval   RIGHT 10/01/2022  Shoulder extension 53 52  Shoulder flexion 151 167  Shoulder abduction 164 172  Shoulder internal rotation 60 73  Shoulder external rotation 80 90                          (Blank rows = not tested)   A/PROM LEFT   eval  Shoulder extension 50  Shoulder flexion 150  Shoulder abduction 174  Shoulder internal rotation 62  Shoulder external rotation 85                          (Blank rows = not tested)   CERVICAL AROM: All within normal limits   UPPER EXTREMITY STRENGTH: WNL   LYMPHEDEMA ASSESSMENTS:    LANDMARK RIGHT   eval RIGHT 10/01/2022  10 cm proximal to olecranon process 28.1 29.4  Olecranon process 24.2 24.3  10 cm proximal to ulnar styloid process 22.4 22.3  Just proximal to ulnar styloid process 15.1 15.2  Across hand at thumb web space 16.8 16.9  At base of 2nd digit 5.8 5.6  (Blank rows = not tested)   LANDMARK LEFT   eval LEFT 10/01/2022  10 cm proximal to olecranon process 27.8 28  Olecranon process 23.1 23.2  10 cm proximal to ulnar styloid process 21 21.4  Just proximal to ulnar styloid process 14.7 14.6  Across hand at thumb web space 16.7 16.7  At base of 2nd digit 5.9 5.8  (Blank rows = not tested)    Surgery type/Date: 08/20/2022 right lumpectomy and sentinel node biopsy Number of lymph nodes removed: 5 Current/past treatment (chemo, radiation, hormone therapy): none Other symptoms:  Heaviness/tightness No Pain  No Pitting edema No Infections No Decreased scar mobility No Stemmer sign No   TREATMENT 10/23/22: Manual Therapy MLD: In supine: Short neck, superficial and deep abdominals, Lt axillary nodes, Lt anterior intact upper quadrant  sequence, establishment of anterior interaxillary pathway, R inguinal nodes and establishment of Rt axilloinguinal pathway, then Rt breast moving fluid towards pathways spending extra time in any areas of fibrosis, Lt S/L for further focus to lateral breast redirecting towards lateral anastomosis, then finished retracing all steps in supine.  P/ROM to Rt shoulder into flexion, abd and D2 with scapular depression throughout  10/16/22 MLD: In supine: Short neck, superficial abdominals and 5 diaphragmatic breaths, L axillary nodes, establishment of anterior interaxillary pathway, R inguinal nodes and establishment of Rt axilloinguinal pathway, then Rt breast moving fluid towards pathways spending extra time in any areas of fibrosis, then retracing all steps.  P/ROM to Rt shoulder into flexion, abd and D2 with scapular depression throughout  10/12/22: Manual Therapy MLD: In supine: Short neck, superficial abdominals and 5 diaphragmatic breaths, L axillary nodes, Lt intact anterior upper quadrant sequence and establishment of anterior interaxillary pathway, R inguinal nodes and establishment of Rt axilloinguinal pathway, then Rt breast moving fluid towards pathways spending extra time in any areas of fibrosis, Lt S/L for further work to lateral and inferior breast spending extra time in areas of fibrosis palpated now at inferior breast and then finished retracing all steps in supine. Reviewed technique with pt. Tactile and VC's to decrease pressure but she was applying a correct skin stretch. P/ROM to Rt shoulder into flexion, abd and D2 with scapular depression throughout STM to Rt lateral trunk where pt palpably tight which seems to limit her end P/ROM  10/10/22: Manual  Therapy In supine: Short neck, superficial abdominals and 5 diaphragmatic breaths, L axillary nodes, Lt intact anterior upper quadrant sequence and establishment of anterior interaxillary pathway, R inguinal nodes and establishment of Rt axilloinguinal pathway, then R breast moving fluid towards pathways spending extra time in any areas of fibrosis, Lt S/L for further work to lateral and inferior breast and then finished retracing all steps in supine.  10/05/2022 In supine: Short neck, 5 diaphragmatic breaths, L axillary nodes and establishment of interaxillary pathway, R inguinal nodes and establishment of axilloinguinal pathway, then R breast moving fluid towards pathways spending extra time in any areas of fibrosis then retracing all steps. Pt was educated in superficial nature of technique importance of stretch and performed all aspects of MLD to the right breast after therapist performed all. She was given illustrated and written instructions.     PATIENT EDUCATION:  Education details: HEP and lymphedema risk reduction Person educated: Patient Education method: Explanation Education comprehension: verbalized understanding  HOME EXERCISE PROGRAM: Reviewed previously given post op HEP.   ASSESSMENT:  CLINICAL IMPRESSION: Pt continues to report her Rt breast feeling improved, especially with 3 day weekend rest from radiation due to holiday. Her skin is becoming redder in appearance though so advised her that if she starts having increased sensitivity we may need to place her on hold until completing radiation. But we will cont to monitor her skin for this.   Pt will benefit from skilled therapeutic intervention to improve on the following deficits: Decreased knowledge of precautions, impaired UE functional use, pain, decreased ROM, postural dysfunction.   PT treatment/interventions: ADL/Self care home management, Therapeutic exercises, Therapeutic activity, Patient/Family education, Self  Care, Manual lymph drainage, Manual therapy, and Re-evaluation   GOALS: Goals reviewed with patient? Yes  LONG TERM GOALS:  (STG=LTG)  GOALS Name Target Date  Goal status  1 Pt will demonstrate she has regained full shoulder ROM and function post operatively compared to baselines.  Baseline: 10/29/2022 IN  PROGRESS  2 Patient will report/demonstrate a >/= 50% reduction in right breast edema 10/29/2022 INITIAL  3 Patient will verbalize good understanding of performing self manual lymph drainage for right breast swelling. 10/29/2022 INITIAL     PLAN:  PT FREQUENCY/DURATION: 2x/week for 4 weeks  PLAN FOR NEXT SESSION: Manual lymph drainage right breast, and review with pt prn. Also cont end P/ROM stretching to help decrease tightness from radiation.    [facility]  [address], [address]  [place] [address]  [phone]   Hermenia Bers, PTA 10/23/2022, 11:01 AM

## 2022-10-24 ENCOUNTER — Ambulatory Visit: Payer: 59

## 2022-10-24 ENCOUNTER — Other Ambulatory Visit: Payer: Self-pay

## 2022-10-24 ENCOUNTER — Ambulatory Visit
Admission: RE | Admit: 2022-10-24 | Discharge: 2022-10-24 | Disposition: A | Discharge status: Home / Self Care | Payer: 59 | Source: Ambulatory Visit | Attending: Radiation Oncology | Admitting: Radiation Oncology

## 2022-10-24 DIAGNOSIS — I10 Essential (primary) hypertension: Secondary | ICD-10-CM | POA: Diagnosis not present

## 2022-10-24 DIAGNOSIS — Z510 Encounter for antineoplastic radiation therapy: Secondary | ICD-10-CM | POA: Diagnosis not present

## 2022-10-24 DIAGNOSIS — K219 Gastro-esophageal reflux disease without esophagitis: Secondary | ICD-10-CM | POA: Diagnosis not present

## 2022-10-24 DIAGNOSIS — Z79810 Long term (current) use of selective estrogen receptor modulators (SERMs): Secondary | ICD-10-CM | POA: Diagnosis not present

## 2022-10-24 DIAGNOSIS — Z79899 Other long term (current) drug therapy: Secondary | ICD-10-CM | POA: Diagnosis not present

## 2022-10-24 DIAGNOSIS — Z170 Estrogen receptor positive status [ER+]: Secondary | ICD-10-CM | POA: Diagnosis not present

## 2022-10-24 DIAGNOSIS — C50511 Malignant neoplasm of lower-outer quadrant of right female breast: Secondary | ICD-10-CM | POA: Diagnosis not present

## 2022-10-24 LAB — RAD ONC ARIA SESSION SUMMARY
Course Elapsed Days: 23
Plan Fractions Treated to Date: 17
Plan Prescribed Dose Per Fraction: 1.8 Gy
Plan Total Fractions Prescribed: 28
Plan Total Prescribed Dose: 50.4 Gy
Reference Point Dosage Given to Date: 30.6 Gy
Reference Point Session Dosage Given: 1.8 Gy
Session Number: 17

## 2022-10-25 ENCOUNTER — Ambulatory Visit
Admission: RE | Admit: 2022-10-25 | Discharge: 2022-10-25 | Disposition: A | Discharge status: Home / Self Care | Payer: 59 | Source: Ambulatory Visit | Attending: Radiation Oncology | Admitting: Radiation Oncology

## 2022-10-25 ENCOUNTER — Other Ambulatory Visit: Payer: Self-pay

## 2022-10-25 ENCOUNTER — Ambulatory Visit: Payer: 59

## 2022-10-25 DIAGNOSIS — Z510 Encounter for antineoplastic radiation therapy: Secondary | ICD-10-CM | POA: Diagnosis not present

## 2022-10-25 DIAGNOSIS — Z79899 Other long term (current) drug therapy: Secondary | ICD-10-CM | POA: Diagnosis not present

## 2022-10-25 DIAGNOSIS — Z79810 Long term (current) use of selective estrogen receptor modulators (SERMs): Secondary | ICD-10-CM | POA: Diagnosis not present

## 2022-10-25 DIAGNOSIS — I10 Essential (primary) hypertension: Secondary | ICD-10-CM | POA: Diagnosis not present

## 2022-10-25 DIAGNOSIS — K219 Gastro-esophageal reflux disease without esophagitis: Secondary | ICD-10-CM | POA: Diagnosis not present

## 2022-10-25 DIAGNOSIS — Z170 Estrogen receptor positive status [ER+]: Secondary | ICD-10-CM | POA: Diagnosis not present

## 2022-10-25 DIAGNOSIS — C50511 Malignant neoplasm of lower-outer quadrant of right female breast: Secondary | ICD-10-CM | POA: Diagnosis not present

## 2022-10-25 LAB — RAD ONC ARIA SESSION SUMMARY
Course Elapsed Days: 24
Plan Fractions Treated to Date: 18
Plan Prescribed Dose Per Fraction: 1.8 Gy
Plan Total Fractions Prescribed: 28
Plan Total Prescribed Dose: 50.4 Gy
Reference Point Dosage Given to Date: 32.4 Gy
Reference Point Session Dosage Given: 1.8 Gy
Session Number: 18

## 2022-10-26 ENCOUNTER — Encounter: Payer: Self-pay | Admitting: Rehabilitation

## 2022-10-26 ENCOUNTER — Ambulatory Visit: Payer: 59 | Admitting: Rehabilitation

## 2022-10-26 ENCOUNTER — Other Ambulatory Visit: Payer: Self-pay

## 2022-10-26 ENCOUNTER — Ambulatory Visit
Admission: RE | Admit: 2022-10-26 | Discharge: 2022-10-26 | Disposition: A | Discharge status: Home / Self Care | Payer: 59 | Source: Ambulatory Visit | Attending: Radiation Oncology | Admitting: Radiation Oncology

## 2022-10-26 DIAGNOSIS — R293 Abnormal posture: Secondary | ICD-10-CM | POA: Diagnosis not present

## 2022-10-26 DIAGNOSIS — Z510 Encounter for antineoplastic radiation therapy: Secondary | ICD-10-CM | POA: Diagnosis not present

## 2022-10-26 DIAGNOSIS — K219 Gastro-esophageal reflux disease without esophagitis: Secondary | ICD-10-CM | POA: Diagnosis not present

## 2022-10-26 DIAGNOSIS — C50511 Malignant neoplasm of lower-outer quadrant of right female breast: Secondary | ICD-10-CM | POA: Diagnosis not present

## 2022-10-26 DIAGNOSIS — Z79810 Long term (current) use of selective estrogen receptor modulators (SERMs): Secondary | ICD-10-CM | POA: Diagnosis not present

## 2022-10-26 DIAGNOSIS — Z170 Estrogen receptor positive status [ER+]: Secondary | ICD-10-CM | POA: Diagnosis not present

## 2022-10-26 DIAGNOSIS — I10 Essential (primary) hypertension: Secondary | ICD-10-CM | POA: Diagnosis not present

## 2022-10-26 DIAGNOSIS — Z483 Aftercare following surgery for neoplasm: Secondary | ICD-10-CM

## 2022-10-26 DIAGNOSIS — Z79899 Other long term (current) drug therapy: Secondary | ICD-10-CM | POA: Diagnosis not present

## 2022-10-26 LAB — RAD ONC ARIA SESSION SUMMARY
Course Elapsed Days: 25
Plan Fractions Treated to Date: 19
Plan Prescribed Dose Per Fraction: 1.8 Gy
Plan Total Fractions Prescribed: 28
Plan Total Prescribed Dose: 50.4 Gy
Reference Point Dosage Given to Date: 34.2 Gy
Reference Point Session Dosage Given: 1.8 Gy
Session Number: 19

## 2022-10-26 NOTE — Therapy (Signed)
OUTPATIENT PHYSICAL THERAPY BREAST CANCER TREATMENT   Patient Name: [name] MRN: [id number] DOB:1975-11-15, 47 y.o., female Today's Date: 10/26/2022  END OF SESSION:  PT End of Session - 10/26/22 0958     Visit Number 8    Number of Visits 16    Date for PT Re-Evaluation 11/23/22    PT Start Time 1000    PT Stop Time 1042   leaving for radiation   PT Time Calculation (min) 42 min    Activity Tolerance Patient tolerated treatment well    Behavior During Therapy WFL for tasks assessed/performed             Past Medical History:  Diagnosis Date   Abnormal Pap smear    Anxiety    Breast cancer (HCC)    Breast mass    left   Depression    GERD (gastroesophageal reflux disease)    Headache(784.0)    migraines   History of miscarriage    Hypertension    Incomplete RBBB    Palpitations    Syncope and collapse    Tachycardia    Vaginal Pap smear, abnormal    Past Surgical History:  Procedure Laterality Date   BREAST BIOPSY Right 07/31/2022   [place] RT BREAST BX W LOC DEV 1ST LESION IMG BX SPEC US GUIDE 07/31/2022 [facility]   BREAST BIOPSY  08/16/2022   MM RT RADIOACTIVE SEED LOC MAMMO GUIDE 08/16/2022 [facility]   BREAST ENHANCEMENT SURGERY  2012   BREAST LUMPECTOMY WITH RADIOACTIVE SEED AND SENTINEL LYMPH NODE BIOPSY Right 08/20/2022   Procedure: RIGHT BREAST LUMPECTOMY WITH RADIOACTIVE SEED AND SENTINEL LYMPH NODE BIOPSY;  Surgeon: Griselda Miner, MD;  Location: [facility];  Service: General;  Laterality: Right;  PEC BLOCK   CHOLECYSTECTOMY     DILATION AND CURETTAGE OF UTERUS  2008   LIPOMA EXCISION  2010   left leg   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   US ECHOCARDIOGRAPHY  06/17/2007   EF 55-60%   Patient Active Problem List   Diagnosis Date Noted   Genetic testing 08/16/2022   Family history of colon cancer in father 08/08/2022   Malignant neoplasm of lower-outer quadrant of right  breast of female, estrogen receptor positive (HCC) 08/06/2022   Preterm premature rupture of membranes 06/03/2017   Advanced maternal age in multigravida 12/20/2016   Abnormal chromosomal and genetic finding on antenatal screening of mother 12/20/2016   [redacted] weeks gestation of pregnancy    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 Chevis Pretty  REFERRING DIAG: Right breast cancer  THERAPY DIAG:  Malignant neoplasm of lower-outer quadrant of right breast of female, estrogen receptor positive (HCC)  Abnormal posture  Aftercare following surgery for neoplasm  Rationale for Evaluation and Treatment: Rehabilitation  ONSET DATE: 08/20/2022  SUBJECTIVE:  SUBJECTIVE STATEMENT:  I fell going into the cancer center and am a bit more sore in the Rt breast and arm but it feels better already today.    EVAL Patient reports she underwent a right lumpectomy and sentinel node biopsy (5 negative nodes) on 08/20/2022. Her Oncotype score was low so she will not need chemotherapy. She will proceed to radiation and anti-estrogen therapy.  Today is her first day of radiation.  PERTINENT HISTORY:  Patient was diagnosed on 07/19/2022 with right grade 2 invasive ductal carcinoma breast cancer. She underwent a right lumpectomy and sentinel node biopsy (5 negative nodes) on 08/20/2022. It is ER/PR positive and HER2 negative with a Ki67 of 3%. She had breast implants placed in 2012.   PATIENT GOALS:  Reassess how my recovery is going related to arm function, pain, and swelling.  PAIN:  Are you having pain? No - she has some numbness on her right posterior upper arm.  PRECAUTIONS: Recent Surgery, right UE  Lymphedema risk  RED FLAGS: None   ACTIVITY LEVEL / LEISURE: She goes to the gym 3-4x/week, does 30 min of cardio and uses weight equipment. She also runs a chicken farm. Since surgery, she has just been walking.     OBJECTIVE:   PATIENT SURVEYS:  QUICK DASH:     OBSERVATIONS: Significant edema present right breast; see photo. Incisions have healed well with no signs of infection or redness.  POSTURE:  Forward head and rounded shoulders  LYMPHEDEMA ASSESSMENT:   UPPER EXTREMITY AROM/PROM:   A/PROM RIGHT   eval   RIGHT 10/01/2022  Shoulder extension 53 52  Shoulder flexion 151 167  Shoulder abduction 164 172  Shoulder internal rotation 60 73  Shoulder external rotation 80 90                          (Blank rows = not tested)   A/PROM LEFT   eval  Shoulder extension 50  Shoulder flexion 150  Shoulder abduction 174  Shoulder internal rotation 62  Shoulder external rotation 85                          (Blank rows = not tested)   CERVICAL AROM: All within normal limits   UPPER EXTREMITY STRENGTH: WNL   LYMPHEDEMA ASSESSMENTS:    LANDMARK RIGHT   eval RIGHT 10/01/2022  10 cm proximal to olecranon process 28.1 29.4  Olecranon process 24.2 24.3  10 cm proximal to ulnar styloid process 22.4 22.3  Just proximal to ulnar styloid process 15.1 15.2  Across hand at thumb web space 16.8 16.9  At base of 2nd digit 5.8 5.6  (Blank rows = not tested)   LANDMARK LEFT   eval LEFT 10/01/2022  10 cm proximal to olecranon process 27.8 28  Olecranon process 23.1 23.2  10 cm proximal to ulnar styloid process 21 21.4  Just proximal to ulnar styloid process 14.7 14.6  Across hand at thumb web space 16.7 16.7  At base of 2nd digit 5.9 5.8  (Blank rows = not tested)    Surgery type/Date: 08/20/2022 right lumpectomy and sentinel node biopsy Number of lymph nodes removed: 5 Current/past treatment (chemo, radiation, hormone therapy): none Other symptoms:   Heaviness/tightness No Pain No Pitting edema No Infections No Decreased scar mobility No Stemmer sign No   TREATMENT 10/26/22 Manual Therapy MLD: In supine: Short neck, superficial and deep abdominals, Lt axillary nodes,  Lt anterior intact upper quadrant sequence, establishment of anterior interaxillary pathway, R inguinal nodes and establishment of Rt axilloinguinal pathway, then Rt breast moving fluid towards pathways spending extra time in any areas of fibrosis, Lt S/L for further focus to lateral breast redirecting towards lateral anastomosis, then finished retracing all steps in supine.  P/ROM to Rt shoulder into flexion, abd and D2 with scapular depression throughout  10/23/22: Manual Therapy MLD: In supine: Short neck, superficial and deep abdominals, Lt axillary nodes, Lt anterior intact upper quadrant sequence, establishment of anterior interaxillary pathway, R inguinal nodes and establishment of Rt axilloinguinal pathway, then Rt breast moving fluid towards pathways spending extra time in any areas of fibrosis, Lt S/L for further focus to lateral breast redirecting towards lateral anastomosis, then finished retracing all steps in supine.  P/ROM to Rt shoulder into flexion, abd and D2 with scapular depression throughout  10/16/22 MLD: In supine: Short neck, superficial abdominals and 5 diaphragmatic breaths, L axillary nodes, establishment of anterior interaxillary pathway, R inguinal nodes and establishment of Rt axilloinguinal pathway, then Rt breast moving fluid towards pathways spending extra time in any areas of fibrosis, then retracing all steps.  P/ROM to Rt shoulder into flexion, abd and D2 with scapular depression throughout  10/12/22: Manual Therapy MLD: In supine: Short neck, superficial abdominals and 5 diaphragmatic breaths, L axillary nodes, Lt intact anterior upper quadrant sequence and establishment of anterior interaxillary pathway, R inguinal nodes and establishment of  Rt axilloinguinal pathway, then Rt breast moving fluid towards pathways spending extra time in any areas of fibrosis, Lt S/L for further work to lateral and inferior breast spending extra time in areas of fibrosis palpated now at inferior breast and then finished retracing all steps in supine. Reviewed technique with pt. Tactile and VC's to decrease pressure but she was applying a correct skin stretch. P/ROM to Rt shoulder into flexion, abd and D2 with scapular depression throughout STM to Rt lateral trunk where pt palpably tight which seems to limit her end P/ROM  10/10/22: Manual Therapy In supine: Short neck, superficial abdominals and 5 diaphragmatic breaths, L axillary nodes, Lt intact anterior upper quadrant sequence and establishment of anterior interaxillary pathway, R inguinal nodes and establishment of Rt axilloinguinal pathway, then R breast moving fluid towards pathways spending extra time in any areas of fibrosis, Lt S/L for further work to lateral and inferior breast and then finished retracing all steps in supine.  10/05/2022 In supine: Short neck, 5 diaphragmatic breaths, L axillary nodes and establishment of interaxillary pathway, R inguinal nodes and establishment of axilloinguinal pathway, then R breast moving fluid towards pathways spending extra time in any areas of fibrosis then retracing all steps. Pt was educated in superficial nature of technique importance of stretch and performed all aspects of MLD to the right breast after therapist performed all. She was given illustrated and written instructions.     PATIENT EDUCATION:  Education details: HEP and lymphedema risk reduction Person educated: Patient Education method: Explanation Education comprehension: verbalized understanding  HOME EXERCISE PROGRAM: Reviewed previously given post op HEP.   ASSESSMENT:  CLINICAL IMPRESSION: Pt continues to report her Rt breast feeling improved with PT sessions and we extended  her POC x 4 weeks. Her skin is becoming redder in appearance though so advised her that if she starts having increased sensitivity we may need to place her on hold until completing radiation. But we will cont to monitor her skin for this.   Pt will benefit from  skilled therapeutic intervention to improve on the following deficits: Decreased knowledge of precautions, impaired UE functional use, pain, decreased ROM, postural dysfunction.   PT treatment/interventions: ADL/Self care home management, Therapeutic exercises, Therapeutic activity, Patient/Family education, Self Care, Manual lymph drainage, Manual therapy, and Re-evaluation   GOALS: Goals reviewed with patient? Yes  LONG TERM GOALS:  (STG=LTG)  GOALS Name Target Date  Goal status  1 Pt will demonstrate she has regained full shoulder ROM and function post operatively compared to baselines.  Baseline: 10/29/2022 IN PROGRESS  2 Patient will report/demonstrate a >/= 50% reduction in right breast edema 10/29/2022 INITIAL  3 Patient will verbalize good understanding of performing self manual lymph drainage for right breast swelling. 10/29/2022 INITIAL     PLAN:  PT FREQUENCY/DURATION: 2x/week for 4 weeks  PLAN FOR NEXT SESSION: Manual lymph drainage right breast, and review with pt prn. Also cont end P/ROM stretching to help decrease tightness from radiation.    [facility]  [address]  [place] [address]  [phone]   Idamae Lusher, PT 10/26/2022, 10:43 AM

## 2022-10-29 ENCOUNTER — Ambulatory Visit
Admission: RE | Admit: 2022-10-29 | Discharge: 2022-10-29 | Disposition: A | Discharge status: Home / Self Care | Payer: 59 | Source: Ambulatory Visit | Attending: Radiation Oncology | Admitting: Radiation Oncology

## 2022-10-29 ENCOUNTER — Other Ambulatory Visit: Payer: Self-pay

## 2022-10-29 DIAGNOSIS — Z170 Estrogen receptor positive status [ER+]: Secondary | ICD-10-CM | POA: Diagnosis not present

## 2022-10-29 DIAGNOSIS — Z79810 Long term (current) use of selective estrogen receptor modulators (SERMs): Secondary | ICD-10-CM | POA: Diagnosis not present

## 2022-10-29 DIAGNOSIS — K219 Gastro-esophageal reflux disease without esophagitis: Secondary | ICD-10-CM | POA: Diagnosis not present

## 2022-10-29 DIAGNOSIS — C50511 Malignant neoplasm of lower-outer quadrant of right female breast: Secondary | ICD-10-CM | POA: Diagnosis not present

## 2022-10-29 DIAGNOSIS — Z510 Encounter for antineoplastic radiation therapy: Secondary | ICD-10-CM | POA: Diagnosis not present

## 2022-10-29 DIAGNOSIS — I10 Essential (primary) hypertension: Secondary | ICD-10-CM | POA: Diagnosis not present

## 2022-10-29 DIAGNOSIS — Z79899 Other long term (current) drug therapy: Secondary | ICD-10-CM | POA: Diagnosis not present

## 2022-10-29 LAB — RAD ONC ARIA SESSION SUMMARY
Course Elapsed Days: 28
Plan Fractions Treated to Date: 20
Plan Prescribed Dose Per Fraction: 1.8 Gy
Plan Total Fractions Prescribed: 28
Plan Total Prescribed Dose: 50.4 Gy
Reference Point Dosage Given to Date: 36 Gy
Reference Point Session Dosage Given: 1.8 Gy
Session Number: 20

## 2022-10-30 ENCOUNTER — Ambulatory Visit
Admission: RE | Admit: 2022-10-30 | Discharge: 2022-10-30 | Disposition: A | Discharge status: Home / Self Care | Payer: 59 | Source: Ambulatory Visit | Attending: Radiation Oncology | Admitting: Radiation Oncology

## 2022-10-30 ENCOUNTER — Other Ambulatory Visit: Payer: Self-pay

## 2022-10-30 ENCOUNTER — Ambulatory Visit: Payer: 59 | Admitting: Radiation Oncology

## 2022-10-30 DIAGNOSIS — Z170 Estrogen receptor positive status [ER+]: Secondary | ICD-10-CM | POA: Diagnosis not present

## 2022-10-30 DIAGNOSIS — I10 Essential (primary) hypertension: Secondary | ICD-10-CM | POA: Diagnosis not present

## 2022-10-30 DIAGNOSIS — Z79810 Long term (current) use of selective estrogen receptor modulators (SERMs): Secondary | ICD-10-CM | POA: Diagnosis not present

## 2022-10-30 DIAGNOSIS — C50511 Malignant neoplasm of lower-outer quadrant of right female breast: Secondary | ICD-10-CM | POA: Diagnosis not present

## 2022-10-30 DIAGNOSIS — Z79899 Other long term (current) drug therapy: Secondary | ICD-10-CM | POA: Diagnosis not present

## 2022-10-30 DIAGNOSIS — Z510 Encounter for antineoplastic radiation therapy: Secondary | ICD-10-CM | POA: Diagnosis not present

## 2022-10-30 DIAGNOSIS — K219 Gastro-esophageal reflux disease without esophagitis: Secondary | ICD-10-CM | POA: Diagnosis not present

## 2022-10-30 DIAGNOSIS — N921 Excessive and frequent menstruation with irregular cycle: Secondary | ICD-10-CM | POA: Diagnosis not present

## 2022-10-30 LAB — RAD ONC ARIA SESSION SUMMARY
Course Elapsed Days: 29
Plan Fractions Treated to Date: 21
Plan Prescribed Dose Per Fraction: 1.8 Gy
Plan Total Fractions Prescribed: 28
Plan Total Prescribed Dose: 50.4 Gy
Reference Point Dosage Given to Date: 37.8 Gy
Reference Point Session Dosage Given: 1.8 Gy
Session Number: 21

## 2022-10-30 MED ORDER — RADIAPLEXRX EX GEL: Freq: Once | CUTANEOUS | Status: AC

## 2022-10-31 ENCOUNTER — Ambulatory Visit
Admission: RE | Admit: 2022-10-31 | Discharge: 2022-10-31 | Disposition: A | Discharge status: Home / Self Care | Payer: 59 | Source: Ambulatory Visit | Attending: Radiation Oncology | Admitting: Radiation Oncology

## 2022-10-31 ENCOUNTER — Other Ambulatory Visit: Payer: Self-pay

## 2022-10-31 DIAGNOSIS — K219 Gastro-esophageal reflux disease without esophagitis: Secondary | ICD-10-CM | POA: Diagnosis not present

## 2022-10-31 DIAGNOSIS — Z510 Encounter for antineoplastic radiation therapy: Secondary | ICD-10-CM | POA: Diagnosis not present

## 2022-10-31 DIAGNOSIS — Z79810 Long term (current) use of selective estrogen receptor modulators (SERMs): Secondary | ICD-10-CM | POA: Diagnosis not present

## 2022-10-31 DIAGNOSIS — Z170 Estrogen receptor positive status [ER+]: Secondary | ICD-10-CM | POA: Diagnosis not present

## 2022-10-31 DIAGNOSIS — I10 Essential (primary) hypertension: Secondary | ICD-10-CM | POA: Diagnosis not present

## 2022-10-31 DIAGNOSIS — C50511 Malignant neoplasm of lower-outer quadrant of right female breast: Secondary | ICD-10-CM | POA: Diagnosis not present

## 2022-10-31 DIAGNOSIS — Z79899 Other long term (current) drug therapy: Secondary | ICD-10-CM | POA: Diagnosis not present

## 2022-10-31 LAB — RAD ONC ARIA SESSION SUMMARY
Course Elapsed Days: 30
Plan Fractions Treated to Date: 22
Plan Prescribed Dose Per Fraction: 1.8 Gy
Plan Total Fractions Prescribed: 28
Plan Total Prescribed Dose: 50.4 Gy
Reference Point Dosage Given to Date: 39.6 Gy
Reference Point Session Dosage Given: 1.8 Gy
Session Number: 22

## 2022-11-01 ENCOUNTER — Ambulatory Visit
Admission: RE | Admit: 2022-11-01 | Discharge: 2022-11-01 | Disposition: A | Discharge status: Home / Self Care | Payer: 59 | Source: Ambulatory Visit | Attending: Radiation Oncology | Admitting: Radiation Oncology

## 2022-11-01 ENCOUNTER — Other Ambulatory Visit: Payer: Self-pay

## 2022-11-01 DIAGNOSIS — Z170 Estrogen receptor positive status [ER+]: Secondary | ICD-10-CM | POA: Diagnosis not present

## 2022-11-01 DIAGNOSIS — K219 Gastro-esophageal reflux disease without esophagitis: Secondary | ICD-10-CM | POA: Diagnosis not present

## 2022-11-01 DIAGNOSIS — C50511 Malignant neoplasm of lower-outer quadrant of right female breast: Secondary | ICD-10-CM | POA: Diagnosis not present

## 2022-11-01 DIAGNOSIS — Z79810 Long term (current) use of selective estrogen receptor modulators (SERMs): Secondary | ICD-10-CM | POA: Diagnosis not present

## 2022-11-01 DIAGNOSIS — I10 Essential (primary) hypertension: Secondary | ICD-10-CM | POA: Diagnosis not present

## 2022-11-01 DIAGNOSIS — Z79899 Other long term (current) drug therapy: Secondary | ICD-10-CM | POA: Diagnosis not present

## 2022-11-01 DIAGNOSIS — Z510 Encounter for antineoplastic radiation therapy: Secondary | ICD-10-CM | POA: Diagnosis not present

## 2022-11-01 LAB — RAD ONC ARIA SESSION SUMMARY
Course Elapsed Days: 31
Plan Fractions Treated to Date: 23
Plan Prescribed Dose Per Fraction: 1.8 Gy
Plan Total Fractions Prescribed: 28
Plan Total Prescribed Dose: 50.4 Gy
Reference Point Dosage Given to Date: 41.4 Gy
Reference Point Session Dosage Given: 1.8 Gy
Session Number: 23

## 2022-11-02 ENCOUNTER — Ambulatory Visit
Admission: RE | Admit: 2022-11-02 | Discharge: 2022-11-02 | Disposition: A | Discharge status: Home / Self Care | Payer: 59 | Source: Ambulatory Visit | Attending: Radiation Oncology | Admitting: Radiation Oncology

## 2022-11-02 ENCOUNTER — Other Ambulatory Visit: Payer: Self-pay

## 2022-11-02 DIAGNOSIS — Z170 Estrogen receptor positive status [ER+]: Secondary | ICD-10-CM | POA: Diagnosis not present

## 2022-11-02 DIAGNOSIS — Z79899 Other long term (current) drug therapy: Secondary | ICD-10-CM | POA: Diagnosis not present

## 2022-11-02 DIAGNOSIS — Z79810 Long term (current) use of selective estrogen receptor modulators (SERMs): Secondary | ICD-10-CM | POA: Diagnosis not present

## 2022-11-02 DIAGNOSIS — K219 Gastro-esophageal reflux disease without esophagitis: Secondary | ICD-10-CM | POA: Diagnosis not present

## 2022-11-02 DIAGNOSIS — Z510 Encounter for antineoplastic radiation therapy: Secondary | ICD-10-CM | POA: Diagnosis not present

## 2022-11-02 DIAGNOSIS — C50511 Malignant neoplasm of lower-outer quadrant of right female breast: Secondary | ICD-10-CM | POA: Diagnosis not present

## 2022-11-02 DIAGNOSIS — I10 Essential (primary) hypertension: Secondary | ICD-10-CM | POA: Diagnosis not present

## 2022-11-02 LAB — RAD ONC ARIA SESSION SUMMARY
Course Elapsed Days: 32
Plan Fractions Treated to Date: 24
Plan Prescribed Dose Per Fraction: 1.8 Gy
Plan Total Fractions Prescribed: 28
Plan Total Prescribed Dose: 50.4 Gy
Reference Point Dosage Given to Date: 43.2 Gy
Reference Point Session Dosage Given: 1.8 Gy
Session Number: 24

## 2022-11-05 ENCOUNTER — Other Ambulatory Visit: Payer: Self-pay

## 2022-11-05 ENCOUNTER — Ambulatory Visit
Admission: RE | Admit: 2022-11-05 | Discharge: 2022-11-05 | Disposition: A | Discharge status: Home / Self Care | Payer: 59 | Source: Ambulatory Visit | Attending: Radiation Oncology | Admitting: Radiation Oncology

## 2022-11-05 ENCOUNTER — Inpatient Hospital Stay: Payer: 59 | Attending: Hematology and Oncology | Admitting: Hematology and Oncology

## 2022-11-05 ENCOUNTER — Encounter: Payer: Self-pay | Admitting: Rehabilitation

## 2022-11-05 ENCOUNTER — Ambulatory Visit: Payer: 59 | Admitting: Rehabilitation

## 2022-11-05 VITALS — BP 126/91 | HR 80 | Temp 97.5°F | Resp 18 | Ht 65.0 in | Wt 169.3 lb

## 2022-11-05 DIAGNOSIS — K219 Gastro-esophageal reflux disease without esophagitis: Secondary | ICD-10-CM | POA: Insufficient documentation

## 2022-11-05 DIAGNOSIS — Z79810 Long term (current) use of selective estrogen receptor modulators (SERMs): Secondary | ICD-10-CM | POA: Insufficient documentation

## 2022-11-05 DIAGNOSIS — I10 Essential (primary) hypertension: Secondary | ICD-10-CM | POA: Diagnosis not present

## 2022-11-05 DIAGNOSIS — Z483 Aftercare following surgery for neoplasm: Secondary | ICD-10-CM | POA: Diagnosis not present

## 2022-11-05 DIAGNOSIS — Z79899 Other long term (current) drug therapy: Secondary | ICD-10-CM | POA: Insufficient documentation

## 2022-11-05 DIAGNOSIS — C50511 Malignant neoplasm of lower-outer quadrant of right female breast: Secondary | ICD-10-CM | POA: Diagnosis not present

## 2022-11-05 DIAGNOSIS — Z510 Encounter for antineoplastic radiation therapy: Secondary | ICD-10-CM | POA: Insufficient documentation

## 2022-11-05 DIAGNOSIS — R293 Abnormal posture: Secondary | ICD-10-CM | POA: Diagnosis not present

## 2022-11-05 DIAGNOSIS — Z170 Estrogen receptor positive status [ER+]: Secondary | ICD-10-CM

## 2022-11-05 LAB — RAD ONC ARIA SESSION SUMMARY
Course Elapsed Days: 35
Plan Fractions Treated to Date: 25
Plan Prescribed Dose Per Fraction: 1.8 Gy
Plan Total Fractions Prescribed: 28
Plan Total Prescribed Dose: 50.4 Gy
Reference Point Dosage Given to Date: 45 Gy
Reference Point Session Dosage Given: 1.8 Gy
Session Number: 25

## 2022-11-05 MED ORDER — TAMOXIFEN CITRATE 20 MG PO TABS: 20.0000 mg | ORAL_TABLET | Freq: Every day | ORAL | 3 refills | Status: DC

## 2022-11-05 NOTE — Therapy (Signed)
OUTPATIENT PHYSICAL THERAPY BREAST CANCER TREATMENT   Patient Name: [name] MRN: [id number] DOB:[birth year], 47 y.o., female Today's Date: 11/05/2022  END OF SESSION:  PT End of Session - 11/05/22 1002     Visit Number 9    Number of Visits 16    Date for PT Re-Evaluation 11/23/22    PT Start Time 1003    PT Stop Time 1047    PT Time Calculation (min) 44 min    Activity Tolerance Patient tolerated treatment well    Behavior During Therapy WFL for tasks assessed/performed             Past Medical History:  Diagnosis Date   Abnormal Pap smear    Anxiety    Breast cancer (HCC)    Breast mass    left   Depression    GERD (gastroesophageal reflux disease)    Headache(784.0)    migraines   History of miscarriage    Hypertension    Incomplete RBBB    Palpitations    Syncope and collapse    Tachycardia    Vaginal Pap smear, abnormal    Past Surgical History:  Procedure Laterality Date   BREAST BIOPSY Right 07/31/2022   [place] RT BREAST BX W LOC DEV 1ST LESION IMG BX SPEC US GUIDE 07/31/2022 [facility]   BREAST BIOPSY  08/16/2022   MM RT RADIOACTIVE SEED LOC MAMMO GUIDE 08/16/2022 [facility]   BREAST ENHANCEMENT SURGERY  2012   BREAST LUMPECTOMY WITH RADIOACTIVE SEED AND SENTINEL LYMPH NODE BIOPSY Right 08/20/2022   Procedure: RIGHT BREAST LUMPECTOMY WITH RADIOACTIVE SEED AND SENTINEL LYMPH NODE BIOPSY;  Surgeon: Griselda Miner, MD;  Location: [facility];  Service: General;  Laterality: Right;  PEC BLOCK   CHOLECYSTECTOMY     DILATION AND CURETTAGE OF UTERUS  2008   LIPOMA EXCISION  2010   left leg   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   US ECHOCARDIOGRAPHY  06/17/2007   EF 55-60%   Patient Active Problem List   Diagnosis Date Noted   Genetic testing 08/16/2022   Family history of colon cancer in father 08/08/2022   Malignant neoplasm of lower-outer quadrant of right breast of female,  estrogen receptor positive (HCC) 08/06/2022   Preterm premature rupture of membranes 06/03/2017   Advanced maternal age in multigravida 12/20/2016   Abnormal chromosomal and genetic finding on antenatal screening of mother 12/20/2016   [redacted] weeks gestation of pregnancy    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 Chevis Pretty  REFERRING DIAG: Right breast cancer  THERAPY DIAG:  Malignant neoplasm of lower-outer quadrant of right breast of female, estrogen receptor positive (HCC)  Aftercare following surgery for neoplasm  Abnormal posture  Rationale for Evaluation and Treatment: Rehabilitation  ONSET DATE: 08/20/2022  SUBJECTIVE:  SUBJECTIVE STATEMENT:  Starting to get red and open under the breast    EVAL Patient reports she underwent a right lumpectomy and sentinel node biopsy (5 negative nodes) on 08/20/2022. Her Oncotype score was low so she will not need chemotherapy. She will proceed to radiation and anti-estrogen therapy.  Today is her first day of radiation.  PERTINENT HISTORY:  Patient was diagnosed on 07/19/2022 with right grade 2 invasive ductal carcinoma breast cancer. She underwent a right lumpectomy and sentinel node biopsy (5 negative nodes) on 08/20/2022. It is ER/PR positive and HER2 negative with a Ki67 of 3%. She had breast implants placed in 2012.   PATIENT GOALS:  Reassess how my recovery is going related to arm function, pain, and swelling.  PAIN:  Are you having pain? No - she has some numbness on her right posterior upper arm.  PRECAUTIONS: Recent Surgery, right UE Lymphedema risk  RED FLAGS: None   ACTIVITY LEVEL / LEISURE: She goes to the gym 3-4x/week,  does 30 min of cardio and uses weight equipment. She also runs a chicken farm. Since surgery, she has just been walking.     OBJECTIVE:   OBSERVATIONS: Significant edema present right breast; see photo. Incisions have healed well with no signs of infection or redness.  POSTURE:  Forward head and rounded shoulders  LYMPHEDEMA ASSESSMENT:   UPPER EXTREMITY AROM/PROM:   A/PROM RIGHT   eval   RIGHT 10/01/2022  Shoulder extension 53 52  Shoulder flexion 151 167  Shoulder abduction 164 172  Shoulder internal rotation 60 73  Shoulder external rotation 80 90                          (Blank rows = not tested)   A/PROM LEFT   eval  Shoulder extension 50  Shoulder flexion 150  Shoulder abduction 174  Shoulder internal rotation 62  Shoulder external rotation 85                          (Blank rows = not tested)   CERVICAL AROM: All within normal limits   UPPER EXTREMITY STRENGTH: WNL   LYMPHEDEMA ASSESSMENTS:    LANDMARK RIGHT   eval RIGHT 10/01/2022  10 cm proximal to olecranon process 28.1 29.4  Olecranon process 24.2 24.3  10 cm proximal to ulnar styloid process 22.4 22.3  Just proximal to ulnar styloid process 15.1 15.2  Across hand at thumb web space 16.8 16.9  At base of 2nd digit 5.8 5.6  (Blank rows = not tested)   LANDMARK LEFT   eval LEFT 10/01/2022  10 cm proximal to olecranon process 27.8 28  Olecranon process 23.1 23.2  10 cm proximal to ulnar styloid process 21 21.4  Just proximal to ulnar styloid process 14.7 14.6  Across hand at thumb web space 16.7 16.7  At base of 2nd digit 5.9 5.8  (Blank rows = not tested)    Surgery type/Date: 08/20/2022 right lumpectomy and sentinel node biopsy Number of lymph nodes removed: 5 Current/past treatment (chemo, radiation, hormone therapy): none Other symptoms:  Heaviness/tightness No Pain No Pitting edema No Infections No Decreased scar mobility No Stemmer sign No   TREATMENT 11/05/22 Manual  Therapy MLD: In supine: Short neck, superficial and deep abdominals, Lt axillary nodes, Lt anterior intact upper quadrant sequence, establishment of anterior interaxillary pathway, R inguinal nodes and establishment of Rt axilloinguinal pathway, then Rt breast moving fluid  towards pathways with more gentle pressure and avoiding red/open area of inferior breast. Unable to lay sidelying today then retracing all steps in supine.  P/ROM to Rt shoulder into flexion, abd and D2 with scapular depression throughout  10/26/22 Manual Therapy MLD: In supine: Short neck, superficial and deep abdominals, Lt axillary nodes, Lt anterior intact upper quadrant sequence, establishment of anterior interaxillary pathway, R inguinal nodes and establishment of Rt axilloinguinal pathway, then Rt breast moving fluid towards pathways spending extra time in any areas of fibrosis, Lt S/L for further focus to lateral breast redirecting towards lateral anastomosis, then finished retracing all steps in supine.  P/ROM to Rt shoulder into flexion, abd and D2 with scapular depression throughout  10/23/22: Manual Therapy MLD: In supine: Short neck, superficial and deep abdominals, Lt axillary nodes, Lt anterior intact upper quadrant sequence, establishment of anterior interaxillary pathway, R inguinal nodes and establishment of Rt axilloinguinal pathway, then Rt breast moving fluid towards pathways spending extra time in any areas of fibrosis, Lt S/L for further focus to lateral breast redirecting towards lateral anastomosis, then finished retracing all steps in supine.  P/ROM to Rt shoulder into flexion, abd and D2 with scapular depression throughout  10/16/22 MLD: In supine: Short neck, superficial abdominals and 5 diaphragmatic breaths, L axillary nodes, establishment of anterior interaxillary pathway, R inguinal nodes and establishment of Rt axilloinguinal pathway, then Rt breast moving fluid towards pathways spending extra time in any  areas of fibrosis, then retracing all steps.  P/ROM to Rt shoulder into flexion, abd and D2 with scapular depression throughout  10/12/22: Manual Therapy MLD: In supine: Short neck, superficial abdominals and 5 diaphragmatic breaths, L axillary nodes, Lt intact anterior upper quadrant sequence and establishment of anterior interaxillary pathway, R inguinal nodes and establishment of Rt axilloinguinal pathway, then Rt breast moving fluid towards pathways spending extra time in any areas of fibrosis, Lt S/L for further work to lateral and inferior breast spending extra time in areas of fibrosis palpated now at inferior breast and then finished retracing all steps in supine. Reviewed technique with pt. Tactile and VC's to decrease pressure but she was applying a correct skin stretch. P/ROM to Rt shoulder into flexion, abd and D2 with scapular depression throughout STM to Rt lateral trunk where pt palpably tight which seems to limit her end P/ROM  10/10/22: Manual Therapy In supine: Short neck, superficial abdominals and 5 diaphragmatic breaths, L axillary nodes, Lt intact anterior upper quadrant sequence and establishment of anterior interaxillary pathway, R inguinal nodes and establishment of Rt axilloinguinal pathway, then R breast moving fluid towards pathways spending extra time in any areas of fibrosis, Lt S/L for further work to lateral and inferior breast and then finished retracing all steps in supine.  10/05/2022 In supine: Short neck, 5 diaphragmatic breaths, L axillary nodes and establishment of interaxillary pathway, R inguinal nodes and establishment of axilloinguinal pathway, then R breast moving fluid towards pathways spending extra time in any areas of fibrosis then retracing all steps. Pt was educated in superficial nature of technique importance of stretch and performed all aspects of MLD to the right breast after therapist performed all. She was given illustrated and written  instructions.     PATIENT EDUCATION:  Education details: HEP and lymphedema risk reduction Person educated: Patient Education method: Explanation Education comprehension: verbalized understanding  HOME EXERCISE PROGRAM: Reviewed previously given post op HEP.   ASSESSMENT:  CLINICAL IMPRESSION: Pt continues to report her Rt breast feeling improved with PT  sessions. Her skin is becoming redder in appearance and now a bit open on the inferior breast though so advised her that if she starts having increased sensitivity we may need to place her on hold until completing radiation. But we will cont to monitor her skin for this.   Pt will benefit from skilled therapeutic intervention to improve on the following deficits: Decreased knowledge of precautions, impaired UE functional use, pain, decreased ROM, postural dysfunction.   PT treatment/interventions: ADL/Self care home management, Therapeutic exercises, Therapeutic activity, Patient/Family education, Self Care, Manual lymph drainage, Manual therapy, and Re-evaluation   GOALS: Goals reviewed with patient? Yes  LONG TERM GOALS:  (STG=LTG)  GOALS Name Target Date  Goal status  1 Pt will demonstrate she has regained full shoulder ROM and function post operatively compared to baselines.  Baseline: 10/29/2022 IN PROGRESS  2 Patient will report/demonstrate a >/= 50% reduction in right breast edema 10/29/2022 INITIAL  3 Patient will verbalize good understanding of performing self manual lymph drainage for right breast swelling. 10/29/2022 INITIAL     PLAN:  PT FREQUENCY/DURATION: 2x/week for 4 weeks  PLAN FOR NEXT SESSION: Manual lymph drainage right breast, and review with pt prn. Also cont end P/ROM stretching to help decrease tightness from radiation.    [facility]  [address], [address]  [place] [address]  [phone]   Idamae Lusher, PT 11/05/2022, 10:50 AM

## 2022-11-05 NOTE — Progress Notes (Signed)
[facility] CONSULT NOTE  Patient Care Team: Hamrick, Durward Fortes, MD as PCP - General (Family Medicine) Quintella Reichert, MD as PCP - Cardiology (Cardiology) Antony Blackbird, MD as Consulting Physician (Radiation Oncology) Rachel Moulds, MD as Consulting Physician (Hematology and Oncology) Griselda Miner, MD as Consulting Physician (General Surgery) Donnelly Angelica, RN as Oncology Nurse Navigator Pershing Proud, RN as Oncology Nurse Navigator  CHIEF COMPLAINTS/PURPOSE OF CONSULTATION:  Newly diagnosed breast cancer  HISTORY OF PRESENTING ILLNESS:  [name] 47 y.o. female is here because of recent diagnosis of right breast mass  I reviewed her records extensively and collaborated the history with the patient.  SUMMARY OF ONCOLOGIC HISTORY: Oncology History  Malignant neoplasm of lower-outer quadrant of right breast of female, estrogen receptor positive (HCC)  07/27/2022 Mammogram   Patient had screening mammogram recall, underwent diagnostic mammogram, this showed suspicious 4 mm lower right breast mass. No abnormal appearing right axillary nodes.   07/31/2022 Pathology Results   Right breast needle core biopsy showed IDC grade 2, ER 90% pos, strong staining, PR 95% positive strong staining, Her 2 neg.   08/06/2022 Initial Diagnosis   Malignant neoplasm of lower-outer quadrant of right breast of female, estrogen receptor positive (HCC)    Genetic Testing   [facility] Common Cancer Panel+RNA was Negative. Report date is 08/17/2022.  The Common Hereditary Cancers Panel offered by [facility] includes sequencing and/or deletion duplication testing of the following 48 genes: APC, ATM, AXIN2, BAP1, BARD1, BMPR1A, BRCA1, BRCA2, BRIP1, CDH1, CDK4, CDKN2A (p14ARF and p16INK4a only), CHEK2, CTNNA1, DICER1, EPCAM (Deletion/duplication testing only), FH, GREM1 (promoter region duplication testing only), HOXB13, KIT, MBD4, MEN1, MLH1, MSH2, MSH3, MSH6, MUTYH, NF1, NHTL1, PALB2, PDGFRA,  PMS2, POLD1, POLE, PTEN, RAD51C, RAD51D, SDHA (sequencing analysis only except exon 14), SDHB, SDHC, SDHD, SMAD4, SMARCA4. STK11, TP53, TSC1, TSC2, and VHL.    This is a pleasant 47 year old female patient with past medical history significant for anxiety on medication, otherwise healthy referred to breast MDC for recommendations regarding her newly diagnosed right breast invasive ductal carcinoma.  She arrived today to the appointment with her husband.  She denies any family history of breast cancer.  She has 2 children, age at first childbirth of 43, breast-fed for a few weeks.  No history of prolonged use of birth control.  She denies any changes in the breast prior to this mammogram.  She was on Ozempic for about 8 months this year for weight loss.  Discussed the use of AI scribe software for clinical note transcription with the patient, who gave verbal consent to proceed.  History of Present Illness    The patient, with a recent history of breast cancer and radiation therapy, is being considered for tamoxifen therapy. The patient is premenopausal, which limits the choice of hormonal therapy to tamoxifen. She is doing well with radiation  except for redness and peeling in the inframammary region. She will complete it in about a week.     MEDICAL HISTORY:  Past Medical History:  Diagnosis Date   Abnormal Pap smear    Anxiety    Breast cancer (HCC)    Breast mass    left   Depression    GERD (gastroesophageal reflux disease)    Headache(784.0)    migraines   History of miscarriage    Hypertension    Incomplete RBBB    Palpitations    Syncope and collapse    Tachycardia    Vaginal Pap smear, abnormal  SURGICAL HISTORY: Past Surgical History:  Procedure Laterality Date   BREAST BIOPSY Right 07/31/2022   [place] RT BREAST BX W LOC DEV 1ST LESION IMG BX SPEC US GUIDE 07/31/2022 [facility] MAMMOGRAPHY   BREAST BIOPSY  08/16/2022   MM RT RADIOACTIVE SEED LOC MAMMO GUIDE 08/16/2022 [facility]  MAMMOGRAPHY   BREAST ENHANCEMENT SURGERY  2012   BREAST LUMPECTOMY WITH RADIOACTIVE SEED AND SENTINEL LYMPH NODE BIOPSY Right 08/20/2022   Procedure: RIGHT BREAST LUMPECTOMY WITH RADIOACTIVE SEED AND SENTINEL LYMPH NODE BIOPSY;  Surgeon: [name], MD;  Location: [facility];  Service: General;  Laterality: Right;  PEC BLOCK   CHOLECYSTECTOMY     DILATION AND CURETTAGE OF UTERUS  2008   LIPOMA EXCISION  2010   left leg   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   US ECHOCARDIOGRAPHY  06/17/2007   EF 55-60%    SOCIAL HISTORY: Social History   Socioeconomic History   Marital status: Married    Spouse name: Not on file   Number of children: 1   Years of education: Not on file   Highest education level: Not on file  Occupational History    Employer: UNEMPLOYED  Tobacco Use   Smoking status: Former    Current packs/day: 0.25    Types: Cigarettes   Smokeless tobacco: Never  Vaping Use   Vaping status: Never Used  Substance and Sexual Activity   Alcohol use: No   Drug use: No   Sexual activity: Yes    Birth control/protection: None  Other Topics Concern   Not on file  Social History Narrative   Not on file   Social Determinants of Health   Financial Resource Strain: Not on file  Food Insecurity: No Food Insecurity (08/08/2022)   Hunger Vital Sign    Worried About Running Out of Food in the Last Year: Never true    Ran Out of Food in the Last Year: Never true  Transportation Needs: No Transportation Needs (08/08/2022)   PRAPARE - Administrator, Civil Service (Medical): No    Lack of Transportation (Non-Medical): No  Physical Activity: Not on file  Stress: Not on file  Social Connections: Not on file  Intimate Partner Violence: Not At Risk (08/08/2022)   Humiliation, Afraid, Rape, and Kick questionnaire    Fear of Current or Ex-Partner: No    Emotionally Abused: No    Physically Abused: No    Sexually  Abused: No    FAMILY HISTORY: Family History  Problem Relation Age of Onset   Hypertension Mother    Hyperlipidemia Mother    Heart disease Mother    COPD Mother    Heart attack Father        X2   Heart disease Father    Hypertension Father    Colon cancer Father 18   Crohn's disease Brother    COPD Maternal Grandmother    Diabetes Maternal Grandmother    Stomach cancer Neg Hx    Esophageal cancer Neg Hx     ALLERGIES:  is allergic to amoxicillin, bactrim, erythromycin, and sulfa drugs cross reactors.  MEDICATIONS:  Current Outpatient Medications  Medication Sig Dispense Refill   tamoxifen (NOLVADEX) 20 MG tablet Take 1 tablet (20 mg total) by mouth daily. 90 tablet 3   Cholecalciferol (VITAMIN D) 125 MCG (5000 UT) CAPS Take by mouth.     escitalopram (LEXAPRO) 20 MG tablet Take 1 tablet by  mouth daily.     hydrochlorothiazide (HYDRODIURIL) 12.5 MG tablet Take 12.5 mg by mouth daily.     ibuprofen (ADVIL) 800 MG tablet Take 800 mg by mouth every 8 (eight) hours as needed.     LORazepam (ATIVAN) 1 MG tablet Take 1 mg by mouth as directed.     omeprazole (PRILOSEC) 20 MG capsule Take 20 mg by mouth daily.     oxyCODONE (ROXICODONE) 5 MG immediate release tablet Take 1 tablet (5 mg total) by mouth every 6 (six) hours as needed for severe pain. 10 tablet 0   propranolol (INDERAL) 20 MG tablet Take 20 mg by mouth 2 (two) times daily.     No current facility-administered medications for this visit.    REVIEW OF SYSTEMS:   Constitutional: Denies fevers, chills or abnormal night sweats Eyes: Denies blurriness of vision, double vision or watery eyes Ears, nose, mouth, throat, and face: Denies mucositis or sore throat Respiratory: Denies cough, dyspnea or wheezes Cardiovascular: Denies palpitation, chest discomfort or lower extremity swelling Gastrointestinal:  Denies nausea, heartburn or change in bowel habits Skin: Denies abnormal skin rashes Lymphatics: Denies new  lymphadenopathy or easy bruising Neurological:Denies numbness, tingling or new weaknesses Behavioral/Psych: Mood is stable, no new changes  Breast: Denies any palpable lumps or discharge All other systems were reviewed with the patient and are negative.  PHYSICAL EXAMINATION: ECOG PERFORMANCE STATUS: 0 - Asymptomatic  Vitals:   11/05/22 1149  BP: (!) 126/91  Pulse: 80  Resp: 18  Temp: (!) 97.5 F (36.4 C)  SpO2: 100%   Filed Weights   11/05/22 1149  Weight: 169 lb 4.8 oz (76.8 kg)    GENERAL:alert, no distress and comfortable Right breast post rad changes.  LABORATORY DATA:  I have reviewed the data as listed Lab Results  Component Value Date   WBC 8.1 08/08/2022   HGB 15.9 (H) 08/08/2022   HCT 44.3 08/08/2022   MCV 90.4 08/08/2022   PLT 391 08/08/2022   Lab Results  Component Value Date   NA 137 08/08/2022   K 3.8 08/08/2022   CL 99 08/08/2022   CO2 31 08/08/2022    RADIOGRAPHIC STUDIES: I have personally reviewed the radiological reports and agreed with the findings in the report.  ASSESSMENT AND PLAN:  Malignant neoplasm of lower-outer quadrant of right breast of female, estrogen receptor positive (HCC) This is a pleasant 47 year old premenopausal female patient with newly diagnosed right breast grade 2 IDC, ER/PR positive HER2 negative, low proliferation index, grade 2 DCIS referred to breast MDC for additional recommendations.  Given the tumor size under 5 mm and strong ER/PR positivity, she will proceed with upfront surgery.  If the final pathology shows tumor measuring over 5 mm, we can certainly consider Oncotype DX testing and I discussed the following details about Oncotype DX testing today. Oncotype of 15, hence no adj chemo. She is now getting ready to be done with anti estrogen therapy.  Breast Cancer Premenopausal status necessitates the use of Tamoxifen, a selective estrogen receptor modulator, for adjuvant therapy. Discussed the mechanism of action,  potential side effects including but not limited to menopausal symptoms, mood swings, slowed metabolism, risk of blood clots, and endometrial hyperplasia, endometrial cancer and benefits including improved bone density. Family history of blood clots noted. We discussed symptoms and signs of blood clots. -Start Tamoxifen 1mg  daily from October 1st, 2024 for a duration of 5 years. -[facility] in [place]. -Plan for follow-up in 3-3.5  months to assess tolerance to Tamoxifen and SCP visit.  Radiation Therapy Patient is nearing the end of radiation therapy with noted skin changes and tenderness. -Continue current skin care regimen. -Plan for follow-up with surgeon in November.  General Health Maintenance / Followup Plans -Annual blood work may be drawn, not mandatory. -Continue regular follow-ups with the survivorship clinic after the initial visit.  Total time spent: 30 min All questions were answered. The patient knows to call the clinic with any problems, questions or concerns.    Rachel Moulds, MD 11/05/22

## 2022-11-05 NOTE — Assessment & Plan Note (Signed)
This is a pleasant 47 year old premenopausal female patient with newly diagnosed right breast grade 2 IDC, ER/PR positive HER2 negative, low proliferation index, grade 2 DCIS referred to breast MDC for additional recommendations.  Given the tumor size under 5 mm and strong ER/PR positivity, she will proceed with upfront surgery.  If the final pathology shows tumor measuring over 5 mm, we can certainly consider Oncotype DX testing and I discussed the following details about Oncotype DX testing today. Oncotype of 15, hence no adj chemo. She is now getting ready to be done with anti estrogen therapy.  Breast Cancer Premenopausal status necessitates the use of Tamoxifen, a selective estrogen receptor modulator, for adjuvant therapy. Discussed the mechanism of action, potential side effects including but not limited to menopausal symptoms, mood swings, slowed metabolism, risk of blood clots, and endometrial hyperplasia, endometrial cancer and benefits including improved bone density. Family history of blood clots noted. We discussed symptoms and signs of blood clots. -Start Tamoxifen 1mg  daily from October 1st, 2024 for a duration of 5 years. -[facility] [place]. -Plan for follow-up in 3-3.5 months to assess tolerance to Tamoxifen and SCP visit.  Radiation Therapy Patient is nearing the end of radiation therapy with noted skin changes and tenderness. -Continue current skin care regimen. -Plan for follow-up with surgeon in November.  General Health Maintenance / Followup Plans -Annual blood work may be drawn, not mandatory. -Continue regular follow-ups with the survivorship clinic after the initial visit.

## 2022-11-06 ENCOUNTER — Ambulatory Visit
Admission: RE | Admit: 2022-11-06 | Discharge: 2022-11-06 | Disposition: A | Discharge status: Home / Self Care | Payer: 59 | Source: Ambulatory Visit | Attending: Radiation Oncology | Admitting: Radiation Oncology

## 2022-11-06 ENCOUNTER — Other Ambulatory Visit: Payer: Self-pay

## 2022-11-06 DIAGNOSIS — N912 Amenorrhea, unspecified: Secondary | ICD-10-CM | POA: Diagnosis not present

## 2022-11-06 DIAGNOSIS — Z79899 Other long term (current) drug therapy: Secondary | ICD-10-CM | POA: Diagnosis not present

## 2022-11-06 DIAGNOSIS — Z79810 Long term (current) use of selective estrogen receptor modulators (SERMs): Secondary | ICD-10-CM | POA: Diagnosis not present

## 2022-11-06 DIAGNOSIS — Z170 Estrogen receptor positive status [ER+]: Secondary | ICD-10-CM | POA: Diagnosis not present

## 2022-11-06 DIAGNOSIS — I10 Essential (primary) hypertension: Secondary | ICD-10-CM | POA: Diagnosis not present

## 2022-11-06 DIAGNOSIS — Z510 Encounter for antineoplastic radiation therapy: Secondary | ICD-10-CM | POA: Diagnosis not present

## 2022-11-06 DIAGNOSIS — C50511 Malignant neoplasm of lower-outer quadrant of right female breast: Secondary | ICD-10-CM | POA: Diagnosis not present

## 2022-11-06 DIAGNOSIS — K219 Gastro-esophageal reflux disease without esophagitis: Secondary | ICD-10-CM | POA: Diagnosis not present

## 2022-11-06 LAB — RAD ONC ARIA SESSION SUMMARY
Course Elapsed Days: 36
Plan Fractions Treated to Date: 26
Plan Prescribed Dose Per Fraction: 1.8 Gy
Plan Total Fractions Prescribed: 28
Plan Total Prescribed Dose: 50.4 Gy
Reference Point Dosage Given to Date: 46.8 Gy
Reference Point Session Dosage Given: 1.8 Gy
Session Number: 26

## 2022-11-07 ENCOUNTER — Ambulatory Visit: Payer: 59

## 2022-11-07 ENCOUNTER — Other Ambulatory Visit: Payer: Self-pay

## 2022-11-07 DIAGNOSIS — C50511 Malignant neoplasm of lower-outer quadrant of right female breast: Secondary | ICD-10-CM | POA: Diagnosis not present

## 2022-11-07 DIAGNOSIS — K219 Gastro-esophageal reflux disease without esophagitis: Secondary | ICD-10-CM | POA: Diagnosis not present

## 2022-11-07 DIAGNOSIS — Z79899 Other long term (current) drug therapy: Secondary | ICD-10-CM | POA: Diagnosis not present

## 2022-11-07 DIAGNOSIS — Z170 Estrogen receptor positive status [ER+]: Secondary | ICD-10-CM | POA: Diagnosis not present

## 2022-11-07 DIAGNOSIS — Z510 Encounter for antineoplastic radiation therapy: Secondary | ICD-10-CM | POA: Diagnosis not present

## 2022-11-07 DIAGNOSIS — N912 Amenorrhea, unspecified: Secondary | ICD-10-CM | POA: Diagnosis not present

## 2022-11-07 DIAGNOSIS — I10 Essential (primary) hypertension: Secondary | ICD-10-CM | POA: Diagnosis not present

## 2022-11-07 DIAGNOSIS — Z79810 Long term (current) use of selective estrogen receptor modulators (SERMs): Secondary | ICD-10-CM | POA: Diagnosis not present

## 2022-11-07 LAB — RAD ONC ARIA SESSION SUMMARY
Course Elapsed Days: 37
Plan Fractions Treated to Date: 27
Plan Prescribed Dose Per Fraction: 1.8 Gy
Plan Total Fractions Prescribed: 28
Plan Total Prescribed Dose: 50.4 Gy
Reference Point Dosage Given to Date: 48.6 Gy
Reference Point Session Dosage Given: 1.8 Gy
Session Number: 27

## 2022-11-08 ENCOUNTER — Ambulatory Visit
Admission: RE | Admit: 2022-11-08 | Discharge: 2022-11-08 | Disposition: A | Discharge status: Home / Self Care | Payer: 59 | Source: Ambulatory Visit | Attending: Radiation Oncology | Admitting: Radiation Oncology

## 2022-11-08 ENCOUNTER — Other Ambulatory Visit: Payer: Self-pay

## 2022-11-08 ENCOUNTER — Ambulatory Visit: Payer: 59

## 2022-11-08 DIAGNOSIS — Z79810 Long term (current) use of selective estrogen receptor modulators (SERMs): Secondary | ICD-10-CM | POA: Diagnosis not present

## 2022-11-08 DIAGNOSIS — Z170 Estrogen receptor positive status [ER+]: Secondary | ICD-10-CM | POA: Diagnosis not present

## 2022-11-08 DIAGNOSIS — Z510 Encounter for antineoplastic radiation therapy: Secondary | ICD-10-CM | POA: Diagnosis not present

## 2022-11-08 DIAGNOSIS — K219 Gastro-esophageal reflux disease without esophagitis: Secondary | ICD-10-CM | POA: Diagnosis not present

## 2022-11-08 DIAGNOSIS — I10 Essential (primary) hypertension: Secondary | ICD-10-CM | POA: Diagnosis not present

## 2022-11-08 DIAGNOSIS — Z79899 Other long term (current) drug therapy: Secondary | ICD-10-CM | POA: Diagnosis not present

## 2022-11-08 DIAGNOSIS — C50511 Malignant neoplasm of lower-outer quadrant of right female breast: Secondary | ICD-10-CM | POA: Diagnosis not present

## 2022-11-08 LAB — RAD ONC ARIA SESSION SUMMARY
Course Elapsed Days: 38
Plan Fractions Treated to Date: 28
Plan Prescribed Dose Per Fraction: 1.8 Gy
Plan Total Fractions Prescribed: 28
Plan Total Prescribed Dose: 50.4 Gy
Reference Point Dosage Given to Date: 50.4 Gy
Reference Point Session Dosage Given: 1.8 Gy
Session Number: 28

## 2022-11-09 ENCOUNTER — Other Ambulatory Visit: Payer: Self-pay

## 2022-11-09 ENCOUNTER — Ambulatory Visit: Payer: 59

## 2022-11-09 DIAGNOSIS — I10 Essential (primary) hypertension: Secondary | ICD-10-CM | POA: Diagnosis not present

## 2022-11-09 DIAGNOSIS — C50511 Malignant neoplasm of lower-outer quadrant of right female breast: Secondary | ICD-10-CM | POA: Diagnosis not present

## 2022-11-09 DIAGNOSIS — Z170 Estrogen receptor positive status [ER+]: Secondary | ICD-10-CM | POA: Diagnosis not present

## 2022-11-09 DIAGNOSIS — Z510 Encounter for antineoplastic radiation therapy: Secondary | ICD-10-CM | POA: Diagnosis not present

## 2022-11-09 DIAGNOSIS — Z79899 Other long term (current) drug therapy: Secondary | ICD-10-CM | POA: Diagnosis not present

## 2022-11-09 DIAGNOSIS — K219 Gastro-esophageal reflux disease without esophagitis: Secondary | ICD-10-CM | POA: Diagnosis not present

## 2022-11-09 DIAGNOSIS — Z79810 Long term (current) use of selective estrogen receptor modulators (SERMs): Secondary | ICD-10-CM | POA: Diagnosis not present

## 2022-11-09 LAB — RAD ONC ARIA SESSION SUMMARY
Course Elapsed Days: 39
Plan Fractions Treated to Date: 1
Plan Prescribed Dose Per Fraction: 2 Gy
Plan Total Fractions Prescribed: 5
Plan Total Prescribed Dose: 10 Gy
Reference Point Dosage Given to Date: 2 Gy
Reference Point Session Dosage Given: 2 Gy
Session Number: 29

## 2022-11-12 ENCOUNTER — Ambulatory Visit
Admission: RE | Admit: 2022-11-12 | Discharge: 2022-11-12 | Disposition: A | Discharge status: Home / Self Care | Payer: 59 | Source: Ambulatory Visit | Attending: Radiation Oncology | Admitting: Radiation Oncology

## 2022-11-12 ENCOUNTER — Ambulatory Visit: Payer: 59 | Admitting: Rehabilitation

## 2022-11-12 ENCOUNTER — Encounter: Payer: 59 | Admitting: Rehabilitation

## 2022-11-12 ENCOUNTER — Other Ambulatory Visit: Payer: Self-pay

## 2022-11-12 DIAGNOSIS — Z79810 Long term (current) use of selective estrogen receptor modulators (SERMs): Secondary | ICD-10-CM | POA: Diagnosis not present

## 2022-11-12 DIAGNOSIS — Z79899 Other long term (current) drug therapy: Secondary | ICD-10-CM | POA: Diagnosis not present

## 2022-11-12 DIAGNOSIS — I10 Essential (primary) hypertension: Secondary | ICD-10-CM | POA: Diagnosis not present

## 2022-11-12 DIAGNOSIS — Z510 Encounter for antineoplastic radiation therapy: Secondary | ICD-10-CM | POA: Diagnosis not present

## 2022-11-12 DIAGNOSIS — K219 Gastro-esophageal reflux disease without esophagitis: Secondary | ICD-10-CM | POA: Diagnosis not present

## 2022-11-12 DIAGNOSIS — Z170 Estrogen receptor positive status [ER+]: Secondary | ICD-10-CM | POA: Diagnosis not present

## 2022-11-12 DIAGNOSIS — C50511 Malignant neoplasm of lower-outer quadrant of right female breast: Secondary | ICD-10-CM | POA: Diagnosis not present

## 2022-11-12 LAB — RAD ONC ARIA SESSION SUMMARY
Course Elapsed Days: 42
Plan Fractions Treated to Date: 2
Plan Prescribed Dose Per Fraction: 2 Gy
Plan Total Fractions Prescribed: 5
Plan Total Prescribed Dose: 10 Gy
Reference Point Dosage Given to Date: 4 Gy
Reference Point Session Dosage Given: 2 Gy
Session Number: 30

## 2022-11-13 ENCOUNTER — Other Ambulatory Visit: Payer: Self-pay

## 2022-11-13 ENCOUNTER — Ambulatory Visit: Payer: 59

## 2022-11-13 ENCOUNTER — Ambulatory Visit
Admission: RE | Admit: 2022-11-13 | Discharge: 2022-11-13 | Disposition: A | Discharge status: Home / Self Care | Payer: 59 | Source: Ambulatory Visit | Attending: Radiation Oncology | Admitting: Radiation Oncology

## 2022-11-13 DIAGNOSIS — Z79899 Other long term (current) drug therapy: Secondary | ICD-10-CM | POA: Diagnosis not present

## 2022-11-13 DIAGNOSIS — C50511 Malignant neoplasm of lower-outer quadrant of right female breast: Secondary | ICD-10-CM | POA: Diagnosis not present

## 2022-11-13 DIAGNOSIS — Z79810 Long term (current) use of selective estrogen receptor modulators (SERMs): Secondary | ICD-10-CM | POA: Diagnosis not present

## 2022-11-13 DIAGNOSIS — I10 Essential (primary) hypertension: Secondary | ICD-10-CM | POA: Diagnosis not present

## 2022-11-13 DIAGNOSIS — Z170 Estrogen receptor positive status [ER+]: Secondary | ICD-10-CM | POA: Diagnosis not present

## 2022-11-13 DIAGNOSIS — Z510 Encounter for antineoplastic radiation therapy: Secondary | ICD-10-CM | POA: Diagnosis not present

## 2022-11-13 DIAGNOSIS — K219 Gastro-esophageal reflux disease without esophagitis: Secondary | ICD-10-CM | POA: Diagnosis not present

## 2022-11-13 LAB — RAD ONC ARIA SESSION SUMMARY
Course Elapsed Days: 43
Plan Fractions Treated to Date: 3
Plan Prescribed Dose Per Fraction: 2 Gy
Plan Total Fractions Prescribed: 5
Plan Total Prescribed Dose: 10 Gy
Reference Point Dosage Given to Date: 6 Gy
Reference Point Session Dosage Given: 2 Gy
Session Number: 31

## 2022-11-14 ENCOUNTER — Ambulatory Visit: Payer: 59

## 2022-11-14 ENCOUNTER — Ambulatory Visit
Admission: RE | Admit: 2022-11-14 | Discharge: 2022-11-14 | Disposition: A | Discharge status: Home / Self Care | Payer: 59 | Source: Ambulatory Visit | Attending: Radiation Oncology | Admitting: Radiation Oncology

## 2022-11-14 ENCOUNTER — Other Ambulatory Visit: Payer: Self-pay

## 2022-11-14 DIAGNOSIS — K219 Gastro-esophageal reflux disease without esophagitis: Secondary | ICD-10-CM | POA: Diagnosis not present

## 2022-11-14 DIAGNOSIS — Z79810 Long term (current) use of selective estrogen receptor modulators (SERMs): Secondary | ICD-10-CM | POA: Diagnosis not present

## 2022-11-14 DIAGNOSIS — I10 Essential (primary) hypertension: Secondary | ICD-10-CM | POA: Diagnosis not present

## 2022-11-14 DIAGNOSIS — Z170 Estrogen receptor positive status [ER+]: Secondary | ICD-10-CM | POA: Diagnosis not present

## 2022-11-14 DIAGNOSIS — Z79899 Other long term (current) drug therapy: Secondary | ICD-10-CM | POA: Diagnosis not present

## 2022-11-14 DIAGNOSIS — C50511 Malignant neoplasm of lower-outer quadrant of right female breast: Secondary | ICD-10-CM | POA: Diagnosis not present

## 2022-11-14 DIAGNOSIS — Z510 Encounter for antineoplastic radiation therapy: Secondary | ICD-10-CM | POA: Diagnosis not present

## 2022-11-14 LAB — RAD ONC ARIA SESSION SUMMARY
Course Elapsed Days: 44
Plan Fractions Treated to Date: 4
Plan Prescribed Dose Per Fraction: 2 Gy
Plan Total Fractions Prescribed: 5
Plan Total Prescribed Dose: 10 Gy
Reference Point Dosage Given to Date: 8 Gy
Reference Point Session Dosage Given: 2 Gy
Session Number: 32

## 2022-11-15 ENCOUNTER — Ambulatory Visit: Payer: 59 | Admitting: Physical Therapy

## 2022-11-15 ENCOUNTER — Ambulatory Visit: Payer: Self-pay | Admitting: Rehabilitation

## 2022-11-15 ENCOUNTER — Other Ambulatory Visit: Payer: Self-pay

## 2022-11-15 ENCOUNTER — Ambulatory Visit
Admission: RE | Admit: 2022-11-15 | Discharge: 2022-11-15 | Disposition: A | Discharge status: Home / Self Care | Payer: 59 | Source: Ambulatory Visit | Attending: Radiation Oncology | Admitting: Radiation Oncology

## 2022-11-15 ENCOUNTER — Ambulatory Visit: Payer: 59 | Admitting: Rehabilitation

## 2022-11-15 DIAGNOSIS — Z79899 Other long term (current) drug therapy: Secondary | ICD-10-CM | POA: Diagnosis not present

## 2022-11-15 DIAGNOSIS — C50511 Malignant neoplasm of lower-outer quadrant of right female breast: Secondary | ICD-10-CM | POA: Diagnosis not present

## 2022-11-15 DIAGNOSIS — Z510 Encounter for antineoplastic radiation therapy: Secondary | ICD-10-CM | POA: Diagnosis not present

## 2022-11-15 DIAGNOSIS — Z170 Estrogen receptor positive status [ER+]: Secondary | ICD-10-CM | POA: Diagnosis not present

## 2022-11-15 DIAGNOSIS — K219 Gastro-esophageal reflux disease without esophagitis: Secondary | ICD-10-CM | POA: Diagnosis not present

## 2022-11-15 DIAGNOSIS — Z79810 Long term (current) use of selective estrogen receptor modulators (SERMs): Secondary | ICD-10-CM | POA: Diagnosis not present

## 2022-11-15 DIAGNOSIS — I10 Essential (primary) hypertension: Secondary | ICD-10-CM | POA: Diagnosis not present

## 2022-11-15 LAB — RAD ONC ARIA SESSION SUMMARY
Course Elapsed Days: 45
Plan Fractions Treated to Date: 5
Plan Prescribed Dose Per Fraction: 2 Gy
Plan Total Fractions Prescribed: 5
Plan Total Prescribed Dose: 10 Gy
Reference Point Dosage Given to Date: 10 Gy
Reference Point Session Dosage Given: 2 Gy
Session Number: 33

## 2022-11-15 NOTE — Therapy (Signed)
OUTPATIENT PHYSICAL THERAPY SOZO SCREENING NOTE   Patient Name: [name] MRN: [id number] DOB:[birth year], 47 y.o., female Today's Date: 11/15/2022  PCP: Ailene Ravel, MD REFERRING PROVIDER: Griselda Miner, MD   PT End of Session - 11/15/22 1305     Visit Number 9   unchanged due to screen only   PT Start Time 1300    PT Stop Time 1304    PT Time Calculation (min) 4 min    Activity Tolerance Patient tolerated treatment well    Behavior During Therapy WFL for tasks assessed/performed             Past Medical History:  Diagnosis Date   Abnormal Pap smear    Anxiety    Breast cancer (HCC)    Breast mass    left   Depression    GERD (gastroesophageal reflux disease)    Headache(784.0)    migraines   History of miscarriage    Hypertension    Incomplete RBBB    Palpitations    Syncope and collapse    Tachycardia    Vaginal Pap smear, abnormal    Past Surgical History:  Procedure Laterality Date   BREAST BIOPSY Right 07/31/2022   [place] RT BREAST BX W LOC DEV 1ST LESION IMG BX SPEC US GUIDE 07/31/2022 [facility]MO[facility]   BREAST BIOPSY  08/16/2022   MM RT RADIOACTIVE SEED LOC MAMMO GUIDE 08/16/2022 [facility] MAMMOGRAPHY   BREAST ENHANCEMENT SURGERY  2012   BREAST LUMPECTOMY WITH RADIOACTIVE SEED AND SENTINEL LYMPH NODE BIOPSY Right 08/20/2022   Procedure: RIGHT BREAST LUMPECTOMY WITH RADIOACTIVE SEED AND SENTINEL LYMPH NODE BIOPSY;  Surgeon: Griselda Miner, MD;  Location: [facility];  Service: General;  Laterality: Right;  PEC BLOCK   CHOLECYSTECTOMY     DILATION AND CURETTAGE OF UTERUS  2008   LIPOMA EXCISION  2010   left leg   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   US ECHOCARDIOGRAPHY  06/17/2007   EF 55-60%   Patient Active Problem List   Diagnosis Date Noted   Genetic testing 08/16/2022   Family history of colon cancer in father 08/08/2022   Malignant neoplasm of lower-outer quadrant of right breast of  female, estrogen receptor positive (HCC) 08/06/2022   Preterm premature rupture of membranes 06/03/2017   Advanced maternal age in multigravida 12/20/2016   Abnormal chromosomal and genetic finding on antenatal screening of mother 12/20/2016   [redacted] weeks gestation of pregnancy    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 She underwent a right lumpectomy and sentinel node biopsy (5 negative nodes) on 08/20/2022. It is ER/PR positive and HER2 negative with a Ki67 of 3%. She had breast implants placed in 2012.   PRECAUTIONS: right UE Lymphedema risk,   SUBJECTIVE: My skin is very irritated from radiation. I just completed it.   PAIN:  Are you having pain?  Not rated but pt reports increased skin sensitivity after completing radiation today  SOZO SCREENING: Patient was assessed today using the SOZO machine to determine the lymphedema index score. This was compared  to her baseline score. It was determined that she is within the recommended range when compared to her baseline and no further action is needed at this time. She will continue SOZO screenings. These are done every 3 months for 2 years post operatively followed by every 6 months for 2 years, and then annually.   [facility], PT 11/15/2022, 1:07 PM

## 2022-11-16 NOTE — Radiation Completion Notes (Signed)
Patient Name: [name], [name] MRN: [id number] Date of Birth: [birth year] Referring Physician: Burnell Blanks, M.D. Date of Service: 2022-11-16 Radiation Oncologist: Arnette Schaumann, M.D. [facility] [place]                             RADIATION ONCOLOGY END OF TREATMENT NOTE     Diagnosis: C50.511 Malignant neoplasm of lower-outer quadrant of right female breast Staging on 2022-08-08: Malignant neoplasm of lower-outer quadrant of right breast of female, estrogen receptor positive (HCC) T=cT1a, N=cN0, M=cM0 Intent: Curative     ==========DELIVERED PLANS==========  First Treatment Date: 2022-10-01 - Last Treatment Date: 2022-11-15   Plan Name: Breast_R Site: Breast, Right Technique: 3D Mode: Photon Dose Per Fraction: 1.8 Gy Prescribed Dose (Delivered / Prescribed): 50.4 Gy / 50.4 Gy Prescribed Fxs (Delivered / Prescribed): 28 / 28   Plan Name: Breast_R_Bst Site: Breast, Right Technique: 3D Mode: Photon Dose Per Fraction: 2 Gy Prescribed Dose (Delivered / Prescribed): 10 Gy / 10 Gy Prescribed Fxs (Delivered / Prescribed): 5 / 5     ==========ON TREATMENT VISIT DATES========== 2022-10-02, 2022-10-09, 2022-10-16, 2022-10-23, 2022-10-30, 2022-11-06, 2022-11-13     ==========UPCOMING VISITS==========       ==========APPENDIX - ON TREATMENT VISIT NOTES==========   See weekly On Treatment Notes in Epic for details.

## 2022-11-22 ENCOUNTER — Encounter: Payer: 59 | Admitting: Rehabilitation

## 2022-11-23 ENCOUNTER — Telehealth: Payer: Self-pay | Admitting: *Deleted

## 2022-11-23 NOTE — Telephone Encounter (Addendum)
This RN called pt to discuss MD's recommendation- pt states oddly enough she has started her period on her on without need for progesterone.  No further needs at this time.  ----- Message from [place] sent at 11/22/2022  2:44 PM EDT ----- If its just a temporary measure like a few days, this might be ok. If its long term, its not advised. ----- Message ----- From: Billey Co, RN Sent: 11/14/2022  10:17 AM EDT To: Rachel Moulds, MD  Called pt and obtained info below from Care Everywhere- pt is currently on tamoxifen-   11/07/2022 text/html HPI Notes: 47yo G3P2 seen 07-19-22 for AEX with mammogram here ultrasound follow up with c/o Patient stated that she has had no cycle in 3 months and only see blood with wiping and having cramping like she going to start her cycle . Patient has HX breast cancer. Pertinent medical/surgical history: H/o vitamin D deficiency, breast cancer survivor LMP: 07/13/22 Contraception: Vasectomy Labs: normal prolactin, FSH and thyroid panel Prior to May 2024, cycle was almost monthly. Previous h/o oligomenorrhea Silverio Lay, MD [address] [place] ----- Message ----- From: Rachel Moulds, MD Sent: 11/13/2022   8:09 AM EDT To: Billey Co, RN; [facility]  Val, can u look into this?  Thanks, ----- Message ----- From: Stefan Church, RN Sent: 11/12/2022   3:21 PM EDT To: Rachel Moulds, MD  Dr. Al Pimple-  Last visit w this pt 09/16. Looks like she started Tamoxifen 11/2022 and scheduled to be on it for 5 years.  Pt LVM stating that she recently visited her gynecologist and stated that "they suggested putting her on progesterone 200 mg to start her period" and states she hasn't had it in 3 months. I wanted to forward this to you because I do not know what to tell her.  Thanks!

## 2022-12-04 ENCOUNTER — Ambulatory Visit: Payer: 59 | Attending: General Surgery

## 2022-12-04 DIAGNOSIS — R293 Abnormal posture: Secondary | ICD-10-CM | POA: Insufficient documentation

## 2022-12-04 DIAGNOSIS — Z483 Aftercare following surgery for neoplasm: Secondary | ICD-10-CM | POA: Insufficient documentation

## 2022-12-04 DIAGNOSIS — Z170 Estrogen receptor positive status [ER+]: Secondary | ICD-10-CM | POA: Insufficient documentation

## 2022-12-04 DIAGNOSIS — C50511 Malignant neoplasm of lower-outer quadrant of right female breast: Secondary | ICD-10-CM | POA: Diagnosis present

## 2022-12-04 NOTE — Therapy (Signed)
OUTPATIENT PHYSICAL THERAPY BREAST CANCER TREATMENT   Patient Name: [name] MRN: [id number] DOB:[birth year], 47 y.o., female Today's Date: 12/04/2022  END OF SESSION:  PT End of Session - 12/04/22 0808     Visit Number 10    Number of Visits 16    Date for PT Re-Evaluation 11/23/22   D/C this visit   PT Start Time 0804    PT Stop Time 0902    PT Time Calculation (min) 58 min    Activity Tolerance Patient tolerated treatment well    Behavior During Therapy WFL for tasks assessed/performed             Past Medical History:  Diagnosis Date   Abnormal Pap smear    Anxiety    Breast cancer (HCC)    Breast mass    left   Depression    GERD (gastroesophageal reflux disease)    Headache(784.0)    migraines   History of miscarriage    Hypertension    Incomplete RBBB    Palpitations    Syncope and collapse    Tachycardia    Vaginal Pap smear, abnormal    Past Surgical History:  Procedure Laterality Date   BREAST BIOPSY Right 07/31/2022   [place] RT BREAST BX W LOC DEV 1ST LESION IMG BX SPEC US GUIDE 07/31/2022 [facility]   BREAST BIOPSY  08/16/2022   MM RT RADIOACTIVE SEED LOC MAMMO GUIDE 08/16/2022 [facility]   BREAST ENHANCEMENT SURGERY  2012   BREAST LUMPECTOMY WITH RADIOACTIVE SEED AND SENTINEL LYMPH NODE BIOPSY Right 08/20/2022   Procedure: RIGHT BREAST LUMPECTOMY WITH RADIOACTIVE SEED AND SENTINEL LYMPH NODE BIOPSY;  Surgeon: Griselda Miner, MD;  Location: [facility];  Service: General;  Laterality: Right;  PEC BLOCK   CHOLECYSTECTOMY     DILATION AND CURETTAGE OF UTERUS  2008   LIPOMA EXCISION  2010   left leg   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   US ECHOCARDIOGRAPHY  06/17/2007   EF 55-60%   Patient Active Problem List   Diagnosis Date Noted   Genetic testing 08/16/2022   Family history of colon cancer in father 08/08/2022   Malignant neoplasm of lower-outer quadrant of right  breast of female, estrogen receptor positive (HCC) 08/06/2022   Preterm premature rupture of membranes 06/03/2017   Advanced maternal age in multigravida 12/20/2016   Abnormal chromosomal and genetic finding on antenatal screening of mother 12/20/2016   [redacted] weeks gestation of pregnancy    Inappropriate sinus tachycardia (HCC) 03/17/2014   Pericardial effusion 03/17/2014   Headache    Anxiety    Palpitations    Tachycardia     REFERRING PROVIDER: Dr. Chevis Pretty  REFERRING DIAG: Right breast cancer  THERAPY DIAG:  Malignant neoplasm of lower-outer quadrant of right breast of female, estrogen receptor positive (HCC)  Aftercare following surgery for neoplasm  Abnormal posture  Rationale for Evaluation and Treatment: Rehabilitation  ONSET DATE: 08/20/2022  SUBJECTIVE:  SUBJECTIVE STATEMENT: My skin is looking so much better since completing radiation. I don't feel like I have to do the self MLD multiple times a day now. It's been about every other day. The knotty area in the inside of my breast is much better. My Rt shoulder motion is also doing great.     EVAL Patient reports she underwent a right lumpectomy and sentinel node biopsy (5 negative nodes) on 08/20/2022. Her Oncotype score was low so she will not need chemotherapy. She will proceed to radiation and anti-estrogen therapy.  Today is her first day of radiation.  PERTINENT HISTORY:  Patient was diagnosed on 07/19/2022 with right grade 2 invasive ductal carcinoma breast cancer. She underwent a right lumpectomy and sentinel node biopsy (5 negative nodes) on 08/20/2022. It is ER/PR positive and HER2 negative with a Ki67 of 3%. She had breast implants placed in 2012.   PATIENT GOALS:  Reassess how my recovery is going related to arm function, pain, and  swelling.  PAIN:  Are you having pain? No - numbness on back of arm is same  PRECAUTIONS: Recent Surgery, right UE Lymphedema risk  RED FLAGS: None   ACTIVITY LEVEL / LEISURE: She goes to the gym 3-4x/week, does 30 min of cardio and uses weight equipment. She also runs a chicken farm. Since surgery, she has just been walking.     OBJECTIVE:   OBSERVATIONS: Significant edema present right breast; see photo. Incisions have healed well with no signs of infection or redness.  POSTURE:  Forward head and rounded shoulders  LYMPHEDEMA ASSESSMENT:   UPPER EXTREMITY AROM/PROM:   A/PROM RIGHT   eval   RIGHT 10/01/2022 Right 12/04/22  Shoulder extension 53 52 59  Shoulder flexion 151 167 174  Shoulder abduction 164 172 181  Shoulder internal rotation 60 73 79  Shoulder external rotation 80 90                           (Blank rows = not tested)   A/PROM LEFT   eval  Shoulder extension 50  Shoulder flexion 150  Shoulder abduction 174  Shoulder internal rotation 62  Shoulder external rotation 85                          (Blank rows = not tested)   CERVICAL AROM: All within normal limits   UPPER EXTREMITY STRENGTH: WNL   LYMPHEDEMA ASSESSMENTS:    LANDMARK RIGHT   eval RIGHT 10/01/2022  10 cm proximal to olecranon process 28.1 29.4  Olecranon process 24.2 24.3  10 cm proximal to ulnar styloid process 22.4 22.3  Just proximal to ulnar styloid process 15.1 15.2  Across hand at thumb web space 16.8 16.9  At base of 2nd digit 5.8 5.6  (Blank rows = not tested)   LANDMARK LEFT   eval LEFT 10/01/2022  10 cm proximal to olecranon process 27.8 28  Olecranon process 23.1 23.2  10 cm proximal to ulnar styloid process 21 21.4  Just proximal to ulnar styloid process 14.7 14.6  Across hand at thumb web space 16.7 16.7  At base of 2nd digit 5.9 5.8  (Blank rows = not tested)    Surgery type/Date: 08/20/2022 right lumpectomy and sentinel node biopsy Number of lymph nodes  removed: 5 Current/past treatment (chemo, radiation, hormone therapy): none Other symptoms:  Heaviness/tightness No Pain No Pitting edema No Infections No Decreased scar mobility  No Stemmer sign No   TREATMENT 12/04/22: Manual Therapy MLD: In supine: Short neck, superficial and deep abdominals, Lt axillary nodes, Lt anterior intact upper quadrant sequence, establishment of anterior interaxillary pathway, R inguinal nodes and establishment of Rt axilloinguinal pathway, then Rt breast moving fluid towards pathways, then into Lt S/L for focus on lateral breast redirecting towards lateral anastomosis, then finished retracing all steps in supine. MFR to cording noticed today in Rt axilla and instructed pt in how to perform self MFR at home and explained what cording was. P/ROM to Rt shoulder into flexion, abd and D2 with scapular depression throughout A/ROM measurements taken for goal assess Cut and issued [employer] foam in stockinette (small dot) for pt to wear at inferior breast wear fibrosis palpated around incision  11/05/22 Manual Therapy MLD: In supine: Short neck, superficial and deep abdominals, Lt axillary nodes, Lt anterior intact upper quadrant sequence, establishment of anterior interaxillary pathway, R inguinal nodes and establishment of Rt axilloinguinal pathway, then Rt breast moving fluid towards pathways with more gentle pressure and avoiding red/open area of inferior breast. Unable to lay sidelying today then retracing all steps in supine.  P/ROM to Rt shoulder into flexion, abd and D2 with scapular depression throughout  10/26/22 Manual Therapy MLD: In supine: Short neck, superficial and deep abdominals, Lt axillary nodes, Lt anterior intact upper quadrant sequence, establishment of anterior interaxillary pathway, R inguinal nodes and establishment of Rt axilloinguinal pathway, then Rt breast moving fluid towards pathways spending extra time in any areas of fibrosis, Lt S/L for  further focus to lateral breast redirecting towards lateral anastomosis, then finished retracing all steps in supine.  P/ROM to Rt shoulder into flexion, abd and D2 with scapular depression throughout       PATIENT EDUCATION:  Education details: HEP and lymphedema risk reduction Person educated: Patient Education method: Explanation Education comprehension: verbalized understanding  HOME EXERCISE PROGRAM: Reviewed previously given post op HEP.   ASSESSMENT:  CLINICAL IMPRESSION: Pt has met all goals and is ready for D/C at this time. Compression foam issued for pt to wear at small area of fibrosis around incision to help this area decongest and pt was instructed how to perform self MFR on new area of cording. Ms. Bolerjack was a pleasure to work with and she knows she can return at any time if needed with a new MD referral.   Pt will benefit from skilled therapeutic intervention to improve on the following deficits: Decreased knowledge of precautions, impaired UE functional use, pain, decreased ROM, postural dysfunction.   PT treatment/interventions: ADL/Self care home management, Therapeutic exercises, Therapeutic activity, Patient/Family education, Self Care, Manual lymph drainage, Manual therapy, and Re-evaluation   GOALS: Goals reviewed with patient? Yes  LONG TERM GOALS:  (STG=LTG)  GOALS Name Target Date  Goal status  1 Pt will demonstrate she has regained full shoulder ROM and function post operatively compared to baselines.  Baseline: 10/29/2022 IN PROGRESS  2 Patient will report/demonstrate a >/= 50% reduction in right breast edema 10/29/2022 MET - 12/04/22:80-90% reported  3 Patient will verbalize good understanding of performing self manual lymph drainage for right breast swelling. 10/29/2022 MET     PLAN:  PT FREQUENCY/DURATION: 2x/week for 4 weeks  PLAN FOR NEXT SESSION: D/C this visit.    [facility]  [address]  [place]  [address]  [phone]   Hermenia Bers, PTA 12/04/2022, 9:49 AM

## 2022-12-06 ENCOUNTER — Encounter: Payer: 59 | Admitting: Rehabilitation

## 2022-12-11 ENCOUNTER — Encounter: Payer: 59 | Admitting: Rehabilitation

## 2022-12-12 DIAGNOSIS — R0981 Nasal congestion: Secondary | ICD-10-CM | POA: Diagnosis not present

## 2022-12-12 DIAGNOSIS — H9203 Otalgia, bilateral: Secondary | ICD-10-CM | POA: Diagnosis not present

## 2022-12-14 ENCOUNTER — Encounter: Payer: Self-pay | Admitting: Radiation Oncology

## 2022-12-16 NOTE — Progress Notes (Signed)
Radiation Oncology         [id number]) [phone] ________________________________  Name: [name] MRN: [id number]  Date: 12/17/2022  DOB: September 26, 1975  Follow-Up Visit Note  CC: Hamrick, Durward Fortes, MD  Hamrick, Durward Fortes, MD  No diagnosis found.  Diagnosis: Stage IA (cT1a, cN0, cM0) Right Breast LOQ, Invasive and in situ ductal carcinoma, ER+ / PR+ / Her2-, Grade 2: s/p lumpectomy and right axillary SLN biopsies      Interval Since Last Radiation: 1 month and 2 days   Indication for treatment: Curative       Radiation treatment dates: 10/01/22 through 11/15/22  Site/dose:   1) Right breast - 50.4 Gy delivered in 28 Fx at 1.8 Gy/Fx 2) Right breast boost - 10 Gy delivered in 5 Fx at 2 Gy/Fx Technique/Mode: 3D / Photon  Beams/energy: 6X-FFF  Narrative:  The patient returns today for routine follow-up. She tolerated radiation treatment relatively well. During her final weekly treatment check on 11/13/22, the patient endorsed fatigue and peeling beneath the right breast, to which she applied neosporin. Physical exam performed on that same date showed diffuse erythema throughout the right breast area and some hyperpigmentation changes. No moist desquamation was appreciated.          Since completing radiation therapy, she began antiestrogen therapy consisting of tamoxifen on 11/20/22. Tamoxifen was recommended by Dr. Al Pimple due to her premenopausal status.   ***                       Allergies:  is allergic to amoxicillin, bactrim, erythromycin, and sulfa drugs cross reactors.  Meds: Current Outpatient Medications  Medication Sig Dispense Refill   Cholecalciferol (VITAMIN D) 125 MCG (5000 UT) CAPS Take by mouth.     escitalopram (LEXAPRO) 20 MG tablet Take 1 tablet by mouth daily.     hydrochlorothiazide (HYDRODIURIL) 12.5 MG tablet Take 12.5 mg by mouth daily.     ibuprofen (ADVIL) 800 MG tablet Take 800 mg by mouth every 8 (eight) hours as needed.     LORazepam (ATIVAN) 1 MG tablet Take 1  mg by mouth as directed.     omeprazole (PRILOSEC) 20 MG capsule Take 20 mg by mouth daily.     oxyCODONE (ROXICODONE) 5 MG immediate release tablet Take 1 tablet (5 mg total) by mouth every 6 (six) hours as needed for severe pain. 10 tablet 0   propranolol (INDERAL) 20 MG tablet Take 20 mg by mouth 2 (two) times daily.     tamoxifen (NOLVADEX) 20 MG tablet Take 1 tablet (20 mg total) by mouth daily. 90 tablet 3   No current facility-administered medications for this encounter.    Physical Findings: The patient is in no acute distress. Patient is alert and oriented.  vitals were not taken for this visit. .  No significant changes. Lungs are clear to auscultation bilaterally. Heart has regular rate and rhythm. No palpable cervical, supraclavicular, or axillary adenopathy. Abdomen soft, non-tender, normal bowel sounds.  Left Breast: no palpable mass, nipple discharge or bleeding. Right Breast: ***  Lab Findings: Lab Results  Component Value Date   WBC 8.1 08/08/2022   HGB 15.9 (H) 08/08/2022   HCT 44.3 08/08/2022   MCV 90.4 08/08/2022   PLT 391 08/08/2022    Radiographic Findings: No results found.  Impression: Stage IA (cT1a, cN0, cM0) Right Breast LOQ, Invasive and in situ ductal carcinoma, ER+ / PR+ / Her2-, Grade 2: s/p lumpectomy and right axillary SLN  biopsies      The patient is recovering from the effects of radiation.  ***  Plan:  ***   *** minutes of total time was spent for this patient encounter, including preparation, face-to-face counseling with the patient and coordination of care, physical exam, and documentation of the encounter. ____________________________________  Billie Lade, PhD, MD  This document serves as a record of services personally performed by Antony Blackbird, MD. It was created on his behalf by Neena Rhymes, a trained medical scribe. The creation of this record is based on the scribe's personal observations and the provider's statements to them.  This document has been checked and approved by the attending provider.

## 2022-12-16 NOTE — Progress Notes (Signed)
Radiation Oncology         ([phone]) [phone] ________________________________  Name: [name] MRN: [id number]  Date: 12/17/2022  DOB: 1975-03-26  End of Treatment Note  Diagnosis:  Stage IA (cT1a, cN0, cM0) Right Breast LOQ, Invasive and in situ ductal carcinoma, ER+ / PR+ / Her2-, Grade 2: s/p lumpectomy and right axillary SLN biopsies      Indication for treatment: Curative        Radiation treatment dates: 10/01/22 through 11/15/22   Site/dose:   1) Right breast - 50.4 Gy delivered in 28 Fx at 1.8 Gy/Fx 2) Right breast boost - 10 Gy delivered in 5 Fx at 2 Gy/Fx  Technique/Mode: 3D / Photon   Beams/energy: 6X-FFF  Narrative: The patient tolerated radiation treatment relatively well. During her final weekly treatment check on 11/13/22, the patient endorsed fatigue and peeling beneath the right breast, to which she applied neosporin. Physical exam performed on that same date showed diffuse erythema throughout the right breast area and some hyperpigmentation changes. No moist desquamation was appreciated.   Plan: The patient has completed radiation treatment. The patient will return to radiation oncology clinic for routine followup in one month. I advised them to call or return sooner if they have any questions or concerns related to their recovery or treatment.  -----------------------------------  Billie Lade, PhD, MD  This document serves as a record of services personally performed by Antony Blackbird, MD. It was created on his behalf by Neena Rhymes, a trained medical scribe. The creation of this record is based on the scribe's personal observations and the provider's statements to them. This document has been checked and approved by the attending provider.

## 2022-12-17 ENCOUNTER — Ambulatory Visit
Admission: RE | Admit: 2022-12-17 | Discharge: 2022-12-17 | Disposition: A | Discharge status: Home / Self Care | Payer: 59 | Source: Ambulatory Visit | Attending: Radiation Oncology | Admitting: Radiation Oncology

## 2022-12-17 ENCOUNTER — Encounter: Payer: Self-pay | Admitting: Radiation Oncology

## 2022-12-17 VITALS — BP 123/92 | HR 75 | Temp 97.7°F | Resp 18 | Ht 65.0 in | Wt 161.2 lb

## 2022-12-17 DIAGNOSIS — Z923 Personal history of irradiation: Secondary | ICD-10-CM | POA: Diagnosis not present

## 2022-12-17 DIAGNOSIS — C50511 Malignant neoplasm of lower-outer quadrant of right female breast: Secondary | ICD-10-CM | POA: Diagnosis not present

## 2022-12-17 DIAGNOSIS — Z170 Estrogen receptor positive status [ER+]: Secondary | ICD-10-CM | POA: Insufficient documentation

## 2022-12-17 HISTORY — DX: Personal history of irradiation: Z92.3

## 2022-12-17 NOTE — Progress Notes (Signed)
[name] is here today for follow up post radiation to the breast.   Breast Side:Right    They completed their radiation on: 11/15/22   Does the patient complain of any of the following: Post radiation skin issues: No Breast Tenderness: No Breast Swelling: Yes, mild Lymphadema: No Range of Motion limitations: No Fatigue post radiation: No Appetite good/fair/poor: Good  Additional comments if applicable: Patient on 3rd week of tamoxifen. Patient tolerating well.   BP (!) 123/92 (BP Location: Left Arm, Patient Position: Sitting)   Pulse 75   Temp 97.7 F (36.5 C) (Temporal)   Resp 18   Ht 5\' 5"  (1.651 m)   Wt 161 lb 4 oz (73.1 kg)   SpO2 100%   BMI 26.83 kg/m

## 2023-01-16 ENCOUNTER — Telehealth: Payer: Self-pay | Admitting: *Deleted

## 2023-01-16 NOTE — Telephone Encounter (Signed)
This RN spoke with pt per her call stating she has found a new knot and is very concerned.  She states pm yesterday- after taking a shower she was applying lotion and noted " a knot on my left collar bone"  She states right side does not feel the same.  Area is on the bone not in her tissue.  " My husband said it's just my bone but I am more concerned and worried and want someone to see it who can tell me better "  Pt is not able to describe it well regarding size but is able to state it is not movable "I don't know but it is just concerning ",  Appt made for evaluation by MD per above.

## 2023-01-21 ENCOUNTER — Inpatient Hospital Stay: Payer: 59 | Attending: Hematology and Oncology | Admitting: Hematology and Oncology

## 2023-01-21 ENCOUNTER — Ambulatory Visit (HOSPITAL_COMMUNITY)
Admission: RE | Admit: 2023-01-21 | Discharge: 2023-01-21 | Disposition: A | Discharge status: Home / Self Care | Payer: 59 | Source: Ambulatory Visit | Attending: Hematology and Oncology | Admitting: Hematology and Oncology

## 2023-01-21 VITALS — BP 134/86 | HR 83 | Temp 98.1°F | Resp 16 | Wt 162.6 lb

## 2023-01-21 DIAGNOSIS — C50511 Malignant neoplasm of lower-outer quadrant of right female breast: Secondary | ICD-10-CM | POA: Diagnosis not present

## 2023-01-21 DIAGNOSIS — Z170 Estrogen receptor positive status [ER+]: Secondary | ICD-10-CM

## 2023-01-21 DIAGNOSIS — R2232 Localized swelling, mass and lump, left upper limb: Secondary | ICD-10-CM | POA: Diagnosis not present

## 2023-01-21 NOTE — Assessment & Plan Note (Addendum)
This is a pleasant 47 year old premenopausal female patient with newly diagnosed right breast grade 2 IDC, ER/PR positive HER2 negative, low proliferation index, grade 2 DCIS referred to breast MDC for additional recommendations.  Given the tumor size under 5 mm and strong ER/PR positivity, she will proceed with upfront surgery.  If the final pathology shows tumor measuring over 5 mm, we can certainly consider Oncotype DX testing and I discussed the following details about Oncotype DX testing today. Oncotype of 15, hence no adj chemo. She is now getting ready to be done with anti estrogen therapy.  Breast Cancer On tamoxifen, here for an interim visit. Tolerating tamoxifen very well.  New Left pre clavicular swelling, visible on inspection.   Noticed by patient on Wednesday. Tender to touch, possibly a lymph node or bruised bone. No associated cold or sore throat symptoms. Given patient's history of breast cancer, an ultrasound is warranted for further evaluation despite the low likelihood of malignancy. -Order ultrasound of right clavicular area as soon as possible.  [place] arranged for today. Pt had a near syncope, no falls because she became very anxious. Reassured her that this is being done out of abundance of caution. It is very less likely to be related to breast cancer.  Rachel Moulds MD

## 2023-01-21 NOTE — Progress Notes (Signed)
[facility] CONSULT NOTE  Patient Care Team: Hamrick, Durward Fortes, MD as PCP - General (Family Medicine) Quintella Reichert, MD as PCP - Cardiology (Cardiology) Antony Blackbird, MD as Consulting Physician (Radiation Oncology) Rachel Moulds, MD as Consulting Physician (Hematology and Oncology) Griselda Miner, MD as Consulting Physician (General Surgery) Donnelly Angelica, RN as Oncology Nurse Navigator Pershing Proud, RN as Oncology Nurse Navigator  CHIEF COMPLAINTS/PURPOSE OF CONSULTATION:  Newly diagnosed breast cancer  HISTORY OF PRESENTING ILLNESS:  [name] 47 y.o. female is here because of recent diagnosis of right breast mass  I reviewed her records extensively and collaborated the history with the patient.  SUMMARY OF ONCOLOGIC HISTORY: Oncology History  Malignant neoplasm of lower-outer quadrant of right breast of female, estrogen receptor positive (HCC)  07/27/2022 Mammogram   Patient had screening mammogram recall, underwent diagnostic mammogram, this showed suspicious 4 mm lower right breast mass. No abnormal appearing right axillary nodes.   07/31/2022 Pathology Results   Right breast needle core biopsy showed IDC grade 2, ER 90% pos, strong staining, PR 95% positive strong staining, Her 2 neg.   08/06/2022 Initial Diagnosis   Malignant neoplasm of lower-outer quadrant of right breast of female, estrogen receptor positive (HCC)    Genetic Testing   [facility] Common Cancer Panel+RNA was Negative. Report date is 08/17/2022.  The Common Hereditary Cancers Panel offered by [facility] includes sequencing and/or deletion duplication testing of the following 48 genes: APC, ATM, AXIN2, BAP1, BARD1, BMPR1A, BRCA1, BRCA2, BRIP1, CDH1, CDK4, CDKN2A (p14ARF and p16INK4a only), CHEK2, CTNNA1, DICER1, EPCAM (Deletion/duplication testing only), FH, GREM1 (promoter region duplication testing only), HOXB13, KIT, MBD4, MEN1, MLH1, MSH2, MSH3, MSH6, MUTYH, NF1, NHTL1, PALB2, PDGFRA,  PMS2, POLD1, POLE, PTEN, RAD51C, RAD51D, SDHA (sequencing analysis only except exon 14), SDHB, SDHC, SDHD, SMAD4, SMARCA4. STK11, TP53, TSC1, TSC2, and VHL.   08/08/2022 Cancer Staging   Staging form: Breast, AJCC 8th Edition - Clinical stage from 08/08/2022: Stage IA (cT1a, cN0, cM0, G2, ER+, PR+, HER2-) - Signed by Rachel Moulds, MD on 01/21/2023 Stage prefix: Initial diagnosis Histologic grading system: 3 grade system    This is a pleasant 47 year old female patient with past medical history significant for anxiety on medication, otherwise healthy referred to breast MDC for recommendations regarding her newly diagnosed right breast invasive ductal carcinoma.    Discussed the use of AI scribe software for clinical note transcription with the patient, who gave verbal consent to proceed.  History of Present Illness    The patient, with a history of breast cancer and currently on tamoxifen, presents with a new lump in their chest discovered on Wednesday. The lump is tender to touch and has been causing discomfort due to the patient's frequent touching and examination. The patient denies any recent cold, sore throat, or other systemic symptoms. They report no side effects from the tamoxifen. The patient is anxious about the new lump, fearing a recurrence of their cancer  MEDICAL HISTORY:  Past Medical History:  Diagnosis Date   Abnormal Pap smear    Anxiety    Breast cancer (HCC)    Breast mass    left   Depression    GERD (gastroesophageal reflux disease)    Headache(784.0)    migraines   History of miscarriage    History of radiation therapy    Right breast- 10/01/22-11/15/22- Dr. Antony Blackbird   Hypertension    Incomplete RBBB    Palpitations    Syncope and collapse  Tachycardia    Vaginal Pap smear, abnormal     SURGICAL HISTORY: Past Surgical History:  Procedure Laterality Date   BREAST BIOPSY Right 07/31/2022   [place] RT BREAST BX W LOC DEV 1ST LESION IMG BX SPEC US GUIDE  07/31/2022 [facility]   BREAST BIOPSY  08/16/2022   MM RT RADIOACTIVE SEED LOC MAMMO GUIDE 08/16/2022 [facility]   BREAST ENHANCEMENT SURGERY  2012   BREAST LUMPECTOMY WITH RADIOACTIVE SEED AND SENTINEL LYMPH NODE BIOPSY Right 08/20/2022   Procedure: RIGHT BREAST LUMPECTOMY WITH RADIOACTIVE SEED AND SENTINEL LYMPH NODE BIOPSY;  Surgeon: [name], MD;  Location: [facility];  Service: General;  Laterality: Right;  PEC BLOCK   CHOLECYSTECTOMY     DILATION AND CURETTAGE OF UTERUS  2008   LIPOMA EXCISION  2010   left leg   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   US ECHOCARDIOGRAPHY  06/17/2007   EF 55-60%    SOCIAL HISTORY: Social History   Socioeconomic History   Marital status: Married    Spouse name: Not on file   Number of children: 1   Years of education: Not on file   Highest education level: Not on file  Occupational History    Employer: UNEMPLOYED  Tobacco Use   Smoking status: Former    Current packs/day: 0.25    Types: Cigarettes   Smokeless tobacco: Never  Vaping Use   Vaping status: Never Used  Substance and Sexual Activity   Alcohol use: No   Drug use: No   Sexual activity: Yes    Birth control/protection: None  Other Topics Concern   Not on file  Social History Narrative   Not on file   Social Determinants of Health   Financial Resource Strain: Not on file  Food Insecurity: No Food Insecurity (08/08/2022)   Hunger Vital Sign    Worried About Running Out of Food in the Last Year: Never true    Ran Out of Food in the Last Year: Never true  Transportation Needs: No Transportation Needs (08/08/2022)   PRAPARE - Administrator, Civil Service (Medical): No    Lack of Transportation (Non-Medical): No  Physical Activity: Not on file  Stress: Not on file  Social Connections: Not on file  Intimate Partner Violence: Not At Risk (08/08/2022)   Humiliation, Afraid, Rape, and Kick  questionnaire    Fear of Current or Ex-Partner: No    Emotionally Abused: No    Physically Abused: No    Sexually Abused: No    FAMILY HISTORY: Family History  Problem Relation Age of Onset   Hypertension Mother    Hyperlipidemia Mother    Heart disease Mother    COPD Mother    Heart attack Father        X2   Heart disease Father    Hypertension Father    Colon cancer Father 23   Crohn's disease Brother    COPD Maternal Grandmother    Diabetes Maternal Grandmother    Stomach cancer Neg Hx    Esophageal cancer Neg Hx     ALLERGIES:  is allergic to amoxicillin, bactrim, erythromycin, and sulfa drugs cross reactors.  MEDICATIONS:  Current Outpatient Medications  Medication Sig Dispense Refill   Cholecalciferol (VITAMIN D) 125 MCG (5000 UT) CAPS Take by mouth.     escitalopram (LEXAPRO) 20 MG tablet Take 1 tablet by mouth daily.     hydrochlorothiazide (  HYDRODIURIL) 12.5 MG tablet Take 12.5 mg by mouth daily.     ibuprofen (ADVIL) 800 MG tablet Take 800 mg by mouth every 8 (eight) hours as needed.     LORazepam (ATIVAN) 1 MG tablet Take 1 mg by mouth as directed.     omeprazole (PRILOSEC) 20 MG capsule Take 20 mg by mouth daily.     oxyCODONE (ROXICODONE) 5 MG immediate release tablet Take 1 tablet (5 mg total) by mouth every 6 (six) hours as needed for severe pain. 10 tablet 0   propranolol (INDERAL) 20 MG tablet Take 20 mg by mouth 2 (two) times daily.     tamoxifen (NOLVADEX) 20 MG tablet Take 1 tablet (20 mg total) by mouth daily. 90 tablet 3   No current facility-administered medications for this visit.    REVIEW OF SYSTEMS:   Constitutional: Denies fevers, chills or abnormal night sweats Eyes: Denies blurriness of vision, double vision or watery eyes Ears, nose, mouth, throat, and face: Denies mucositis or sore throat Respiratory: Denies cough, dyspnea or wheezes Cardiovascular: Denies palpitation, chest discomfort or lower extremity swelling Gastrointestinal:   Denies nausea, heartburn or change in bowel habits Skin: Denies abnormal skin rashes Lymphatics: Denies new lymphadenopathy or easy bruising Neurological:Denies numbness, tingling or new weaknesses Behavioral/Psych: Mood is stable, no new changes  Breast: Denies any palpable lumps or discharge All other systems were reviewed with the patient and are negative.  PHYSICAL EXAMINATION: ECOG PERFORMANCE STATUS: 0 - Asymptomatic  Vitals:   01/21/23 1130  BP: 134/86  Pulse: 83  Resp: 16  Temp: 98.1 F (36.7 C)  SpO2: 99%   Filed Weights   01/21/23 1130  Weight: 162 lb 9.6 oz (73.8 kg)    GENERAL:alert, no distress and comfortable Palpable left preclavicular swelling, ? LN. No other LN  LABORATORY DATA:  I have reviewed the data as listed Lab Results  Component Value Date   WBC 8.1 08/08/2022   HGB 15.9 (H) 08/08/2022   HCT 44.3 08/08/2022   MCV 90.4 08/08/2022   PLT 391 08/08/2022   Lab Results  Component Value Date   NA 137 08/08/2022   K 3.8 08/08/2022   CL 99 08/08/2022   CO2 31 08/08/2022    RADIOGRAPHIC STUDIES: I have personally reviewed the radiological reports and agreed with the findings in the report.  ASSESSMENT AND PLAN:  Malignant neoplasm of lower-outer quadrant of right breast of female, estrogen receptor positive (HCC) This is a pleasant 47 year old premenopausal female patient with newly diagnosed right breast grade 2 IDC, ER/PR positive HER2 negative, low proliferation index, grade 2 DCIS referred to breast MDC for additional recommendations.  Given the tumor size under 5 mm and strong ER/PR positivity, she will proceed with upfront surgery.  If the final pathology shows tumor measuring over 5 mm, we can certainly consider Oncotype DX testing and I discussed the following details about Oncotype DX testing today. Oncotype of 15, hence no adj chemo. She is now getting ready to be done with anti estrogen therapy.  Breast Cancer On tamoxifen, here for an  interim visit. Tolerating tamoxifen very well.  New Left pre clavicular swelling, visible on inspection.   Noticed by patient on Wednesday. Tender to touch, possibly a lymph node or bruised bone. No associated cold or sore throat symptoms. Given patient's history of breast cancer, an ultrasound is warranted for further evaluation despite the low likelihood of malignancy. -Order ultrasound of right clavicular area as soon as possible.  [place] arranged  for today. Pt had a near syncope, no falls because she became very anxious. Reassured her that this is being done out of abundance of caution. It is very less likely to be related to breast cancer.  Rachel Moulds MD   Total time spent: 30 min All questions were answered. The patient knows to call the clinic with any problems, questions or concerns.    Rachel Moulds, MD 01/21/23

## 2023-01-22 ENCOUNTER — Telehealth: Payer: Self-pay

## 2023-01-22 NOTE — Telephone Encounter (Signed)
Called pt per MD and she verbalized thanks and understanding.  She will call back if this does not resolve.

## 2023-01-22 NOTE — Telephone Encounter (Signed)
-----   Message from [place] sent at 01/22/2023  8:46 AM EST ----- Vernona Rieger,  Can u tell her [place] didn't pick up any abnormality, so I would recommend to watch it for the next week to see if it resolves. Please ask pt to call us back if it doesn't. Thanks.

## 2023-01-28 ENCOUNTER — Other Ambulatory Visit: Payer: Self-pay | Admitting: *Deleted

## 2023-01-28 ENCOUNTER — Ambulatory Visit: Payer: 59 | Attending: General Surgery

## 2023-01-28 ENCOUNTER — Ambulatory Visit (HOSPITAL_COMMUNITY)
Admission: RE | Admit: 2023-01-28 | Discharge: 2023-01-28 | Disposition: A | Discharge status: Home / Self Care | Payer: 59 | Source: Ambulatory Visit | Attending: Hematology and Oncology | Admitting: Hematology and Oncology

## 2023-01-28 VITALS — Wt 159.5 lb

## 2023-01-28 DIAGNOSIS — C50511 Malignant neoplasm of lower-outer quadrant of right female breast: Secondary | ICD-10-CM | POA: Insufficient documentation

## 2023-01-28 DIAGNOSIS — M898X1 Other specified disorders of bone, shoulder: Secondary | ICD-10-CM

## 2023-01-28 DIAGNOSIS — M25611 Stiffness of right shoulder, not elsewhere classified: Secondary | ICD-10-CM | POA: Insufficient documentation

## 2023-01-28 DIAGNOSIS — Z170 Estrogen receptor positive status [ER+]: Secondary | ICD-10-CM | POA: Insufficient documentation

## 2023-01-28 DIAGNOSIS — Z483 Aftercare following surgery for neoplasm: Secondary | ICD-10-CM

## 2023-01-28 DIAGNOSIS — C50919 Malignant neoplasm of unspecified site of unspecified female breast: Secondary | ICD-10-CM | POA: Diagnosis not present

## 2023-01-28 DIAGNOSIS — M25512 Pain in left shoulder: Secondary | ICD-10-CM | POA: Diagnosis not present

## 2023-01-28 DIAGNOSIS — R293 Abnormal posture: Secondary | ICD-10-CM | POA: Insufficient documentation

## 2023-01-28 DIAGNOSIS — I890 Lymphedema, not elsewhere classified: Secondary | ICD-10-CM | POA: Insufficient documentation

## 2023-01-28 NOTE — Therapy (Signed)
OUTPATIENT PHYSICAL THERAPY SOZO SCREENING NOTE   Patient Name: [name] MRN: [id number] DOB:02-Oct-1975, 47 y.o., female Today's Date: 01/28/2023  PCP: Ailene Ravel, MD REFERRING PROVIDER: Griselda Miner, MD   PT End of Session - 01/28/23 1229     Visit Number 10   # unchanged due to screen only   PT Start Time 1202    PT Stop Time 1230    PT Time Calculation (min) 28 min    Activity Tolerance Patient tolerated treatment well    Behavior During Therapy [facility] for tasks assessed/performed             Past Medical History:  Diagnosis Date   Abnormal Pap smear    Anxiety    Breast cancer (HCC)    Breast mass    left   Depression    GERD (gastroesophageal reflux disease)    Headache(784.0)    migraines   History of miscarriage    History of radiation therapy    Right breast- 10/01/22-11/15/22- Dr. Antony Blackbird   Hypertension    Incomplete RBBB    Palpitations    Syncope and collapse    Tachycardia    Vaginal Pap smear, abnormal    Past Surgical History:  Procedure Laterality Date   BREAST BIOPSY Right 07/31/2022   [place] RT BREAST BX W LOC DEV 1ST LESION IMG BX SPEC US GUIDE 07/31/2022 [facility]   BREAST BIOPSY  08/16/2022   MM RT RADIOACTIVE SEED LOC MAMMO GUIDE 08/16/2022 [facility]MO[facility]   BREAST ENHANCEMENT SURGERY  2012   BREAST LUMPECTOMY WITH RADIOACTIVE SEED AND SENTINEL LYMPH NODE BIOPSY Right 08/20/2022   Procedure: RIGHT BREAST LUMPECTOMY WITH RADIOACTIVE SEED AND SENTINEL LYMPH NODE BIOPSY;  Surgeon: Griselda Miner, MD;  Location: [facility];  Service: General;  Laterality: Right;  PEC BLOCK   CHOLECYSTECTOMY     DILATION AND CURETTAGE OF UTERUS  2008   LIPOMA EXCISION  2010   left leg   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   US ECHOCARDIOGRAPHY  06/17/2007   EF 55-60%   Patient Active Problem List   Diagnosis Date Noted   Genetic testing 08/16/2022   Family history of colon  cancer in father 08/08/2022   Malignant neoplasm of lower-outer quadrant of right breast of female, estrogen receptor positive (HCC) 08/06/2022   Preterm premature rupture of membranes 06/03/2017   Advanced maternal age in multigravida 12/20/2016   Abnormal chromosomal and genetic finding on antenatal screening of mother 12/20/2016   [redacted] weeks gestation of pregnancy    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 She underwent a right lumpectomy and sentinel node biopsy (5 negative nodes) on 08/20/2022. It is ER/PR positive and HER2 negative with a Ki67 of 3%. She had breast implants placed in 2012.   PRECAUTIONS: right UE Lymphedema risk, None  SUBJECTIVE: Pt returns for her 3 month L-Dex screen.   PAIN:  Are you having pain? No  SOZO SCREENING: Patient was assessed today using the SOZO machine to determine the lymphedema index score. This was compared to her baseline score. It was determined that  she is within the recommended range when compared to her baseline and no further action is needed at this time. She will continue SOZO screenings. These are done every 3 months for 2 years post operatively followed by every 6 months for 2 years, and then annually.  Pt came in because she was concerned about a lump on her clavicle. Performed SOZO while she was here as she was due to return for this in 2 weeks. Dwaine Gale, PT assessed pts Lt clavicle and reports it feels like normal bone structure. Due to pts anxiety Marti inbox messaged Marianne Sofia who got the okay from Dr. Al Pimple to have pt go get an X-ray. Pt was in clinic still during this time so was updated immediately  about X-ray and was given instructions on how to get there. She reports feeling much less anxious upon leaving and was happy to get an X-ray.   L-DEX FLOWSHEETS - 01/28/23 1200       L-DEX LYMPHEDEMA SCREENING   Measurement Type Unilateral    L-DEX MEASUREMENT EXTREMITY Upper Extremity    POSITION  Standing    DOMINANT SIDE Right    At Risk Side Right    BASELINE SCORE (UNILATERAL) -1.6    L-DEX SCORE (UNILATERAL) 2.3    VALUE CHANGE (UNILAT) 3.9               Hermenia Bers, PTA 01/28/2023, 12:40 PM

## 2023-02-09 DIAGNOSIS — R300 Dysuria: Secondary | ICD-10-CM | POA: Diagnosis not present

## 2023-02-11 ENCOUNTER — Ambulatory Visit: Payer: 59

## 2023-02-18 ENCOUNTER — Encounter: Payer: Self-pay | Admitting: Physical Therapy

## 2023-02-18 ENCOUNTER — Ambulatory Visit: Payer: 59 | Admitting: Physical Therapy

## 2023-02-18 DIAGNOSIS — Z170 Estrogen receptor positive status [ER+]: Secondary | ICD-10-CM | POA: Diagnosis not present

## 2023-02-18 DIAGNOSIS — R293 Abnormal posture: Secondary | ICD-10-CM

## 2023-02-18 DIAGNOSIS — M25611 Stiffness of right shoulder, not elsewhere classified: Secondary | ICD-10-CM | POA: Diagnosis not present

## 2023-02-18 DIAGNOSIS — Z483 Aftercare following surgery for neoplasm: Secondary | ICD-10-CM | POA: Diagnosis not present

## 2023-02-18 DIAGNOSIS — I890 Lymphedema, not elsewhere classified: Secondary | ICD-10-CM

## 2023-02-18 DIAGNOSIS — C50511 Malignant neoplasm of lower-outer quadrant of right female breast: Secondary | ICD-10-CM | POA: Diagnosis not present

## 2023-02-18 NOTE — Therapy (Addendum)
OUTPATIENT PHYSICAL THERAPY BREAST CANCER TREATMENT   Patient Name: [name] MRN: [id number] DOB:Mar 05, 1975, 47 y.o., female Today's Date: 02/18/2023  END OF SESSION:  PT End of Session - 02/18/23 1055     Visit Number 11    Number of Visits 19    Date for PT Re-Evaluation 03/18/23    PT Start Time 1006    PT Stop Time 1055    PT Time Calculation (min) 49 min    Activity Tolerance Patient tolerated treatment well    Behavior During Therapy WFL for tasks assessed/performed              Past Medical History:  Diagnosis Date   Abnormal Pap smear    Anxiety    Breast cancer (HCC)    Breast mass    left   Depression    GERD (gastroesophageal reflux disease)    Headache(784.0)    migraines   History of miscarriage    History of radiation therapy    Right breast- 10/01/22-11/15/22- Dr. Antony Blackbird   Hypertension    Incomplete RBBB    Palpitations    Syncope and collapse    Tachycardia    Vaginal Pap smear, abnormal    Past Surgical History:  Procedure Laterality Date   BREAST BIOPSY Right 07/31/2022   [place] RT BREAST BX W LOC DEV 1ST LESION IMG BX SPEC US GUIDE 07/31/2022 [facility]   BREAST BIOPSY  08/16/2022   MM RT RADIOACTIVE SEED LOC MAMMO GUIDE 08/16/2022 [facility]   BREAST ENHANCEMENT SURGERY  2012   BREAST LUMPECTOMY WITH RADIOACTIVE SEED AND SENTINEL LYMPH NODE BIOPSY Right 08/20/2022   Procedure: RIGHT BREAST LUMPECTOMY WITH RADIOACTIVE SEED AND SENTINEL LYMPH NODE BIOPSY;  Surgeon: Griselda Miner, MD;  Location:  [facility];  Service: General;  Laterality: Right;  PEC BLOCK   CHOLECYSTECTOMY     DILATION AND CURETTAGE OF UTERUS  2008   LIPOMA EXCISION  2010   left leg   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   US ECHOCARDIOGRAPHY  06/17/2007   EF 55-60%   Patient Active Problem List   Diagnosis Date Noted   Genetic testing 08/16/2022   Family history of colon cancer in father  08/08/2022   Malignant neoplasm of lower-outer quadrant of right breast of female, estrogen receptor positive (HCC) 08/06/2022   Preterm premature rupture of membranes 06/03/2017   Advanced maternal age in multigravida 12/20/2016   Abnormal chromosomal and genetic finding on antenatal screening of mother 12/20/2016   [redacted] weeks gestation of pregnancy    Inappropriate sinus tachycardia (HCC) 03/17/2014   Pericardial effusion 03/17/2014   Headache    Anxiety    Palpitations    Tachycardia     REFERRING PROVIDER: Dr. Chevis Pretty  REFERRING DIAG: Right breast cancer  THERAPY DIAG:  Lymphedema, not elsewhere classified  Stiffness of right shoulder, not elsewhere classified  Aftercare following surgery for neoplasm  Abnormal posture  Malignant neoplasm of lower-outer quadrant of right breast of female, estrogen receptor positive (HCC)  Rationale for Evaluation and Treatment: Rehabilitation  ONSET DATE: 08/20/2022  SUBJECTIVE:  SUBJECTIVE STATEMENT: Last week I felt like my armpit has been more tight. I don't know if I need to increase the massages. You can see the print in my bra when I take it off. I can feel it pulling in my armpit. I have cording. I am doing self MLD once a day and sometimes twice.   EVAL Patient reports she underwent a right lumpectomy and sentinel node biopsy (5 negative nodes) on 08/20/2022. Her Oncotype score was low so she will not need chemotherapy. She will proceed to radiation and anti-estrogen therapy.  Today is her first day of radiation.  PERTINENT HISTORY:  Patient was diagnosed on 07/19/2022 with right grade 2 invasive ductal carcinoma breast cancer. She underwent a right lumpectomy and sentinel node biopsy (5 negative nodes) on 08/20/2022. It is ER/PR positive and HER2  negative with a Ki67 of 3%. She had breast implants placed in 2012.   PATIENT GOALS:  Reassess how my recovery is going related to arm function, pain, and swelling.  PAIN:  Are you having pain? No - just tightness and soreness  PRECAUTIONS: Recent Surgery, right UE Lymphedema risk  RED FLAGS: None   ACTIVITY LEVEL / LEISURE: She goes to the gym 3-4x/week, does 30 min of cardio and uses weight equipment. She also runs a chicken farm. Since surgery, she has just been walking.     OBJECTIVE:   OBSERVATIONS: 02/18/23: Lymphedema noted throughout R breast with increased pore size and fibrosis palpable Eval: Significant edema present right breast; see photo. Incisions have healed well with no signs of infection or redness.  POSTURE:  Forward head and rounded shoulders  LYMPHEDEMA ASSESSMENT:   UPPER EXTREMITY AROM/PROM:   A/PROM RIGHT   eval   RIGHT 10/01/2022 Right 12/04/22 RIGHT 02/18/23  Shoulder extension 53 52 59   Shoulder flexion 151 167 174 175  Shoulder abduction 164 172 181 167 with pulling and tightness  Shoulder internal rotation 60 73 79   Shoulder external rotation 80 90  90                          (Blank rows = not tested)   A/PROM LEFT   eval  Shoulder extension 50  Shoulder flexion 150  Shoulder abduction 174  Shoulder internal rotation 62  Shoulder external rotation 85                          (Blank rows = not tested)   CERVICAL AROM: All within normal limits   UPPER EXTREMITY STRENGTH: WNL   LYMPHEDEMA ASSESSMENTS:    LANDMARK RIGHT   eval RIGHT 10/01/2022 RIGHT 02/18/23  10 cm proximal to olecranon process 28.1 29.4 27.5  Olecranon process 24.2 24.3 23  10  cm proximal to ulnar styloid process 22.4 22.3 20.8  Just proximal to ulnar styloid process 15.1 15.2 14.9  Across hand at thumb web space 16.8 16.9 16.7  At base of 2nd digit 5.8 5.6 5.7  (Blank rows = not tested)   LANDMARK LEFT   eval LEFT 10/01/2022  10 cm proximal to  olecranon process 27.8 28  Olecranon process 23.1 23.2  10 cm proximal to ulnar styloid process 21 21.4  Just proximal to ulnar styloid process 14.7 14.6  Across hand at thumb web space 16.7 16.7  At base of 2nd digit 5.9 5.8  (Blank rows = not tested)    Surgery type/Date: 08/20/2022 right lumpectomy  and sentinel node biopsy Number of lymph nodes removed: 5 Current/past treatment (chemo, radiation, hormone therapy): none Other symptoms:  Heaviness/tightness No Pain No Pitting edema No Infections No Decreased scar mobility No Stemmer sign No   Neldon Mc - 02/18/23 0001     Open a tight or new jar No difficulty    Do heavy household chores (wash walls, wash floors) No difficulty    Carry a shopping bag or briefcase No difficulty    Wash your back Mild difficulty    Use a knife to cut food No difficulty    Recreational activities in which you take some force or impact through your arm, shoulder, or hand (golf, hammering, tennis) No difficulty    During the past week, to what extent has your arm, shoulder or hand problem interfered with your normal social activities with family, friends, neighbors, or groups? Slightly    During the past week, to what extent has your arm, shoulder or hand problem limited your work or other regular daily activities Slightly    Arm, shoulder, or hand pain. Mild    Tingling (pins and needles) in your arm, shoulder, or hand None    Difficulty Sleeping No difficulty    DASH Score 9.09 %            Breast Complaints Scale: 21  TREATMENT PERFORMED: 02/18/23: Manual Therapy MLD: In supine: Short neck, 5 diaphragmatic breaths, Lt axillary nodes, establishment of anterior interaxillary pathway, R inguinal nodes and establishment of Rt axilloinguinal pathway, then Rt breast moving fluid towards pathways then finished retracing all steps. MFR to cording noticed today in Rt axilla  P/ROM to Rt shoulder into flexion and abduction with end range tightness  noted but able to achieve full PROM  Educated pt to begin to wear compression bra daily 12/04/22: Manual Therapy MLD: In supine: Short neck, superficial and deep abdominals, Lt axillary nodes, Lt anterior intact upper quadrant sequence, establishment of anterior interaxillary pathway, R inguinal nodes and establishment of Rt axilloinguinal pathway, then Rt breast moving fluid towards pathways, then into Lt S/L for focus on lateral breast redirecting towards lateral anastomosis, then finished retracing all steps in supine. MFR to cording noticed today in Rt axilla and instructed pt in how to perform self MFR at home and explained what cording was. P/ROM to Rt shoulder into flexion, abd and D2 with scapular depression throughout A/ROM measurements taken for goal assess Cut and issued Peach Medi foam in stockinette (small dot) for pt to wear at inferior breast wear fibrosis palpated around incision  11/05/22 Manual Therapy MLD: In supine: Short neck, superficial and deep abdominals, Lt axillary nodes, Lt anterior intact upper quadrant sequence, establishment of anterior interaxillary pathway, R inguinal nodes and establishment of Rt axilloinguinal pathway, then Rt breast moving fluid towards pathways with more gentle pressure and avoiding red/open area of inferior breast. Unable to lay sidelying today then retracing all steps in supine.  P/ROM to Rt shoulder into flexion, abd and D2 with scapular depression throughout  10/26/22 Manual Therapy MLD: In supine: Short neck, superficial and deep abdominals, Lt axillary nodes, Lt anterior intact upper quadrant sequence, establishment of anterior interaxillary pathway, R inguinal nodes and establishment of Rt axilloinguinal pathway, then Rt breast moving fluid towards pathways spending extra time in any areas of fibrosis, Lt S/L for further focus to lateral breast redirecting towards lateral anastomosis, then finished retracing all steps in supine.  P/ROM to Rt  shoulder into flexion, abd and D2 with  scapular depression throughout       PATIENT EDUCATION:  Education details: HEP and lymphedema risk reduction Person educated: Patient Education method: Explanation Education comprehension: verbalized understanding  HOME EXERCISE PROGRAM: Reviewed previously given post op HEP.   ASSESSMENT:  CLINICAL IMPRESSION: Pt returns to therapy. She was not discharged from her prior episode of care. She reports she began noticing increased swellign in her R breast and increased tightness in her R axilla. There are most likely deep cords present but they were hard to palpate this visit. Her ROM has decreased from where it was in October. Her breast has increased pore size and increased fibrosis inferiorly. She was educated to begin wearing her compression bra again. She felt much better at end of session today following MLD. She would benefit from skilled PT services to decrease R breast lymphedema, ensure pt is doing MLD correctly and is able to independently manage and decrease tightness at end range.   Pt will benefit from skilled therapeutic intervention to improve on the following deficits: Decreased knowledge of precautions, impaired UE functional use, pain, decreased ROM, postural dysfunction.   PT treatment/interventions: ADL/Self care home management, Therapeutic exercises, Therapeutic activity, Patient/Family education, Self Care, Manual lymph drainage, Manual therapy, and Re-evaluation   GOALS: Goals reviewed with patient? Yes  LONG TERM GOALS:  (STG=LTG)  GOALS Name Target Date  Goal status  1 Pt will demonstrate she has regained full shoulder ROM and function post operatively compared to baselines.  Baseline: 10/29/2022 ONGOING  2 Patient will report/demonstrate a >/= 50% reduction in right breast edema 10/29/2022 MET - 12/04/22:80-90% reported  3 Patient will verbalize good understanding of performing self manual lymph drainage for right  breast swelling. 10/29/2022 MET  4 Pt will be independent in self MLD and able to independently manage R breast lymphedema to decrease risk of infection 03/18/23 NEW  5 Pt will be able to abduct and flex R shoulder to end range without R axillary tightness to allow improved comfort.  03/18/23 NEW  6 Pt will report a 50% improvement in R breast lymphedema as evidenced by decreased fibrosis and decreased pore size to allow improved comfort. 03/18/23 NEW     PLAN:  PT FREQUENCY/DURATION: 2x/week for 4 weeks  PLAN FOR NEXT SESSION: end range stretching, MFR to R axilla, MLD to R breast and assess pt's technique and provide appropriate cues, is she wearing compression bra?   [facility]  [address], [address]  [place] [address]  [phone]   Milagros Loll Melissa, [place] 02/18/2023, 11:51 AM

## 2023-02-26 ENCOUNTER — Ambulatory Visit: Payer: 59 | Attending: General Surgery

## 2023-02-26 DIAGNOSIS — R293 Abnormal posture: Secondary | ICD-10-CM | POA: Insufficient documentation

## 2023-02-26 DIAGNOSIS — Z170 Estrogen receptor positive status [ER+]: Secondary | ICD-10-CM | POA: Diagnosis not present

## 2023-02-26 DIAGNOSIS — C50511 Malignant neoplasm of lower-outer quadrant of right female breast: Secondary | ICD-10-CM | POA: Insufficient documentation

## 2023-02-26 DIAGNOSIS — Z483 Aftercare following surgery for neoplasm: Secondary | ICD-10-CM | POA: Diagnosis not present

## 2023-02-26 DIAGNOSIS — I890 Lymphedema, not elsewhere classified: Secondary | ICD-10-CM | POA: Diagnosis not present

## 2023-02-26 DIAGNOSIS — M25611 Stiffness of right shoulder, not elsewhere classified: Secondary | ICD-10-CM | POA: Diagnosis not present

## 2023-02-26 NOTE — Therapy (Signed)
 OUTPATIENT PHYSICAL THERAPY BREAST CANCER TREATMENT   Patient Name: [name] MRN: [id number] DOB:11-Nov-1975, 48 y.o., female Today's Date: 02/26/2023  END OF SESSION:  PT End of Session - 02/26/23 1109     Visit Number 12    Number of Visits 19    Date for PT Re-Evaluation 03/18/23    PT Start Time 1107    PT Stop Time 1201    PT Time Calculation (min) 54 min    Activity Tolerance Patient tolerated treatment well    Behavior During Therapy WFL for tasks assessed/performed              Past Medical History:  Diagnosis Date   Abnormal Pap smear    Anxiety    Breast cancer (HCC)    Breast mass    left   Depression    GERD (gastroesophageal reflux disease)    Headache(784.0)    migraines   History of miscarriage    History of radiation therapy    Right breast- 10/01/22-11/15/22- Dr. Lynwood Nasuti   Hypertension    Incomplete RBBB    Palpitations    Syncope and collapse    Tachycardia    Vaginal Pap smear, abnormal    Past Surgical History:  Procedure Laterality Date   BREAST BIOPSY Right 07/31/2022   US  RT BREAST BX W LOC DEV 1ST LESION IMG BX SPEC US  GUIDE 07/31/2022 [facility]   BREAST BIOPSY  08/16/2022   MM RT RADIOACTIVE SEED LOC MAMMO GUIDE 08/16/2022 [facility]   BREAST ENHANCEMENT SURGERY  2012   BREAST LUMPECTOMY WITH RADIOACTIVE SEED AND SENTINEL LYMPH NODE BIOPSY Right 08/20/2022   Procedure: RIGHT BREAST LUMPECTOMY WITH RADIOACTIVE SEED AND SENTINEL LYMPH NODE BIOPSY;  Surgeon: Curvin Mt III, MD;  Location: [facility];  Service: General;  Laterality: Right;  PEC BLOCK   CHOLECYSTECTOMY     DILATION AND CURETTAGE OF UTERUS  2008   LIPOMA EXCISION  2010   left leg   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   US  ECHOCARDIOGRAPHY  06/17/2007   EF 55-60%   Patient Active Problem List   Diagnosis Date Noted   Genetic testing 08/16/2022   Family history of colon cancer in father  08/08/2022   Malignant neoplasm of lower-outer quadrant of right breast of female, estrogen receptor positive (HCC) 08/06/2022   Preterm premature rupture of membranes 06/03/2017   Advanced maternal age in multigravida 12/20/2016   Abnormal chromosomal and genetic finding on antenatal screening of mother 12/20/2016   [redacted] weeks gestation of pregnancy    Inappropriate sinus tachycardia (HCC) 03/17/2014   Pericardial effusion 03/17/2014   Headache    Anxiety    Palpitations    Tachycardia     REFERRING PROVIDER: Dr. Mt Curvin  REFERRING DIAG: Right breast cancer  THERAPY DIAG:  Lymphedema, not elsewhere classified  Stiffness of right shoulder, not elsewhere classified  Aftercare following surgery for neoplasm  Abnormal posture  Malignant neoplasm of lower-outer quadrant of right breast of female, estrogen receptor positive (HCC)  Rationale for Evaluation and Treatment: Rehabilitation  ONSET DATE: 08/20/2022  SUBJECTIVE:  SUBJECTIVE STATEMENT: My breast felt so much better after the last session. I've been working on incorporating the MLD and wearing my compression bra as much as I can tolerate.   EVAL Patient reports she underwent a right lumpectomy and sentinel node biopsy (5 negative nodes) on 08/20/2022. Her Oncotype score was low so she will not need chemotherapy. She will proceed to radiation and anti-estrogen therapy.  Today is her first day of radiation.  PERTINENT HISTORY:  Patient was diagnosed on 07/19/2022 with right grade 2 invasive ductal carcinoma breast cancer. She underwent a right lumpectomy and sentinel node biopsy (5 negative nodes) on 08/20/2022. It is ER/PR positive and HER2 negative with a Ki67 of 3%. She had breast implants placed in 2012.   PATIENT GOALS:  Reassess how my  recovery is going related to arm function, pain, and swelling.  PAIN:  Are you having pain? No - just tightness and soreness  PRECAUTIONS: Recent Surgery, right UE Lymphedema risk  RED FLAGS: None   ACTIVITY LEVEL / LEISURE: She goes to the gym 3-4x/week, does 30 min of cardio and uses weight equipment. She also runs a chicken farm. Since surgery, she has just been walking.     OBJECTIVE:   OBSERVATIONS: 02/18/23: Lymphedema noted throughout R breast with increased pore size and fibrosis palpable Eval: Significant edema present right breast; see photo. Incisions have healed well with no signs of infection or redness.  POSTURE:  Forward head and rounded shoulders  LYMPHEDEMA ASSESSMENT:   UPPER EXTREMITY AROM/PROM:   A/PROM RIGHT   eval   RIGHT 10/01/2022 Right 12/04/22 RIGHT 02/18/23  Shoulder extension 53 52 59   Shoulder flexion 151 167 174 175  Shoulder abduction 164 172 181 167 with pulling and tightness  Shoulder internal rotation 60 73 79   Shoulder external rotation 80 90  90                          (Blank rows = not tested)   A/PROM LEFT   eval  Shoulder extension 50  Shoulder flexion 150  Shoulder abduction 174  Shoulder internal rotation 62  Shoulder external rotation 85                          (Blank rows = not tested)   CERVICAL AROM: All within normal limits   UPPER EXTREMITY STRENGTH: WNL   LYMPHEDEMA ASSESSMENTS:    LANDMARK RIGHT   eval RIGHT 10/01/2022 RIGHT 02/18/23  10 cm proximal to olecranon process 28.1 29.4 27.5  Olecranon process 24.2 24.3 23  10  cm proximal to ulnar styloid process 22.4 22.3 20.8  Just proximal to ulnar styloid process 15.1 15.2 14.9  Across hand at thumb web space 16.8 16.9 16.7  At base of 2nd digit 5.8 5.6 5.7  (Blank rows = not tested)   LANDMARK LEFT   eval LEFT 10/01/2022  10 cm proximal to olecranon process 27.8 28  Olecranon process 23.1 23.2  10 cm proximal to ulnar styloid process 21 21.4   Just proximal to ulnar styloid process 14.7 14.6  Across hand at thumb web space 16.7 16.7  At base of 2nd digit 5.9 5.8  (Blank rows = not tested)    Surgery type/Date: 08/20/2022 right lumpectomy and sentinel node biopsy Number of lymph nodes removed: 5 Current/past treatment (chemo, radiation, hormone therapy): none Other symptoms:  Heaviness/tightness No Pain No Pitting edema No Infections  No Decreased scar mobility No Stemmer sign No    Breast Complaints Scale: 21  TREATMENT PERFORMED: 02/26/23: Manual Therapy MLD: In supine: Short neck, 5 diaphragmatic breaths, Lt axillary nodes, Lt intact anterior thorax, establishment of anterior inter-axillary anastomosis, Rt inguinal nodes and establishment of Rt axillo-inguinal anastomosis, then Rt breast focusing on areas of fibrosis and most fullness inferiorly and medial, moving fluid towards pathways then finished retracing all steps briefly reviewing pressure and skin stretch with pt. MFR to Rt axilla but no cording noted today P/ROM to Rt shoulder into flexion, abduction, and D2 with scapular depression by therapist throughout; end range tightness noted but able to achieve near full PROM  02/18/23: Manual Therapy MLD: In supine: Short neck, 5 diaphragmatic breaths, Lt axillary nodes, establishment of anterior interaxillary pathway, R inguinal nodes and establishment of Rt axilloinguinal pathway, then Rt breast moving fluid towards pathways then finished retracing all steps. MFR to cording noticed today in Rt axilla  P/ROM to Rt shoulder into flexion and abduction with end range tightness noted but able to achieve full PROM  Educated pt to begin to wear compression bra daily  12/04/22: Manual Therapy MLD: In supine: Short neck, superficial and deep abdominals, Lt axillary nodes, Lt anterior intact upper quadrant sequence, establishment of anterior interaxillary pathway, R inguinal nodes and establishment of Rt axilloinguinal  pathway, then Rt breast moving fluid towards pathways, then into Lt S/L for focus on lateral breast redirecting towards lateral anastomosis, then finished retracing all steps in supine. MFR to cording noticed today in Rt axilla and instructed pt in how to perform self MFR at home and explained what cording was. P/ROM to Rt shoulder into flexion, abd and D2 with scapular depression throughout A/ROM measurements taken for goal assess Cut and issued Peach Medi foam in stockinette (small dot) for pt to wear at inferior breast wear fibrosis palpated around incision       PATIENT EDUCATION:  Education details: HEP and lymphedema risk reduction Person educated: Patient Education method: Explanation Education comprehension: verbalized understanding  HOME EXERCISE PROGRAM: Reviewed previously given post op HEP.   ASSESSMENT:  CLINICAL IMPRESSION: Continued with Rt breast MLD and reviewed this with pt while performing. She reports has resumed wearing her compression bra but reports it feels so tight that after awhile it becomes uncomfortable and she has to remove it. Fibrosis noted at inferior breast that does soften some by end of session.   Pt will benefit from skilled therapeutic intervention to improve on the following deficits: Decreased knowledge of precautions, impaired UE functional use, pain, decreased ROM, postural dysfunction.   PT treatment/interventions: ADL/Self care home management, Therapeutic exercises, Therapeutic activity, Patient/Family education, Self Care, Manual lymph drainage, Manual therapy, and Re-evaluation   GOALS: Goals reviewed with patient? Yes  LONG TERM GOALS:  (STG=LTG)  GOALS Name Target Date  Goal status  1 Pt will demonstrate she has regained full shoulder ROM and function post operatively compared to baselines.  Baseline: 10/29/2022 ONGOING  2 Patient will report/demonstrate a >/= 50% reduction in right breast edema 10/29/2022 MET - 12/04/22:80-90%  reported  3 Patient will verbalize good understanding of performing self manual lymph drainage for right breast swelling. 10/29/2022 MET  4 Pt will be independent in self MLD and able to independently manage R breast lymphedema to decrease risk of infection 03/18/23 NEW  5 Pt will be able to abduct and flex R shoulder to end range without R axillary tightness to allow improved comfort.  03/18/23 NEW  6 Pt will report a 50% improvement in R breast lymphedema as evidenced by decreased fibrosis and decreased pore size to allow improved comfort. 03/18/23 NEW     PLAN:  PT FREQUENCY/DURATION: 2x/week for 4 weeks  PLAN FOR NEXT SESSION: Need to order swell spot? New compression bra?? end range stretching, MFR to R axilla, Cont MLD to R breast and assess pt's technique and provide appropriate cues   [facility]  [address], [address]  [place] [address]  [phone]   Aden Berwyn Caldron, PTA 02/26/2023, 12:10 PM

## 2023-02-28 ENCOUNTER — Encounter: Payer: 59 | Admitting: Rehabilitation

## 2023-03-01 ENCOUNTER — Encounter: Payer: Self-pay | Admitting: Rehabilitation

## 2023-03-01 ENCOUNTER — Ambulatory Visit: Payer: 59 | Admitting: Rehabilitation

## 2023-03-01 DIAGNOSIS — Z483 Aftercare following surgery for neoplasm: Secondary | ICD-10-CM

## 2023-03-01 DIAGNOSIS — C50511 Malignant neoplasm of lower-outer quadrant of right female breast: Secondary | ICD-10-CM

## 2023-03-01 DIAGNOSIS — R293 Abnormal posture: Secondary | ICD-10-CM | POA: Diagnosis not present

## 2023-03-01 DIAGNOSIS — Z170 Estrogen receptor positive status [ER+]: Secondary | ICD-10-CM | POA: Diagnosis not present

## 2023-03-01 DIAGNOSIS — I890 Lymphedema, not elsewhere classified: Secondary | ICD-10-CM | POA: Diagnosis not present

## 2023-03-01 DIAGNOSIS — M25611 Stiffness of right shoulder, not elsewhere classified: Secondary | ICD-10-CM

## 2023-03-01 NOTE — Therapy (Signed)
 OUTPATIENT PHYSICAL THERAPY BREAST CANCER TREATMENT   Patient Name: [name] MRN: [id number] DOB:1975/12/18, 48 y.o., female Today's Date: 03/01/2023  END OF SESSION:  PT End of Session - 03/01/23 1205     Visit Number 13    Number of Visits 19    Date for PT Re-Evaluation 03/18/23    PT Start Time 1100    PT Stop Time 1154    PT Time Calculation (min) 54 min    Activity Tolerance Patient tolerated treatment well    Behavior During Therapy WFL for tasks assessed/performed               Past Medical History:  Diagnosis Date   Abnormal Pap smear    Anxiety    Breast cancer (HCC)    Breast mass    left   Depression    GERD (gastroesophageal reflux disease)    Headache(784.0)    migraines   History of miscarriage    History of radiation therapy    Right breast- 10/01/22-11/15/22- Dr. Lynwood Nasuti   Hypertension    Incomplete RBBB    Palpitations    Syncope and collapse    Tachycardia    Vaginal Pap smear, abnormal    Past Surgical History:  Procedure Laterality Date   BREAST BIOPSY Right 07/31/2022   US  RT BREAST BX W LOC DEV 1ST LESION IMG BX SPEC US  GUIDE 07/31/2022 [facility]   BREAST BIOPSY  08/16/2022   MM RT RADIOACTIVE SEED LOC MAMMO GUIDE 08/16/2022 [facility]   BREAST ENHANCEMENT SURGERY  2012   BREAST LUMPECTOMY WITH RADIOACTIVE SEED AND SENTINEL LYMPH NODE BIOPSY Right 08/20/2022   Procedure: RIGHT BREAST LUMPECTOMY WITH RADIOACTIVE SEED AND SENTINEL LYMPH NODE BIOPSY;  Surgeon: Curvin Mt III, MD;  Location: [facility];  Service: General;  Laterality: Right;  PEC BLOCK   CHOLECYSTECTOMY     DILATION AND CURETTAGE OF UTERUS  2008   LIPOMA EXCISION  2010   left leg   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   US  ECHOCARDIOGRAPHY  06/17/2007   EF 55-60%   Patient Active Problem List   Diagnosis Date Noted   Genetic testing 08/16/2022   Family history of colon cancer in  father 08/08/2022   Malignant neoplasm of lower-outer quadrant of right breast of female, estrogen receptor positive (HCC) 08/06/2022   Preterm premature rupture of membranes 06/03/2017   Advanced maternal age in multigravida 12/20/2016   Abnormal chromosomal and genetic finding on antenatal screening of mother 12/20/2016   [redacted] weeks gestation of pregnancy    Inappropriate sinus tachycardia (HCC) 03/17/2014   Pericardial effusion 03/17/2014   Headache    Anxiety    Palpitations    Tachycardia     REFERRING PROVIDER: Dr. Mt Curvin  REFERRING DIAG: Right breast cancer  THERAPY DIAG:  Lymphedema, not elsewhere classified  Stiffness of right shoulder, not elsewhere classified  Aftercare following surgery for neoplasm  Abnormal posture  Malignant neoplasm of lower-outer quadrant of right breast of female, estrogen receptor positive (HCC)  Rationale for Evaluation and Treatment: Rehabilitation  ONSET DATE: 08/20/2022  SUBJECTIVE:  SUBJECTIVE STATEMENT: It feels a bit better.  Its just so much easier and better for you to help me vs me doing it myself.   EVAL Patient reports she underwent a right lumpectomy and sentinel node biopsy (5 negative nodes) on 08/20/2022. Her Oncotype score was low so she will not need chemotherapy. She will proceed to radiation and anti-estrogen therapy.  Today is her first day of radiation.  PERTINENT HISTORY:  Patient was diagnosed on 07/19/2022 with right grade 2 invasive ductal carcinoma breast cancer. She underwent a right lumpectomy and sentinel node biopsy (5 negative nodes) on 08/20/2022. It is ER/PR positive and HER2 negative with a Ki67 of 3%. She had breast implants placed in 2012.   PATIENT GOALS:  Reassess how my recovery is going related to arm function, pain, and  swelling.  PAIN:  Are you having pain? No - just tightness and soreness  PRECAUTIONS: Recent Surgery, right UE Lymphedema risk  RED FLAGS: None   ACTIVITY LEVEL / LEISURE: She goes to the gym 3-4x/week, does 30 min of cardio and uses weight equipment. She also runs a chicken farm. Since surgery, she has just been walking.     OBJECTIVE:   OBSERVATIONS: 02/18/23: Lymphedema noted throughout R breast with increased pore size and fibrosis palpable Eval: Significant edema present right breast; see photo. Incisions have healed well with no signs of infection or redness.  POSTURE:  Forward head and rounded shoulders  LYMPHEDEMA ASSESSMENT:   UPPER EXTREMITY AROM/PROM:   A/PROM RIGHT   eval   RIGHT 10/01/2022 Right 12/04/22 RIGHT 02/18/23  Shoulder extension 53 52 59   Shoulder flexion 151 167 174 175  Shoulder abduction 164 172 181 167 with pulling and tightness  Shoulder internal rotation 60 73 79   Shoulder external rotation 80 90  90                          (Blank rows = not tested)   A/PROM LEFT   eval  Shoulder extension 50  Shoulder flexion 150  Shoulder abduction 174  Shoulder internal rotation 62  Shoulder external rotation 85                          (Blank rows = not tested)   CERVICAL AROM: All within normal limits   UPPER EXTREMITY STRENGTH: WNL   LYMPHEDEMA ASSESSMENTS:    LANDMARK RIGHT   eval RIGHT 10/01/2022 RIGHT 02/18/23  10 cm proximal to olecranon process 28.1 29.4 27.5  Olecranon process 24.2 24.3 23  10  cm proximal to ulnar styloid process 22.4 22.3 20.8  Just proximal to ulnar styloid process 15.1 15.2 14.9  Across hand at thumb web space 16.8 16.9 16.7  At base of 2nd digit 5.8 5.6 5.7  (Blank rows = not tested)   LANDMARK LEFT   eval LEFT 10/01/2022  10 cm proximal to olecranon process 27.8 28  Olecranon process 23.1 23.2  10 cm proximal to ulnar styloid process 21 21.4  Just proximal to ulnar styloid process 14.7 14.6   Across hand at thumb web space 16.7 16.7  At base of 2nd digit 5.9 5.8  (Blank rows = not tested)    Surgery type/Date: 08/20/2022 right lumpectomy and sentinel node biopsy Number of lymph nodes removed: 5 Current/past treatment (chemo, radiation, hormone therapy): none Other symptoms:  Heaviness/tightness No Pain No Pitting edema No Infections No Decreased scar mobility No  Stemmer sign No    Breast Complaints Scale: 21  TREATMENT PERFORMED: 03/01/23 Manual Therapy MLD: In supine: Short neck, 5 diaphragmatic breaths, Lt axillary nodes, Lt intact anterior thorax, establishment of anterior inter-axillary anastomosis, Rt inguinal nodes and establishment of Rt axillo-inguinal anastomosis, then Rt breast focusing on areas of fibrosis and most fullness inferiorly and medial, moving fluid towards pathways then finished retracing all steps briefly reviewing pressure and skin stretch with pt. MFR to Rt axilla only briefly today P/ROM to Rt shoulder only briefly into flexion, abduction, and D2 with good motion noted.   02/26/23: Manual Therapy MLD: In supine: Short neck, 5 diaphragmatic breaths, Lt axillary nodes, Lt intact anterior thorax, establishment of anterior inter-axillary anastomosis, Rt inguinal nodes and establishment of Rt axillo-inguinal anastomosis, then Rt breast focusing on areas of fibrosis and most fullness inferiorly and medial, moving fluid towards pathways then finished retracing all steps briefly reviewing pressure and skin stretch with pt. MFR to Rt axilla but no cording noted today P/ROM to Rt shoulder into flexion, abduction, and D2 with scapular depression by therapist throughout; end range tightness noted but able to achieve near full PROM  02/18/23: Manual Therapy MLD: In supine: Short neck, 5 diaphragmatic breaths, Lt axillary nodes, establishment of anterior interaxillary pathway, R inguinal nodes and establishment of Rt axilloinguinal pathway, then Rt breast  moving fluid towards pathways then finished retracing all steps. MFR to cording noticed today in Rt axilla  P/ROM to Rt shoulder into flexion and abduction with end range tightness noted but able to achieve full PROM  Educated pt to begin to wear compression bra daily  12/04/22: Manual Therapy MLD: In supine: Short neck, superficial and deep abdominals, Lt axillary nodes, Lt anterior intact upper quadrant sequence, establishment of anterior interaxillary pathway, R inguinal nodes and establishment of Rt axilloinguinal pathway, then Rt breast moving fluid towards pathways, then into Lt S/L for focus on lateral breast redirecting towards lateral anastomosis, then finished retracing all steps in supine. MFR to cording noticed today in Rt axilla and instructed pt in how to perform self MFR at home and explained what cording was. P/ROM to Rt shoulder into flexion, abd and D2 with scapular depression throughout A/ROM measurements taken for goal assess Cut and issued [employer] foam in stockinette (small dot) for pt to wear at inferior breast wear fibrosis palpated around incision       PATIENT EDUCATION:  Education details: HEP and lymphedema risk reduction Person educated: Patient Education method: Explanation Education comprehension: verbalized understanding  HOME EXERCISE PROGRAM: Reviewed previously given post op HEP.   ASSESSMENT:  CLINICAL IMPRESSION: Continued with Rt breast MLD. Fibrosis noted at inferior breast that does soften some by end of session.   Pt will benefit from skilled therapeutic intervention to improve on the following deficits: Decreased knowledge of precautions, impaired UE functional use, pain, decreased ROM, postural dysfunction.   PT treatment/interventions: ADL/Self care home management, Therapeutic exercises, Therapeutic activity, Patient/Family education, Self Care, Manual lymph drainage, Manual therapy, and Re-evaluation   GOALS: Goals reviewed with  patient? Yes  LONG TERM GOALS:  (STG=LTG)  GOALS Name Target Date  Goal status  1 Pt will demonstrate she has regained full shoulder ROM and function post operatively compared to baselines.  Baseline: 10/29/2022 ONGOING  2 Patient will report/demonstrate a >/= 50% reduction in right breast edema 10/29/2022 MET - 12/04/22:80-90% reported  3 Patient will verbalize good understanding of performing self manual lymph drainage for right breast swelling. 10/29/2022 MET  4 Pt will be independent in self MLD and able to independently manage R breast lymphedema to decrease risk of infection 03/18/23 NEW  5 Pt will be able to abduct and flex R shoulder to end range without R axillary tightness to allow improved comfort.  03/18/23 NEW  6 Pt will report a 50% improvement in R breast lymphedema as evidenced by decreased fibrosis and decreased pore size to allow improved comfort. 03/18/23 NEW     PLAN:  PT FREQUENCY/DURATION: 2x/week for 4 weeks  PLAN FOR NEXT SESSION: Need to order swell spot? New compression bra?? end range stretching, MFR to R axilla, Cont MLD to R breast and assess pt's technique and provide appropriate cues   [facility]  [address], [address]  [place] [address]  [phone]   Larue Saddie SAUNDERS, PT 03/01/2023, 12:05 PM

## 2023-03-04 ENCOUNTER — Inpatient Hospital Stay: Payer: 59 | Attending: Hematology and Oncology | Admitting: Adult Health

## 2023-03-04 ENCOUNTER — Encounter: Payer: Self-pay | Admitting: Adult Health

## 2023-03-04 VITALS — BP 124/91 | HR 79 | Temp 97.6°F | Ht 65.0 in | Wt 160.4 lb

## 2023-03-04 DIAGNOSIS — Z923 Personal history of irradiation: Secondary | ICD-10-CM | POA: Diagnosis not present

## 2023-03-04 DIAGNOSIS — Z1211 Encounter for screening for malignant neoplasm of colon: Secondary | ICD-10-CM | POA: Diagnosis not present

## 2023-03-04 DIAGNOSIS — Z79810 Long term (current) use of selective estrogen receptor modulators (SERMs): Secondary | ICD-10-CM | POA: Insufficient documentation

## 2023-03-04 DIAGNOSIS — Z1721 Progesterone receptor positive status: Secondary | ICD-10-CM | POA: Insufficient documentation

## 2023-03-04 DIAGNOSIS — C50511 Malignant neoplasm of lower-outer quadrant of right female breast: Secondary | ICD-10-CM | POA: Insufficient documentation

## 2023-03-04 DIAGNOSIS — Z87891 Personal history of nicotine dependence: Secondary | ICD-10-CM | POA: Diagnosis not present

## 2023-03-04 DIAGNOSIS — Z1732 Human epidermal growth factor receptor 2 negative status: Secondary | ICD-10-CM | POA: Insufficient documentation

## 2023-03-04 DIAGNOSIS — Z170 Estrogen receptor positive status [ER+]: Secondary | ICD-10-CM | POA: Insufficient documentation

## 2023-03-04 NOTE — Progress Notes (Signed)
 SURVIVORSHIP VISIT:  BRIEF ONCOLOGIC HISTORY:  Oncology History  Malignant neoplasm of lower-outer quadrant of right breast of female, estrogen receptor positive (HCC)  07/27/2022 Mammogram   Patient had screening mammogram recall, underwent diagnostic mammogram, this showed suspicious 4 mm lower right breast mass. No abnormal appearing right axillary nodes.   07/31/2022 Pathology Results   Right breast needle core biopsy showed IDC grade 2, ER 90% pos, strong staining, PR 95% positive strong staining, Her 2 neg.    Genetic Testing   [facility] Common Cancer Panel+RNA was Negative. Report date is 08/17/2022.  The Common Hereditary Cancers Panel offered by [facility] includes sequencing and/or deletion duplication testing of the following 48 genes: APC, ATM, AXIN2, BAP1, BARD1, BMPR1A, BRCA1, BRCA2, BRIP1, CDH1, CDK4, CDKN2A (p14ARF and p16INK4a only), CHEK2, CTNNA1, DICER1, EPCAM (Deletion/duplication testing only), FH, GREM1 (promoter region duplication testing only), HOXB13, KIT, MBD4, MEN1, MLH1, MSH2, MSH3, MSH6, MUTYH, NF1, NHTL1, PALB2, PDGFRA, PMS2, POLD1, POLE, PTEN, RAD51C, RAD51D, SDHA (sequencing analysis only except exon 14), SDHB, SDHC, SDHD, SMAD4, SMARCA4. STK11, TP53, TSC1, TSC2, and VHL.   08/08/2022 Cancer Staging   Staging form: Breast, AJCC 8th Edition - Clinical stage from 08/08/2022: Stage IA (cT1a, cN0, cM0, G2, ER+, PR+, HER2-) - Signed by Loretha Ash, MD on 01/21/2023 Stage prefix: Initial diagnosis Histologic grading system: 3 grade system   08/20/2022 Surgery   Right breast lumpectomy: IDC, 0.7cm, grade 2, margins negative, ER/PR +, HER2 Neg, Ki67 3%, 5 SLN negative   10/01/2022 - 11/15/2022 Radiation Therapy   Plan Name: Breast_R Site: Breast, Right Technique: 3D Mode: Photon Dose Per Fraction: 1.8 Gy Prescribed Dose (Delivered / Prescribed): 50.4 Gy / 50.4 Gy Prescribed Fxs (Delivered / Prescribed): 28 / 28   Plan Name: Breast_R_Bst Site: Breast, Right Technique:  3D Mode: Photon Dose Per Fraction: 2 Gy Prescribed Dose (Delivered / Prescribed): 10 Gy / 10 Gy Prescribed Fxs (Delivered / Prescribed): 5 / 5   10/2022 -  Anti-estrogen oral therapy   Tamoxifen      INTERVAL HISTORY:    Discussed the use of AI scribe software for clinical note transcription with the patient, who gave verbal consent to proceed.  [name] to review her survivorship care plan detailing her treatment course for breast cancer, as well as monitoring long-term side effects of that treatment, education regarding health maintenance, screening, and overall wellness and health promotion.     Overall, [name] is currently on tamoxifen  and reports no side effects. The patient underwent a lumpectomy, with five lymph nodes removed, all of which were negative. The patient is also undergoing physical therapy twice a week for lymphedema in the arm. The patient's last menstrual period was in August, and she has not had a period since then. The patient's husband had a vasectomy in March 2020.   REVIEW OF SYSTEMS:  Review of Systems  Constitutional:  Negative for appetite change, chills, fatigue, fever and unexpected weight change.  HENT:   Negative for hearing loss, lump/mass and trouble swallowing.   Eyes:  Negative for eye problems and icterus.  Respiratory:  Negative for chest tightness, cough and shortness of breath.   Cardiovascular:  Negative for chest pain, leg swelling and palpitations.  Gastrointestinal:  Negative for abdominal distention, abdominal pain, constipation, diarrhea, nausea and vomiting.  Endocrine: Negative for hot flashes.  Genitourinary:  Negative for difficulty urinating.   Musculoskeletal:  Negative for arthralgias.  Skin:  Negative for itching and rash.  Neurological:  Negative for dizziness, extremity weakness,  headaches and numbness.  Hematological:  Negative for adenopathy. Does not bruise/bleed easily.  Psychiatric/Behavioral:  Negative for depression.  The patient is not nervous/anxious.   Breast: Denies any new nodularity, masses, tenderness, nipple changes, or nipple discharge.       PAST MEDICAL/SURGICAL HISTORY:  Past Medical History:  Diagnosis Date   Abnormal Pap smear    Anxiety    Breast cancer (HCC)    Breast mass    left   Depression    GERD (gastroesophageal reflux disease)    Headache(784.0)    migraines   History of miscarriage    History of radiation therapy    Right breast- 10/01/22-11/15/22- Dr. Lynwood Nasuti   Hypertension    Incomplete RBBB    Palpitations    Syncope and collapse    Tachycardia    Vaginal Pap smear, abnormal    Past Surgical History:  Procedure Laterality Date   BREAST BIOPSY Right 07/31/2022   US  RT BREAST BX W LOC DEV 1ST LESION IMG BX SPEC US  GUIDE 07/31/2022 [facility] MAMMOGRAPHY   BREAST BIOPSY  08/16/2022   MM RT RADIOACTIVE SEED LOC MAMMO GUIDE 08/16/2022 [facility] MAMMOGRAPHY   BREAST ENHANCEMENT SURGERY  2012   BREAST LUMPECTOMY WITH RADIOACTIVE SEED AND SENTINEL LYMPH NODE BIOPSY Right 08/20/2022   Procedure: RIGHT BREAST LUMPECTOMY WITH RADIOACTIVE SEED AND SENTINEL LYMPH NODE BIOPSY;  Surgeon: Curvin Deward MOULD, MD;  Location: [facility];  Service: General;  Laterality: Right;  PEC BLOCK   CHOLECYSTECTOMY     DILATION AND CURETTAGE OF UTERUS  2008   LIPOMA EXCISION  2010   left leg   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   Bactrim Nausea And Vomiting   Erythromycin Other (See Comments)    Childhood reaction   Sulfa Drugs Cross Reactors Nausea And Vomiting     CURRENT MEDICATIONS:  Outpatient Encounter Medications as of 03/04/2023  Medication Sig   Cholecalciferol (VITAMIN D) 125 MCG (5000 UT) CAPS Take by mouth.   escitalopram (LEXAPRO) 20 MG tablet Take 1 tablet by mouth daily.   hydrochlorothiazide (HYDRODIURIL) 12.5 MG tablet Take 12.5 mg by mouth daily.   omeprazole (PRILOSEC) 20 MG capsule Take 20 mg by mouth daily.   propranolol (INDERAL) 20 MG tablet Take 20 mg by mouth 2 (two) times daily.   tamoxifen  (NOLVADEX ) 20 MG tablet Take 1 tablet (20 mg total) by mouth daily.   ibuprofen  (ADVIL ) 800 MG tablet Take 800 mg by mouth every 8 (eight) hours as needed. (Patient not taking: Reported on 03/04/2023)   LORazepam  (ATIVAN ) 1 MG tablet Take 1 mg by mouth as directed. (Patient not taking: Reported on 03/04/2023)   [DISCONTINUED] oxyCODONE  (ROXICODONE ) 5 MG immediate release tablet Take 1 tablet (5 mg total) by mouth every 6 (six) hours as needed for severe pain.   No facility-administered encounter medications on file as of  03/04/2023.     ONCOLOGIC FAMILY HISTORY:  Family History  Problem Relation Age of Onset   Hypertension Mother    Hyperlipidemia Mother    Heart disease Mother    COPD Mother    Heart attack Father        X2   Heart disease Father    Hypertension Father    Colon cancer Father 41   Crohn's disease Brother    COPD Maternal Grandmother    Diabetes Maternal Grandmother    Stomach cancer Neg Hx    Esophageal cancer Neg Hx      SOCIAL HISTORY:  Social History   Socioeconomic History   Marital status: Married    Spouse name: Not on file   Number of children: 1   Years of education: Not on file   Highest education level: Not on file  Occupational History    Employer: UNEMPLOYED   Tobacco Use   Smoking status: Former    Current packs/day: 0.25    Types: Cigarettes   Smokeless tobacco: Never  Vaping Use   Vaping status: Never Used  Substance and Sexual Activity   Alcohol use: No   Drug use: No   Sexual activity: Yes    Birth control/protection: None  Other Topics Concern   Not on file  Social History Narrative   Not on file   Social Drivers of Health   Financial Resource Strain: Not on file  Food Insecurity: No Food Insecurity (08/08/2022)   Hunger Vital Sign    Worried About Running Out of Food in the Last Year: Never true    Ran Out of Food in the Last Year: Never true  Transportation Needs: No Transportation Needs (08/08/2022)   PRAPARE - Administrator, Civil Service (Medical): No    Lack of Transportation (Non-Medical): No  Physical Activity: Not on file  Stress: Not on file  Social Connections: Not on file  Intimate Partner Violence: Not At Risk (08/08/2022)   Humiliation, Afraid, Rape, and Kick questionnaire    Fear of Current or Ex-Partner: No    Emotionally Abused: No    Physically Abused: No    Sexually Abused: No     OBSERVATIONS/OBJECTIVE:  BP (!) 124/91 (BP Location: Right Arm, Patient Position: Sitting) Comment: NP notified  Pulse 79   Temp 97.6 F (36.4 C) (Tympanic)   Ht 5' 5 (1.651 m)   Wt 160 lb 6.4 oz (72.8 kg)   SpO2 96%   BMI 26.69 kg/m  GENERAL: Patient is a well appearing female in no acute distress HEENT:  Sclerae anicteric.  Oropharynx clear and moist. No ulcerations or evidence of oropharyngeal candidiasis. Neck is supple.  NODES:  No cervical, supraclavicular, or axillary lymphadenopathy palpated.  BREAST EXAM:  Right breast s/p lumpectomy and radiation, no sign of local recurrence, left breast benign.   LUNGS:  Clear to auscultation bilaterally.  No wheezes or rhonchi. HEART:  Regular rate and rhythm. No murmur appreciated. ABDOMEN:  Soft, nontender.  Positive, normoactive bowel sounds. No  organomegaly palpated. MSK:  No focal spinal tenderness to palpation. Full range of motion bilaterally in the upper extremities. EXTREMITIES:  No peripheral edema.   SKIN:  Clear with no obvious rashes or skin changes. No nail dyscrasia. NEURO:  Nonfocal. Well oriented.  Appropriate affect.   LABORATORY DATA:  None for this visit.  DIAGNOSTIC IMAGING:  None for this visit.      ASSESSMENT AND PLAN:  Ms.. [name] is a  pleasant 48 y.o. female with Stage IA right breast invasive ductal carcinoma, ER+/PR+/HER2-, diagnosed in 07/2022, treated with lumpectomy, adjuvant radiation therapy, and anti-estrogen therapy with Tamoxifen  beginning in 10/2022.  She presents to the Survivorship Clinic for our initial meeting and routine follow-up post-completion of treatment for breast cancer.    1. Stage IA right breast cancer:  [name] is continuing to recover from definitive treatment for breast cancer. She will follow-up with her medical oncologist, Dr. Loretha in 6 months with history and physical exam per surveillance protocol.  She will continue her anti-estrogen therapy with Tamoxifen . Thus far, she is tolerating the Tamoxifen  well, with minimal side effects. Her mammogram is due 07/2023; orders placed today.   Today, a comprehensive survivorship care plan and treatment summary was reviewed with the patient today detailing her breast cancer diagnosis, treatment course, potential late/long-term effects of treatment, appropriate follow-up care with recommendations for the future, and patient education resources.  A copy of this summary, along with a letter will be sent to the patient's primary care provider via mail/fax/In Basket message after today's visit.    2. Bone health:  She was given education on specific activities to promote bone health.  3. Cancer screening:  Due to [name]'s history and her age, she should receive screening for skin cancers, colon cancer, and gynecologic cancers.  The information  and recommendations are listed on the patient's comprehensive care plan/treatment summary and were reviewed in detail with the patient.  I recommended a referral to [facility] for colonoscopy consult; referral orders placed today.   4. Health maintenance and wellness promotion: [name] was encouraged to consume 5-7 servings of fruits and vegetables per day. We reviewed the Nutrition Rainbow handout and [facility] pillars of health handout.  She was also encouraged to engage in moderate to vigorous exercise for 30 minutes per day most days of the week.  She was instructed to limit her alcohol consumption and continue to abstain from tobacco use.     5. Support services/counseling: It is not uncommon for this period of the patient's cancer care trajectory to be one of many emotions and stressors.   She was given information regarding our available services and encouraged to contact me with any questions or for help enrolling in any of our support group/programs.   6. Lymphedema: Continue PT twice a week.    7. Menstrual irregularities: Reports irregular periods, possibly related to Tamoxifen  use or perimenopause. -Continue follow-up with Gynecologist. -Report any heavy bleeding or other concerns.   Follow up instructions:    -Return to cancer center in 6 months for f/u with Dr. Loretha  -Mammogram due in 07/2023 -She is welcome to return back to the Survivorship Clinic at any time; no additional follow-up needed at this time.  -Consider referral back to survivorship as a long-term survivor for continued surveillance  The patient was provided an opportunity to ask questions and all were answered. The patient agreed with the plan and demonstrated an understanding of the instructions.   Total encounter time:45 minutes*in face-to-face visit time, chart review, lab review, care coordination, order entry, and documentation of the encounter time.    Morna Kendall, NP 03/04/23 10:45 AM Medical Oncology  and Hematology [facility] [address] [place], [place] [address] Tel. [phone]    Fax. [phone]  *Total Encounter Time as defined by the Centers for Medicare and Medicaid Services includes, in addition to the face-to-face time of a patient visit (documented in  the note above) non-face-to-face time: obtaining and reviewing outside history, ordering and reviewing medications, tests or procedures, care coordination (communications with other health care professionals or caregivers) and documentation in the medical record.

## 2023-03-06 ENCOUNTER — Encounter: Payer: Self-pay | Admitting: Pediatrics

## 2023-03-06 ENCOUNTER — Telehealth: Payer: Self-pay

## 2023-03-07 ENCOUNTER — Ambulatory Visit: Payer: 59

## 2023-03-07 NOTE — Telephone Encounter (Signed)
Enter in error

## 2023-03-11 DIAGNOSIS — Z13220 Encounter for screening for lipoid disorders: Secondary | ICD-10-CM | POA: Diagnosis not present

## 2023-03-11 DIAGNOSIS — K219 Gastro-esophageal reflux disease without esophagitis: Secondary | ICD-10-CM | POA: Diagnosis not present

## 2023-03-11 DIAGNOSIS — Z79899 Other long term (current) drug therapy: Secondary | ICD-10-CM | POA: Diagnosis not present

## 2023-03-11 DIAGNOSIS — G43909 Migraine, unspecified, not intractable, without status migrainosus: Secondary | ICD-10-CM | POA: Diagnosis not present

## 2023-03-11 DIAGNOSIS — Z23 Encounter for immunization: Secondary | ICD-10-CM | POA: Diagnosis not present

## 2023-03-11 DIAGNOSIS — I10 Essential (primary) hypertension: Secondary | ICD-10-CM | POA: Diagnosis not present

## 2023-03-11 DIAGNOSIS — R002 Palpitations: Secondary | ICD-10-CM | POA: Diagnosis not present

## 2023-03-11 DIAGNOSIS — C50911 Malignant neoplasm of unspecified site of right female breast: Secondary | ICD-10-CM | POA: Diagnosis not present

## 2023-03-11 DIAGNOSIS — F419 Anxiety disorder, unspecified: Secondary | ICD-10-CM | POA: Diagnosis not present

## 2023-03-14 ENCOUNTER — Ambulatory Visit: Payer: 59

## 2023-03-19 ENCOUNTER — Ambulatory Visit: Payer: 59

## 2023-03-19 DIAGNOSIS — M25611 Stiffness of right shoulder, not elsewhere classified: Secondary | ICD-10-CM

## 2023-03-19 DIAGNOSIS — R293 Abnormal posture: Secondary | ICD-10-CM

## 2023-03-19 DIAGNOSIS — Z483 Aftercare following surgery for neoplasm: Secondary | ICD-10-CM

## 2023-03-19 DIAGNOSIS — C50511 Malignant neoplasm of lower-outer quadrant of right female breast: Secondary | ICD-10-CM

## 2023-03-19 DIAGNOSIS — I890 Lymphedema, not elsewhere classified: Secondary | ICD-10-CM | POA: Diagnosis not present

## 2023-03-19 DIAGNOSIS — Z170 Estrogen receptor positive status [ER+]: Secondary | ICD-10-CM | POA: Diagnosis not present

## 2023-03-19 NOTE — Therapy (Signed)
OUTPATIENT PHYSICAL THERAPY BREAST CANCER TREATMENT   Patient Name: [name] MRN: [id number] DOB:Jul 15, 1975, 48 y.o., female Today's Date: 03/19/2023  END OF SESSION:  PT End of Session - 03/19/23 0907     Visit Number 14    Number of Visits 19    Date for PT Re-Evaluation 03/18/23    PT Start Time 0905    PT Stop Time 0959    PT Time Calculation (min) 54 min    Activity Tolerance Patient tolerated treatment well    Behavior During Therapy [facility] for tasks assessed/performed               Past Medical History:  Diagnosis Date   Abnormal Pap smear    Anxiety    Breast cancer (HCC)    Breast mass    left   Depression    GERD (gastroesophageal reflux disease)    Headache(784.0)    migraines   History of miscarriage    History of radiation therapy    Right breast- 10/01/22-11/15/22- Dr. Antony Blackbird   Hypertension    Incomplete RBBB    Palpitations    Syncope and collapse    Tachycardia    Vaginal Pap smear, abnormal    Past Surgical History:  Procedure Laterality Date   BREAST BIOPSY Right 07/31/2022   [place] RT BREAST BX W LOC DEV 1ST LESION IMG BX SPEC US GUIDE 07/31/2022 [facility]   BREAST BIOPSY  08/16/2022   MM RT RADIOACTIVE SEED LOC MAMMO GUIDE 08/16/2022 [facility]MO[facility]   BREAST ENHANCEMENT SURGERY  2012   BREAST LUMPECTOMY WITH RADIOACTIVE SEED AND SENTINEL LYMPH NODE BIOPSY Right 08/20/2022   Procedure: RIGHT BREAST LUMPECTOMY WITH RADIOACTIVE SEED AND SENTINEL LYMPH NODE BIOPSY;  Surgeon: Griselda Miner, MD;  Location: [facility];  Service: General;  Laterality: Right;  PEC BLOCK   CHOLECYSTECTOMY     DILATION AND CURETTAGE OF UTERUS  2008   LIPOMA EXCISION  2010   left leg   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   US ECHOCARDIOGRAPHY  06/17/2007   EF 55-60%   Patient Active Problem List   Diagnosis Date Noted   Genetic testing 08/16/2022   Family history of colon cancer in  father 08/08/2022   Malignant neoplasm of lower-outer quadrant of right breast of female, estrogen receptor positive (HCC) 08/06/2022   Preterm premature rupture of membranes 06/03/2017   Advanced maternal age in multigravida 12/20/2016   Abnormal chromosomal and genetic finding on antenatal screening of mother 12/20/2016   [redacted] weeks gestation of pregnancy    Inappropriate sinus tachycardia (HCC) 03/17/2014   Pericardial effusion 03/17/2014   Headache    Anxiety    Palpitations    Tachycardia     REFERRING PROVIDER: Dr. Chevis Pretty  REFERRING DIAG: Right breast cancer  THERAPY DIAG:  Lymphedema, not elsewhere classified  Stiffness of right shoulder, not elsewhere classified  Aftercare following surgery for neoplasm  Abnormal posture  Malignant neoplasm of lower-outer quadrant of right breast of female, estrogen receptor positive (HCC)  Rationale for Evaluation and Treatment: Rehabilitation  ONSET DATE: 08/20/2022  SUBJECTIVE:  SUBJECTIVE STATEMENT: My breast has been feeling a lot better lately. The swelling is a little better and the pain has gotten a lot better.   EVAL Patient reports she underwent a right lumpectomy and sentinel node biopsy (5 negative nodes) on 08/20/2022. Her Oncotype score was low so she will not need chemotherapy. She will proceed to radiation and anti-estrogen therapy.  Today is her first day of radiation.  PERTINENT HISTORY:  Patient was diagnosed on 07/19/2022 with right grade 2 invasive ductal carcinoma breast cancer. She underwent a right lumpectomy and sentinel node biopsy (5 negative nodes) on 08/20/2022. It is ER/PR positive and HER2 negative with a Ki67 of 3%. She had breast implants placed in 2012.   PATIENT GOALS:  Reassess how my recovery is going related to arm  function, pain, and swelling.  PAIN:  Are you having pain? No - just tightness and soreness  PRECAUTIONS: Recent Surgery, right UE Lymphedema risk  RED FLAGS: None   ACTIVITY LEVEL / LEISURE: She goes to the gym 3-4x/week, does 30 min of cardio and uses weight equipment. She also runs a chicken farm. Since surgery, she has just been walking.     OBJECTIVE:   OBSERVATIONS: 02/18/23: Lymphedema noted throughout R breast with increased pore size and fibrosis palpable Eval: Significant edema present right breast; see photo. Incisions have healed well with no signs of infection or redness.  POSTURE:  Forward head and rounded shoulders  LYMPHEDEMA ASSESSMENT:   UPPER EXTREMITY AROM/PROM:   A/PROM RIGHT   eval   RIGHT 10/01/2022 Right 12/04/22 RIGHT 02/18/23 Right 03/19/23  Shoulder extension 53 52 59    Shoulder flexion 151 167 174 175   Shoulder abduction 164 172 181 167 with pulling and tightness 186  Shoulder internal rotation 60 73 79    Shoulder external rotation 80 90  90                           (Blank rows = not tested)   A/PROM LEFT   eval  Shoulder extension 50  Shoulder flexion 150  Shoulder abduction 174  Shoulder internal rotation 62  Shoulder external rotation 85                          (Blank rows = not tested)   CERVICAL AROM: All within normal limits   UPPER EXTREMITY STRENGTH: WNL   LYMPHEDEMA ASSESSMENTS:    LANDMARK RIGHT   eval RIGHT 10/01/2022 RIGHT 02/18/23  10 cm proximal to olecranon process 28.1 29.4 27.5  Olecranon process 24.2 24.3 23  10  cm proximal to ulnar styloid process 22.4 22.3 20.8  Just proximal to ulnar styloid process 15.1 15.2 14.9  Across hand at thumb web space 16.8 16.9 16.7  At base of 2nd digit 5.8 5.6 5.7  (Blank rows = not tested)   LANDMARK LEFT   eval LEFT 10/01/2022  10 cm proximal to olecranon process 27.8 28  Olecranon process 23.1 23.2  10 cm proximal to ulnar styloid process 21 21.4  Just proximal  to ulnar styloid process 14.7 14.6  Across hand at thumb web space 16.7 16.7  At base of 2nd digit 5.9 5.8  (Blank rows = not tested)    Surgery type/Date: 08/20/2022 right lumpectomy and sentinel node biopsy Number of lymph nodes removed: 5 Current/past treatment (chemo, radiation, hormone therapy): none Other symptoms:  Heaviness/tightness No Pain No Pitting edema  No Infections No Decreased scar mobility No Stemmer sign No    Breast Complaints Scale: 21  TREATMENT PERFORMED: 03/19/23: Self Care Spent time showing pt pictures of different sized swell spots she should look into getting as this can help lessen any flare up of breast lymphedema. Printed handout for pt so she knows how she can order this online.  Manual Therapy MLD: In supine: Short neck, 5 diaphragmatic breaths, Lt axillary nodes, Lt intact anterior thorax, establishment of anterior inter-axillary anastomosis, Rt inguinal nodes and establishment of Rt axillo-inguinal anastomosis, then Rt breast focusing on areas of fibrosis and most fullness inferiorly and medial, moving fluid towards pathways then finished retracing all steps P/ROM to Rt shoulder only briefly into flexion, abduction, and D2 with good motion noted and no tightness palpable any longer in axilla.    03/01/23 Manual Therapy MLD: In supine: Short neck, 5 diaphragmatic breaths, Lt axillary nodes, Lt intact anterior thorax, establishment of anterior inter-axillary anastomosis, Rt inguinal nodes and establishment of Rt axillo-inguinal anastomosis, then Rt breast focusing on areas of fibrosis and most fullness inferiorly and medial, moving fluid towards pathways then finished retracing all steps briefly reviewing pressure and skin stretch with pt. MFR to Rt axilla only briefly today P/ROM to Rt shoulder only briefly into flexion, abduction, and D2 with good motion noted.   02/26/23: Manual Therapy MLD: In supine: Short neck, 5 diaphragmatic breaths, Lt axillary  nodes, Lt intact anterior thorax, establishment of anterior inter-axillary anastomosis, Rt inguinal nodes and establishment of Rt axillo-inguinal anastomosis, then Rt breast focusing on areas of fibrosis and most fullness inferiorly and medial, moving fluid towards pathways then finished retracing all steps briefly reviewing pressure and skin stretch with pt. MFR to Rt axilla but no cording noted today P/ROM to Rt shoulder into flexion, abduction, and D2 with scapular depression by therapist throughout; end range tightness noted but able to achieve near full PROM  02/18/23: Manual Therapy MLD: In supine: Short neck, 5 diaphragmatic breaths, Lt axillary nodes, establishment of anterior interaxillary pathway, R inguinal nodes and establishment of Rt axilloinguinal pathway, then Rt breast moving fluid towards pathways then finished retracing all steps. MFR to cording noticed today in Rt axilla  P/ROM to Rt shoulder into flexion and abduction with end range tightness noted but able to achieve full PROM  Educated pt to begin to wear compression bra daily  12/04/22: Manual Therapy MLD: In supine: Short neck, superficial and deep abdominals, Lt axillary nodes, Lt anterior intact upper quadrant sequence, establishment of anterior interaxillary pathway, R inguinal nodes and establishment of Rt axilloinguinal pathway, then Rt breast moving fluid towards pathways, then into Lt S/L for focus on lateral breast redirecting towards lateral anastomosis, then finished retracing all steps in supine. MFR to cording noticed today in Rt axilla and instructed pt in how to perform self MFR at home and explained what cording was. P/ROM to Rt shoulder into flexion, abd and D2 with scapular depression throughout A/ROM measurements taken for goal assess Cut and issued [employer] foam in stockinette (small dot) for pt to wear at inferior breast wear fibrosis palpated around incision       PATIENT EDUCATION:  Education  details: How to order a breast swell spot Person educated: Patient Education method: Explanationand handout issued Education comprehension: verbalized understanding  HOME EXERCISE PROGRAM: Reviewed previously given post op HEP.   ASSESSMENT:  CLINICAL IMPRESSION: D/C this session. Pt has met all goals and made excellent progress. Her Rt axillary tightness is  much improved and pt reports no longer feeling any pull with end A/ROM of Lt shoulder. Her breast fibrosis is also much improved. Educated her on how to order a swell spot online and issued handout for this. Pt is ready for D/C at this time.   Pt will benefit from skilled therapeutic intervention to improve on the following deficits: Decreased knowledge of precautions, impaired UE functional use, pain, decreased ROM, postural dysfunction.   PT treatment/interventions: ADL/Self care home management, Therapeutic exercises, Therapeutic activity, Patient/Family education, Self Care, Manual lymph drainage, Manual therapy, and Re-evaluation   GOALS: Goals reviewed with patient? Yes  LONG TERM GOALS:  (STG=LTG)  GOALS Name Target Date  Goal status  1 Pt will demonstrate she has regained full shoulder ROM and function post operatively compared to baselines.  Baseline: 10/29/2022 ONGOING  2 Patient will report/demonstrate a >/= 50% reduction in right breast edema 10/29/2022 MET - 12/04/22:80-90% reported  3 Patient will verbalize good understanding of performing self manual lymph drainage for right breast swelling. 10/29/2022 MET  4 Pt will be independent in self MLD and able to independently manage R breast lymphedema to decrease risk of infection 03/18/23 MET  5 Pt will be able to abduct and flex R shoulder to end range without R axillary tightness to allow improved comfort.  03/18/23 MET 03/19/23 - no tightness reported any longer and full A/ROM  6 Pt will report a 50% improvement in R breast lymphedema as evidenced by decreased fibrosis and  decreased pore size to allow improved comfort. 03/18/23 MET  03/19/23 - pt reports 80%      PLAN:  PT FREQUENCY/DURATION: 2x/week for 4 weeks  PLAN FOR NEXT SESSION: D/C this visit   [facility]  [address]  [place] [address]  [phone]   Hermenia Bers, PTA 03/19/2023, 11:03 AM  PHYSICAL THERAPY DISCHARGE SUMMARY  Visits from Start of Care: 14  Current functional level related to goals / functional outcomes: See above   Remaining deficits: Lymphedema risk   Education / Equipment: Final HEP  Plan: Patient agrees to discharge.  Patient is being discharged due to meeting the stated rehab goals.

## 2023-03-21 ENCOUNTER — Ambulatory Visit: Payer: 59

## 2023-03-22 ENCOUNTER — Encounter: Payer: Self-pay | Admitting: Physician Assistant

## 2023-03-22 ENCOUNTER — Ambulatory Visit: Payer: 59 | Admitting: Physician Assistant

## 2023-03-22 VITALS — BP 110/70 | HR 90 | Ht 65.0 in | Wt 159.0 lb

## 2023-03-22 DIAGNOSIS — K625 Hemorrhage of anus and rectum: Secondary | ICD-10-CM | POA: Diagnosis not present

## 2023-03-22 DIAGNOSIS — K5900 Constipation, unspecified: Secondary | ICD-10-CM | POA: Diagnosis not present

## 2023-03-22 NOTE — Progress Notes (Signed)
Chief Complaint: Blood in stools  HPI:    [name] is a 48 year old female, previously known to Dr. Christella Hartigan for fatty liver, with a past medical history as listed below including reflux and breast cancer, who is completed chemotherapy, who was referred to me by Hamrick, Durward Fortes, MD for a complaint of blood in stools.      Today, the patient tells me that she typically her bowel movements are somewhat erratic, can she can have one every day or maybe every other day or maybe every 2 days.  It has been this way her whole life.  Most recently she is on Tamoxifen which she was told by the pharmacist could cause some constipation and has noted that she has trended that way.  On 2 separate occasions, the first about 2 weeks ago she was straining for a bowel movement for about 2 hours before she used a suppository and then saw some bright red blood on the stool.  She then had a normal bowel movement for a few days and then had another bloody bowel movement after straining again.  At the times of bloody bowel movements she did have some sharp rectal pain, no longer experiencing the symptoms and has not seen blood over the past couple of weeks.    Denies fever, chills or weight loss.  Past Medical History:  Diagnosis Date   Abnormal Pap smear    Anxiety    Breast cancer (HCC)    Breast mass    left   Depression    GERD (gastroesophageal reflux disease)    Headache(784.0)    migraines   History of miscarriage    History of radiation therapy    Right breast- 10/01/22-11/15/22- Dr. Antony Blackbird   Hypertension    Incomplete RBBB    Palpitations    Syncope and collapse    Tachycardia    Vaginal Pap smear, abnormal     Past Surgical History:  Procedure Laterality Date   BREAST BIOPSY Right 07/31/2022   [place] RT BREAST BX W LOC DEV 1ST LESION IMG BX SPEC US GUIDE 07/31/2022 [facility]   BREAST BIOPSY  08/16/2022   MM RT RADIOACTIVE SEED LOC MAMMO GUIDE 08/16/2022 [facility]MO[facility]APHY   BREAST  ENHANCEMENT SURGERY  2012   BREAST LUMPECTOMY WITH RADIOACTIVE SEED AND SENTINEL LYMPH NODE BIOPSY Right 08/20/2022   Procedure: RIGHT BREAST LUMPECTOMY WITH RADIOACTIVE SEED AND SENTINEL LYMPH NODE BIOPSY;  Surgeon: Griselda Miner, MD;  Location:  S[facility]ERY [facility];  Service: General;  Laterality: Right;  PEC BLOCK   CHOLECYSTECTOMY     DILATION AND CURETTAGE OF UTERUS  2008   LIPOMA EXCISION  2010   left leg   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     escitalopram (LEXAPRO) 20 MG tablet Take 1 tablet by mouth daily.     hydrochlorothiazide (HYDRODIURIL) 12.5 MG tablet Take 12.5 mg by mouth daily.     ibuprofen (ADVIL) 800 MG tablet Take 800 mg by mouth every 8 (eight) hours as needed.     LORazepam (ATIVAN) 1 MG tablet Take 1 mg by mouth as directed.     omeprazole (PRILOSEC) 20 MG capsule Take 20 mg by mouth daily.     OVER THE  COUNTER MEDICATION Pt taking  semaglutide injection weekly     propranolol (INDERAL) 20 MG tablet Take 20 mg by mouth 2 (two) times daily.     tamoxifen (NOLVADEX) 20 MG tablet Take 1 tablet (20 mg total) by mouth daily. 90 tablet 3   No current facility-administered medications for this visit.    Allergies as of 03/22/2023 - Review Complete 03/22/2023  Allergen Reaction Noted   Amoxicillin Hives 03/28/2015   Bactrim Nausea And Vomiting 10/25/2010   Erythromycin Other (See Comments) 10/25/2010   Sulfa drugs cross reactors Nausea And Vomiting 11/03/2010    Family History  Problem Relation Age of Onset   Hypertension Mother    Hyperlipidemia Mother    Heart disease Mother    COPD Mother    Heart attack Father        X2   Heart disease Father    Hypertension Father    Colon cancer Father 53   Crohn's disease Brother     COPD Maternal Grandmother    Diabetes Maternal Grandmother    Stomach cancer Neg Hx    Esophageal cancer Neg Hx     Social History   Socioeconomic History   Marital status: Married    Spouse name: Not on file   Number of children: 1   Years of education: Not on file   Highest education level: Not on file  Occupational History    Employer: UNEMPLOYED  Tobacco Use   Smoking status: Former    Current packs/day: 0.25    Types: Cigarettes   Smokeless tobacco: Never  Vaping Use   Vaping status: Never Used  Substance and Sexual Activity   Alcohol use: No   Drug use: No   Sexual activity: Yes    Birth control/protection: None, Other-see comments    Comment: husband s/p vasectomy in 2020  Other Topics Concern   Not on file  Social History Narrative   Not on file   Social Drivers of Health   Financial Resource Strain: Not on file  Food Insecurity: No Food Insecurity (08/08/2022)   Hunger Vital Sign    Worried About Running Out of Food in the Last Year: Never true    Ran Out of Food in the Last Year: Never true  Transportation Needs: No Transportation Needs (08/08/2022)   PRAPARE - Administrator, Civil Service (Medical): No    Lack of Transportation (Non-Medical): No  Physical Activity: Not on file  Stress: Not on file  Social Connections: Not on file  Intimate Partner Violence: Not At Risk (08/08/2022)   Humiliation, Afraid, Rape, and Kick questionnaire    Fear of Current or Ex-Partner: No    Emotionally Abused: No    Physically Abused: No    Sexually Abused: No    Review of Systems:    Constitutional: No weight loss, fever or chills Skin: No rash  Cardiovascular: No chest pain Respiratory: No SOB  Gastrointestinal: See HPI and otherwise negative Genitourinary: No dysuria  Neurological: No headache, dizziness or syncope Musculoskeletal: No new muscle or joint pain Hematologic: No bruising Psychiatric: No history of depression or anxiety   Physical  Exam:  Vital signs: BP 110/70   Pulse 90   Ht 5\' 5"  (1.651 m)   Wt 159 lb (72.1 kg)   LMP 09/20/2022 (Approximate)   BMI 26.46 kg/m    Constitutional:   Pleasant Caucasian female appears to be in NAD, Well developed, Well nourished, alert and cooperative Head:  Normocephalic  and atraumatic. Eyes:   PEERL, EOMI. No icterus. Conjunctiva pink. Ears:  Normal auditory acuity. Neck:  Supple Throat: Oral cavity and pharynx without inflammation, swelling or lesion.  Respiratory: Respirations even and unlabored. Lungs clear to auscultation bilaterally.   No wheezes, crackles, or rhonchi.  Cardiovascular: Normal S1, S2. No MRG. Regular rate and rhythm. No peripheral edema, cyanosis or pallor.  Gastrointestinal:  Soft, nondistended, nontender. No rebound or guarding. Normal bowel sounds. No appreciable masses or hepatomegaly. Rectal: External: No fissure, no hemorrhoids; internal: No mass or fullness, no residue Msk:  Symmetrical without gross deformities. Without edema, no deformity or joint abnormality.  Neurologic:  Alert and  oriented x4;  grossly normal neurologically.  Skin:   Dry and intact without significant lesions or rashes. Psychiatric: Demonstrates good judgement and reason without abnormal affect or behaviors.  RELEVANT LABS AND IMAGING: CBC    Component Value Date/Time   WBC 8.1 08/08/2022 0827   WBC 8.9 05/17/2021 1100   RBC 4.90 08/08/2022 0827   HGB 15.9 (H) 08/08/2022 0827   HCT 44.3 08/08/2022 0827   PLT 391 08/08/2022 0827   MCV 90.4 08/08/2022 0827   MCH 32.4 08/08/2022 0827   MCHC 35.9 08/08/2022 0827   RDW 11.9 08/08/2022 0827   LYMPHSABS 2.1 08/08/2022 0827   MONOABS 0.5 08/08/2022 0827   EOSABS 0.1 08/08/2022 0827   BASOSABS 0.0 08/08/2022 0827    CMP     Component Value Date/Time   NA 137 08/08/2022 0827   NA 138 04/18/2021 1207   K 3.8 08/08/2022 0827   CL 99 08/08/2022 0827   CO2 31 08/08/2022 0827   GLUCOSE 100 (H) 08/08/2022 0827   BUN 14  08/08/2022 0827   BUN 16 04/18/2021 1207   CREATININE 0.86 08/08/2022 0827   CALCIUM 9.9 08/08/2022 0827   PROT 7.4 08/08/2022 0827   ALBUMIN 4.6 08/08/2022 0827   AST 19 08/08/2022 0827   ALT 19 08/08/2022 0827   ALKPHOS 56 08/08/2022 0827   BILITOT 1.1 08/08/2022 0827   GFRNONAA >60 08/08/2022 0827   GFRAA >60 06/04/2017 0526    Assessment: 1.  Rectal bleeding: On 2 separate occasions with a hard to pass stool, did have some rectal pain at the time, rectal exam benign/normal today; likely anal fissure versus hemorrhoid 2.  Screening for colorectal cancer: Patient is now 11 and due for screening  Plan: 1.  Scheduled patient for a screening colonoscopy in the [facility] with Dr. Tomasa Rand.  Did provide the patient with a detailed list of risks for the procedure and she agrees to proceed. Patient is appropriate for endoscopic procedure(s) in the ambulatory ([facility]) setting.  2.  Discussed with patient that most likely what she had was either a fissure given the pain she was having at the time or a hemorrhoid.  Again her exam is normal today. 3.  Patient will have a 2-day bowel prep given history of constipation. 4.  Recommend the patient start MiraLAX daily.  Hyacinth Meeker, PA-C [facility] 03/22/2023, 1:41 PM  Cc: [name] Ravel, MD

## 2023-03-22 NOTE — Patient Instructions (Signed)
Start Miralax 1 capful daily in 8 ounces of liquid.  You have been scheduled for a colonoscopy. Please follow written instructions given to you at your visit today.   Please pick up your prep supplies at the pharmacy within the next 1-3 days.  If you use inhalers (even only as needed), please bring them with you on the day of your procedure.  DO NOT TAKE 7 DAYS PRIOR TO TEST- Trulicity (dulaglutide) Ozempic, Wegovy (semaglutide) Mounjaro (tirzepatide) Bydureon Bcise (exanatide extended release)  DO NOT TAKE 1 DAY PRIOR TO YOUR TEST Rybelsus (semaglutide) Adlyxin (lixisenatide) Victoza (liraglutide) Byetta (exanatide) ___________________________________________________________________________  _______________________________________________________  If your blood pressure at your visit was 140/90 or greater, please contact your primary care physician to follow up on this.  _______________________________________________________  If you are age 62 or older, your body mass index should be between 23-30. Your Body mass index is 26.46 kg/m. If this is out of the aforementioned range listed, please consider follow up with your Primary Care Provider.  If you are age 65 or younger, your body mass index should be between 19-25. Your Body mass index is 26.46 kg/m. If this is out of the aformentioned range listed, please consider follow up with your Primary Care Provider.   ________________________________________________________  The [facility] providers would like to encourage you to use [facility] to communicate with providers for non-urgent requests or questions.  Due to long hold times on the telephone, sending your provider a message by [facility] may be a faster and more efficient way to get a response.  Please allow 48 business hours for a response.  Please remember that this is for non-urgent requests.  _______________________________________________________

## 2023-03-25 ENCOUNTER — Encounter: Payer: Self-pay | Admitting: Gastroenterology

## 2023-03-25 NOTE — Progress Notes (Signed)
Agree with the assessment and plan as outlined by Hyacinth Meeker, PA-C.  Bleeding very consistent with benign anorectal source.  Agree with daily MiraLax or fiber supplement.  Patient needs colonoscopy for colon cancer screening.  Tabb Croghan E. Tomasa Rand, MD

## 2023-03-26 ENCOUNTER — Encounter: Payer: Self-pay | Admitting: Gastroenterology

## 2023-03-26 ENCOUNTER — Ambulatory Visit (AMBULATORY_SURGERY_CENTER): Payer: 59 | Admitting: Gastroenterology

## 2023-03-26 VITALS — BP 125/71 | HR 92 | Temp 98.0°F | Resp 13 | Ht 66.0 in | Wt 159.0 lb

## 2023-03-26 DIAGNOSIS — K640 First degree hemorrhoids: Secondary | ICD-10-CM | POA: Diagnosis not present

## 2023-03-26 DIAGNOSIS — K5730 Diverticulosis of large intestine without perforation or abscess without bleeding: Secondary | ICD-10-CM | POA: Diagnosis not present

## 2023-03-26 DIAGNOSIS — Z1211 Encounter for screening for malignant neoplasm of colon: Secondary | ICD-10-CM | POA: Diagnosis not present

## 2023-03-26 DIAGNOSIS — K625 Hemorrhage of anus and rectum: Secondary | ICD-10-CM

## 2023-03-26 DIAGNOSIS — D123 Benign neoplasm of transverse colon: Secondary | ICD-10-CM

## 2023-03-26 MED ORDER — SODIUM CHLORIDE 0.9 % IV SOLN: 500.0000 mL | Freq: Once | INTRAVENOUS | Status: DC

## 2023-03-26 NOTE — Op Note (Signed)
 [facility] Patient Name: [name] Procedure Date: 03/26/2023 2:08 PM MRN: [id number] Endoscopist: Glendia E. Stacia , MD, [id number] Age: 48 Referring MD:  Date of Birth: 06-28-1975 Gender: Female Account #: [id number] Procedure:                Colonoscopy Indications:              Screening for colorectal malignant neoplasm, This                            is the patient's first colonoscopy Medicines:                Monitored Anesthesia Care Procedure:                Pre-Anesthesia Assessment:                           - Prior to the procedure, a History and Physical                            was performed, and patient medications and                            allergies were reviewed. The patient's tolerance of                            previous anesthesia was also reviewed. The risks                            and benefits of the procedure and the sedation                            options and risks were discussed with the patient.                            All questions were answered, and informed consent                            was obtained. Prior Anticoagulants: The patient has                            taken no anticoagulant or antiplatelet agents. ASA                            Grade Assessment: II - A patient with mild systemic                            disease. After reviewing the risks and benefits,                            the patient was deemed in satisfactory condition to                            undergo the procedure.  After obtaining informed consent, the colonoscope                            was passed under direct vision. Throughout the                            procedure, the patient's blood pressure, pulse, and                            oxygen saturations were monitored continuously. The                            CF [id number] #[id number] was introduced through the anus                            and advanced to  the the cecum, identified by                            appendiceal orifice and ileocecal valve. The                            colonoscopy was performed without difficulty. The                            patient tolerated the procedure well. The quality                            of the bowel preparation was excellent. The                            ileocecal valve, appendiceal orifice, and rectum                            were photographed. The bowel preparation used was                            Suflave via split dose instruction. Scope In: 2:17:05 PM Scope Out: 2:29:38 PM Scope Withdrawal Time: 0 hours 7 minutes 47 seconds  Total Procedure Duration: 0 hours 12 minutes 33 seconds  Findings:                 The perianal and digital rectal examinations were                            normal. Pertinent negatives include normal                            sphincter tone and no palpable rectal lesions.                           A 5 mm polyp was found in the distal transverse                            colon. The polyp was sessile. The polyp was  removed                            with a cold snare. Resection and retrieval were                            complete. Estimated blood loss was minimal.                           A few small-mouthed diverticula were found in the                            sigmoid colon.                           The exam was otherwise normal throughout the                            examined colon.                           Non-bleeding internal hemorrhoids were found during                            retroflexion. The hemorrhoids were Grade I                            (internal hemorrhoids that do not prolapse).                           No additional abnormalities were found on                            retroflexion. Complications:            No immediate complications. Estimated Blood Loss:     Estimated blood loss was minimal. Impression:               -  One 5 mm polyp in the distal transverse colon,                            removed with a cold snare. Resected and retrieved.                           - Mild diverticulosis in the sigmoid colon.                           - Non-bleeding internal hemorrhoids. Recommendation:           - Patient has a contact number available for                            emergencies. The signs and symptoms of potential                            delayed complications were discussed with the  patient. Return to normal activities tomorrow.                            Written discharge instructions were provided to the                            patient.                           - Resume previous diet.                           - Continue present medications.                           - Await pathology results.                           - Repeat colonoscopy (date not yet determined) for                            surveillance based on pathology results. Virgie Kunda E. Stacia, MD 03/26/2023 2:35:43 PM This report has been signed electronically.

## 2023-03-26 NOTE — Progress Notes (Signed)
 History and Physical Interval Note:  03/26/2023 1:35 PM  [name]  has presented today for endoscopic procedure(s), with the diagnosis of  Encounter Diagnosis  Name Primary?   Rectal bleeding Yes  .  The various methods of evaluation and treatment have been discussed with the patient and/or family. After consideration of risks, benefits and other options for treatment, the patient has consented to  the endoscopic procedure(s).   The patient's history has been reviewed, patient examined, no change in status, stable for endoscopic procedure(s).  I have reviewed the patient's chart and labs.  Questions were answered to the patient's satisfaction.     Ty Oshima E. Stacia, MD [facility]

## 2023-03-26 NOTE — Progress Notes (Signed)
 Called to procedure room to assist with colonoscopy.

## 2023-03-26 NOTE — Patient Instructions (Signed)

## 2023-03-26 NOTE — Progress Notes (Signed)
 Sedate, gd SR, tolerated procedure well, VSS, report to RN

## 2023-03-27 ENCOUNTER — Telehealth: Payer: Self-pay | Admitting: *Deleted

## 2023-03-27 NOTE — Telephone Encounter (Signed)
  Follow up Call-     03/26/2023    1:19 PM  Call back number  Post procedure Call Back phone  # [phone]  Permission to leave phone message Yes     Patient questions:  Do you have a fever, pain , or abdominal swelling? No. Pain Score  0 *  Have you tolerated food without any problems? Yes.    Have you been able to return to your normal activities? Yes.    Do you have any questions about your discharge instructions: Diet   No. Medications  No. Follow up visit  No.  Do you have questions or concerns about your Care? No.  Actions: * If pain score is 4 or above: No action needed, pain <4.

## 2023-03-29 LAB — SURGICAL PATHOLOGY

## 2023-04-01 ENCOUNTER — Encounter: Payer: Self-pay | Admitting: Gastroenterology

## 2023-04-01 NOTE — Progress Notes (Signed)
 [name],  The polyp which I removed during your recent procedure was proven to be completely benign but is considered a pre-cancerous polyp that MAY have grown into cancer if it had not been removed.  Studies shows that at least 20% of women over age 46 and 30% of men over age 63 have pre-cancerous polyps.  Based on current nationally recognized surveillance guidelines, I recommend that you have a repeat colonoscopy in 7 years.   If you develop any new rectal bleeding, abdominal pain or significant bowel habit changes, please contact me before then.

## 2023-04-04 DIAGNOSIS — R300 Dysuria: Secondary | ICD-10-CM | POA: Diagnosis not present

## 2023-04-08 ENCOUNTER — Encounter: Payer: 59 | Admitting: Pediatrics

## 2023-04-22 ENCOUNTER — Encounter: Payer: Self-pay | Admitting: Hematology and Oncology

## 2023-05-03 DIAGNOSIS — R102 Pelvic and perineal pain: Secondary | ICD-10-CM | POA: Diagnosis not present

## 2023-06-29 DIAGNOSIS — R300 Dysuria: Secondary | ICD-10-CM | POA: Diagnosis not present

## 2023-06-29 DIAGNOSIS — R350 Frequency of micturition: Secondary | ICD-10-CM | POA: Diagnosis not present

## 2023-06-29 DIAGNOSIS — N390 Urinary tract infection, site not specified: Secondary | ICD-10-CM | POA: Diagnosis not present

## 2023-07-01 ENCOUNTER — Ambulatory Visit: Payer: 59

## 2023-07-05 ENCOUNTER — Ambulatory Visit: Attending: General Surgery

## 2023-07-05 VITALS — Wt 154.1 lb

## 2023-07-05 DIAGNOSIS — Z483 Aftercare following surgery for neoplasm: Secondary | ICD-10-CM

## 2023-07-05 NOTE — Therapy (Signed)
 OUTPATIENT PHYSICAL THERAPY SOZO SCREENING NOTE   Patient Name: [name] MRN: [id number] DOB:12-13-1975, 48 y.o., female Today's Date: 07/05/2023  PCP: Annette Barters, MD REFERRING PROVIDER: Caralyn Chandler, MD   PT End of Session - 07/05/23 [phone]     Visit Number 14   # unchanged due to screen only   PT Start Time 0904    PT Stop Time 0908    PT Time Calculation (min) 4 min    Activity Tolerance Patient tolerated treatment well    Behavior During Therapy [facility] for tasks assessed/performed             Past Medical History:  Diagnosis Date   Abnormal Pap smear    Anxiety    Breast cancer (HCC)    Breast mass    left   Depression    GERD (gastroesophageal reflux disease)    Headache(784.0)    migraines   History of miscarriage    History of radiation therapy    Right breast- 10/01/22-11/15/22- Dr. Retta Caster   Hypertension    Incomplete RBBB    Palpitations    Syncope and collapse    Tachycardia    Vaginal Pap smear, abnormal    Past Surgical History:  Procedure Laterality Date   BREAST BIOPSY Right 07/31/2022   US  RT BREAST BX W LOC DEV 1ST LESION IMG BX SPEC US  GUIDE 07/31/2022 [facility]   BREAST BIOPSY  08/16/2022   MM RT RADIOACTIVE SEED LOC MAMMO GUIDE 08/16/2022 [facility]   BREAST ENHANCEMENT SURGERY  2012   BREAST LUMPECTOMY WITH RADIOACTIVE SEED AND SENTINEL LYMPH NODE BIOPSY Right 08/20/2022   Procedure: RIGHT BREAST LUMPECTOMY WITH RADIOACTIVE SEED AND SENTINEL LYMPH NODE BIOPSY;  Surgeon: Lillette Reid III, MD;  Location: [facility];  Service: General;  Laterality: Right;  PEC BLOCK   CHOLECYSTECTOMY     DILATION AND CURETTAGE OF UTERUS  2008   LIPOMA EXCISION  2010   left leg   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   US  ECHOCARDIOGRAPHY  06/17/2007   EF 55-60%   Patient Active Problem List   Diagnosis Date Noted   Genetic testing 08/16/2022   Family history of colon  cancer in father 08/08/2022   Malignant neoplasm of lower-outer quadrant of right breast of female, estrogen receptor positive (HCC) 08/06/2022   Preterm premature rupture of membranes 06/03/2017   Advanced maternal age in multigravida 12/20/2016   Abnormal chromosomal and genetic finding on antenatal screening of mother 12/20/2016   [redacted] weeks gestation of pregnancy    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 She underwent a right lumpectomy and sentinel node biopsy (5 negative nodes) on 08/20/2022. It is ER/PR positive and HER2 negative with a Ki67 of 3%. She had breast implants placed in 2012  PRECAUTIONS: right UE Lymphedema risk, None  SUBJECTIVE: Pt returns for her 3 month L-Dex screen. "I just started going back to the gym."  PAIN:  Are you having pain? No  SOZO SCREENING: Patient was assessed today using the SOZO machine to determine the lymphedema index score. This was compared to her  baseline score. It was determined that she is within the recommended range when compared to her baseline and no further action is needed at this time. She will continue SOZO screenings. These are done every 3 months for 2 years post operatively followed by every 6 months for 2 years, and then annually.  Since pt was right on the mark for having subclinical lymphedema and she's just started going back to the gym educated her about pulling back a little on weights and reps at the gym and being mindful of highly repetitive activities as well. Pt able to verbalize good understanding and agrees to come back in 1 month for follow up screen.    L-DEX FLOWSHEETS - 07/05/23 0900        L-DEX LYMPHEDEMA SCREENING   Measurement Type Unilateral    L-DEX MEASUREMENT EXTREMITY Upper Extremity    POSITION  Standing    DOMINANT SIDE Right    At Risk Side Right    BASELINE SCORE (UNILATERAL) -1.6    L-DEX SCORE (UNILATERAL) 4.9    VALUE CHANGE (UNILAT) 6.5               [name], PTA 07/05/2023, 9:14 AM

## 2023-07-22 ENCOUNTER — Ambulatory Visit
Admission: RE | Admit: 2023-07-22 | Discharge: 2023-07-22 | Disposition: A | Discharge status: Home / Self Care | Payer: 59 | Source: Ambulatory Visit | Attending: Adult Health | Admitting: Adult Health

## 2023-07-22 DIAGNOSIS — Z170 Estrogen receptor positive status [ER+]: Secondary | ICD-10-CM

## 2023-07-22 DIAGNOSIS — Z9889 Other specified postprocedural states: Secondary | ICD-10-CM | POA: Diagnosis not present

## 2023-07-22 DIAGNOSIS — Z853 Personal history of malignant neoplasm of breast: Secondary | ICD-10-CM | POA: Diagnosis not present

## 2023-07-27 IMAGING — CT CT CARDIAC CORONARY ARTERY CALCIUM SCORE
3 series · 14 of 20 positions shown, 16 images · non-contrast
Comparison: None.
COMPARISON: None.

Addendum:
EXAM:
OVER-READ INTERPRETATION  CT CHEST

The following report is an over-read performed by radiologist Dr.
Keranovic Diana [REDACTED] on 05/11/2021. This
over-read does not include interpretation of cardiac or coronary
anatomy or pathology. The coronary calcium score interpretation by
the cardiologist is attached.
CLINICAL DATA: Cardiovascular Disease Risk stratification
Coronary Calcium Score
TECHNIQUE: A gated, non-contrast computed tomography scan of the heart was
performed using 3mm slice thickness. Axial images were analyzed on a
dedicated workstation. Calcium scoring of the coronary arteries was
performed using the Agatston method.

[Series 2: cascseq 2.0 sa36 (id) (id) · axial · 0.39mm/px · z∈[-247,-167]mm · 4 of 68 slices shown]
[im 14/68  vessel]
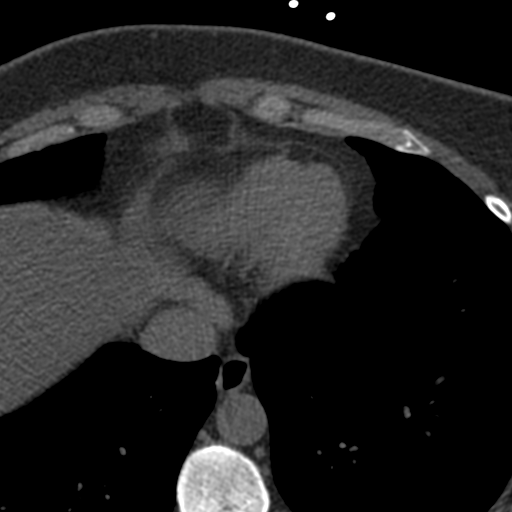
[im 27/68  vessel]
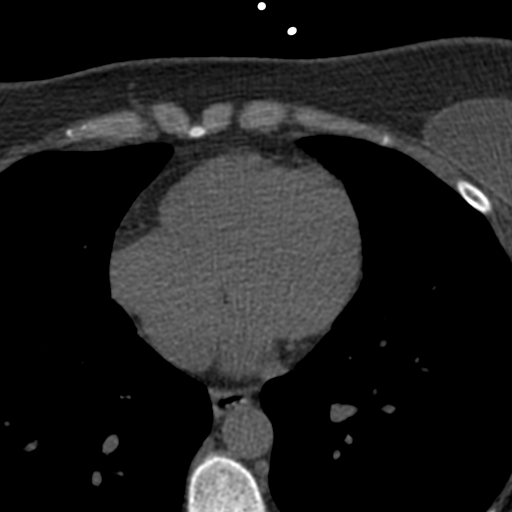
[im 41/68  vessel]
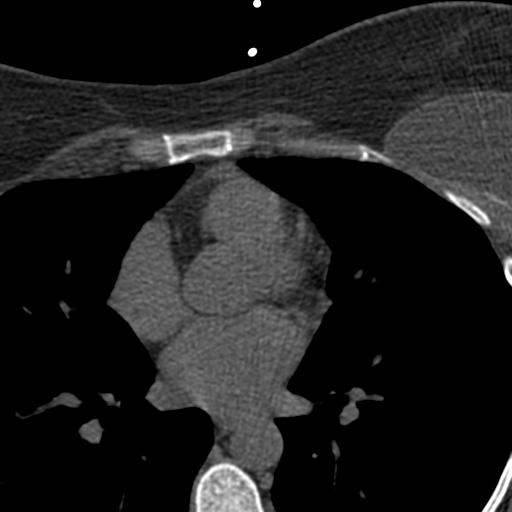
[im 54/68  vessel]
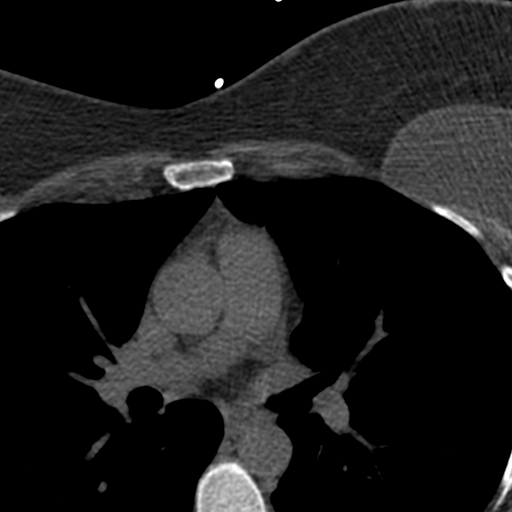

[Series 3: cascseq 2.0 bf37 st · axial · 0.65mm/px · z∈[-251,-163]mm · 5 of 68 slices shown, 7 images]
[im 12/68  vessel]
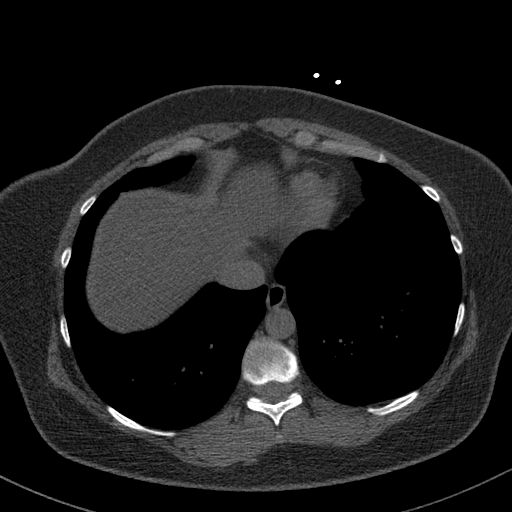
[im 12/68  lung]
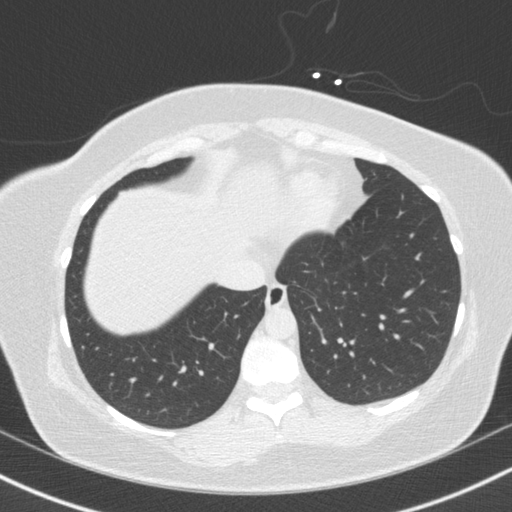
[im 23/68  vessel]
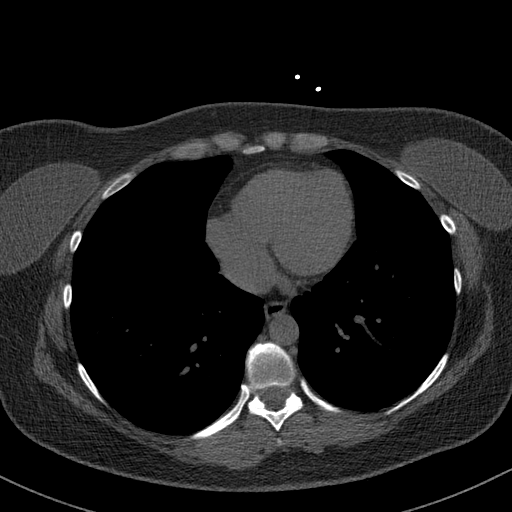
[im 34/68  vessel]
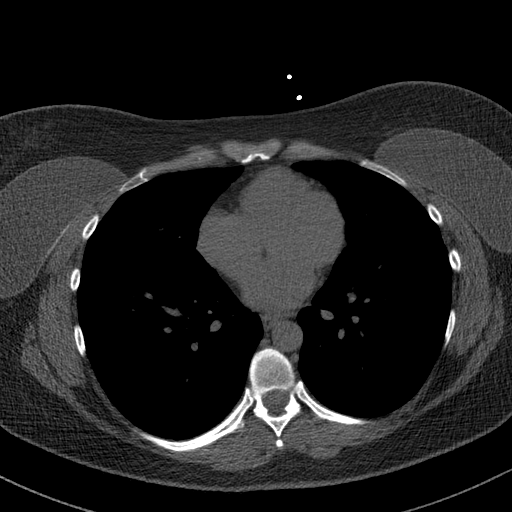
[im 45/68  vessel]
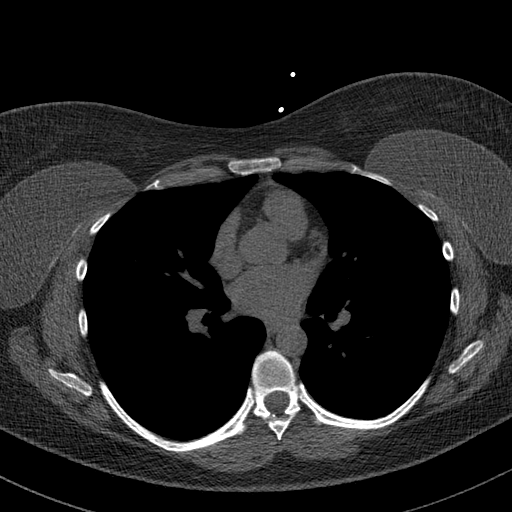
[im 56/68  vessel]
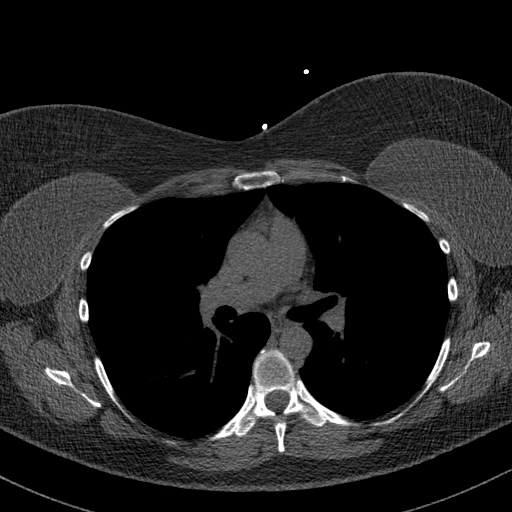
[im 56/68  lung]
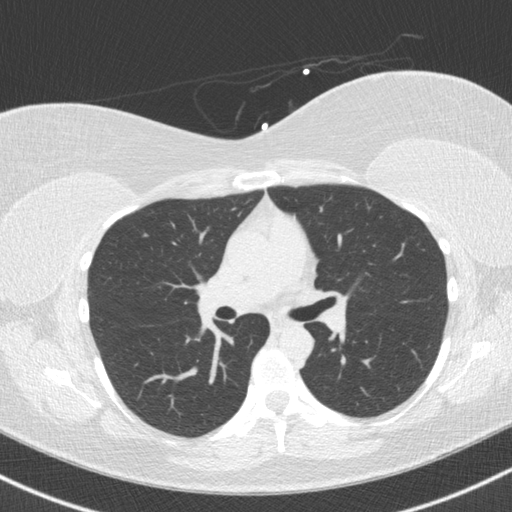

[Series 4: cascseq 2.0 br59 lung · axial · 0.65mm/px · z∈[-251,-163]mm · 5 of 68 slices shown]
[im 12/68  lung]
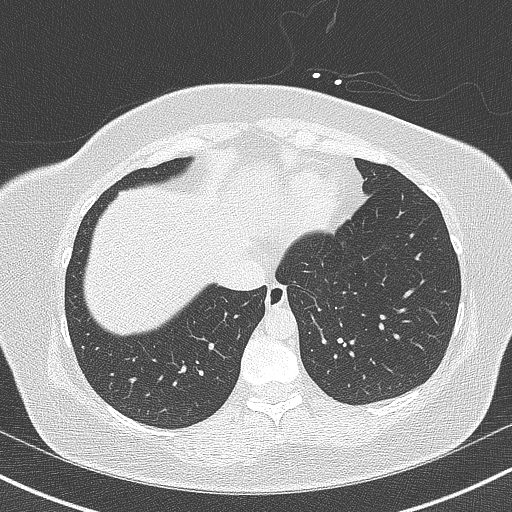
[im 23/68  lung]
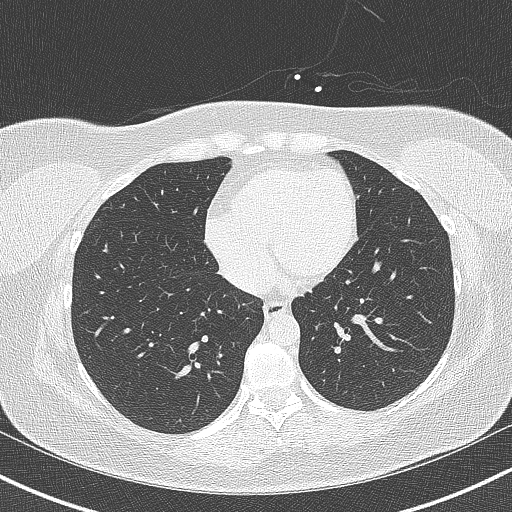
[im 34/68  lung]
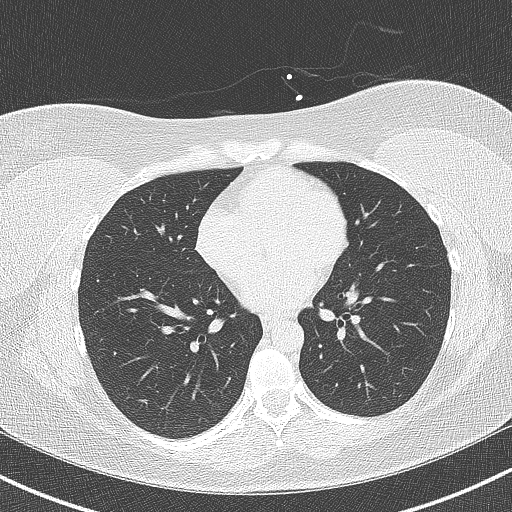
[im 45/68  lung]
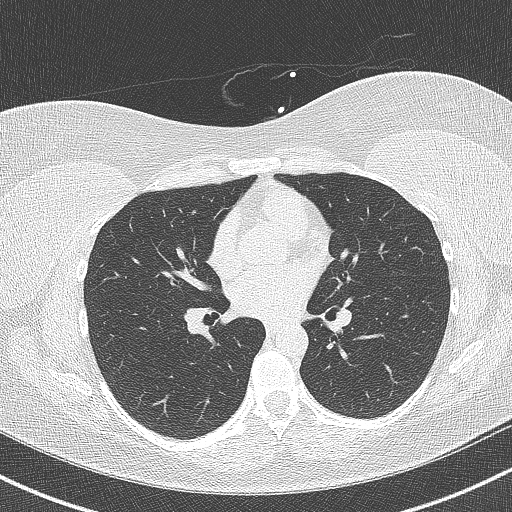
[im 56/68  lung]
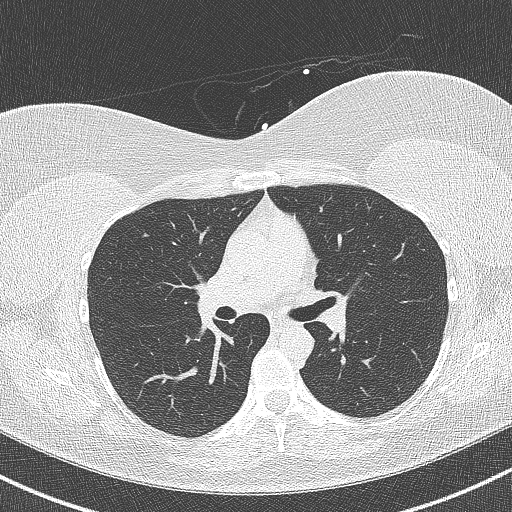

[14 of 20 positions shown; findings below may reference images not displayed]

FINDINGS: Within the visualized portions of the thorax there are no suspicious
appearing pulmonary nodules or masses, there is no acute
consolidative airspace disease, no pleural effusions, no
pneumothorax and no lymphadenopathy. Visualized portions of the
upper abdomen demonstrates severe diffuse low attenuation throughout
the visualized hepatic parenchyma, indicative of a background of
hepatic steatosis. Bilateral breast implants are incidentally noted.
There are no aggressive appearing lytic or blastic lesions noted in
the visualized portions of the skeleton.
IMPRESSION: 1. Severe hepatic steatosis.
FINDINGS: Coronary Calcium Score:

Left main: 0

Left anterior descending artery: 0

Left circumflex artery: 0

Right coronary artery: 0

Total: 0

Percentile: 0

Pericardium: Normal.

Ascending Aorta: Normal caliber.

Non-cardiac: See separate report from [REDACTED].
IMPRESSION: Coronary calcium score of 0. This was 0 percentile for age-, race-,
and sex-matched controls.



If CAC=0, it is reasonable to withhold statin therapy and reassess
in 5 to 10 years, as long as higher risk conditions are absent
(diabetes mellitus, family history of premature CHD in first degree
relatives (males <55 years; females <65 years), cigarette smoking,
or LDL >=190 mg/dL).

If CAC is 1 to 99, it is reasonable to initiate statin therapy for
patients >=55 years of age.

If CAC is >=100 or >=75th percentile, it is reasonable to initiate
statin therapy at any age.

Cardiology referral should be considered for patients with CAC
scores >=400 or >=75th percentile.

*3016 AHA/ACC/AACVPR/AAPA/ABC/OREILLY/TUCKER/AUJLA/Ciane/EPPERSON/LIENAD/HARJOT
Guideline on the Management of Blood Cholesterol: A Report of the
American College of Cardiology/American Heart Association Task Force
on Clinical Practice Guidelines. J Am Coll Cardiol.
7057;73(24):4346-4127.

*** End of Addendum ***
EXAM:
OVER-READ INTERPRETATION  CT CHEST

The following report is an over-read performed by radiologist Dr.
Keranovic Diana [REDACTED] on 05/11/2021. This
over-read does not include interpretation of cardiac or coronary
anatomy or pathology. The coronary calcium score interpretation by
the cardiologist is attached.
FINDINGS: Within the visualized portions of the thorax there are no suspicious
appearing pulmonary nodules or masses, there is no acute
consolidative airspace disease, no pleural effusions, no
pneumothorax and no lymphadenopathy. Visualized portions of the
upper abdomen demonstrates severe diffuse low attenuation throughout
the visualized hepatic parenchyma, indicative of a background of
hepatic steatosis. Bilateral breast implants are incidentally noted.
There are no aggressive appearing lytic or blastic lesions noted in
the visualized portions of the skeleton.
IMPRESSION: 1. Severe hepatic steatosis.

## 2023-08-02 ENCOUNTER — Ambulatory Visit: Attending: General Surgery

## 2023-08-02 VITALS — Wt 155.0 lb

## 2023-08-02 DIAGNOSIS — Z483 Aftercare following surgery for neoplasm: Secondary | ICD-10-CM | POA: Insufficient documentation

## 2023-08-02 NOTE — Therapy (Signed)
 OUTPATIENT PHYSICAL THERAPY SOZO SCREENING NOTE   Patient Name: [name] MRN: [id number] DOB:11-Mar-1975, 48 y.o., female Today's Date: 08/02/2023  PCP: Annette Barters, MD REFERRING PROVIDER: Caralyn Chandler, MD   PT End of Session - 08/02/23 1112     Visit Number 14   # unchanged due to screen only   PT Start Time 1109    PT Stop Time 1115    PT Time Calculation (min) 6 min    Activity Tolerance Patient tolerated treatment well    Behavior During Therapy [facility] for tasks assessed/performed          Past Medical History:  Diagnosis Date   Abnormal Pap smear    Anxiety    Breast cancer (HCC)    Breast mass    left   Depression    GERD (gastroesophageal reflux disease)    Headache(784.0)    migraines   History of miscarriage    History of radiation therapy    Right breast- 10/01/22-11/15/22- Dr. Retta Caster   Hypertension    Incomplete RBBB    Palpitations    Syncope and collapse    Tachycardia    Vaginal Pap smear, abnormal    Past Surgical History:  Procedure Laterality Date   BREAST BIOPSY Right 07/31/2022   US  RT BREAST BX W LOC DEV 1ST LESION IMG BX SPEC US  GUIDE 07/31/2022 [facility]   BREAST BIOPSY  08/16/2022   MM RT RADIOACTIVE SEED LOC MAMMO GUIDE 08/16/2022 [facility]   BREAST ENHANCEMENT SURGERY  2012   BREAST LUMPECTOMY WITH RADIOACTIVE SEED AND SENTINEL LYMPH NODE BIOPSY Right 08/20/2022   Procedure: RIGHT BREAST LUMPECTOMY WITH RADIOACTIVE SEED AND SENTINEL LYMPH NODE BIOPSY;  Surgeon: Lillette Reid III, MD;  Location: [facility];  Service: General;  Laterality: Right;  PEC BLOCK   CHOLECYSTECTOMY     DILATION AND CURETTAGE OF UTERUS  2008   LIPOMA EXCISION  2010   left leg   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   US  ECHOCARDIOGRAPHY  06/17/2007   EF 55-60%   Patient Active Problem List   Diagnosis Date Noted   Genetic testing 08/16/2022   Family history of colon cancer  in father 08/08/2022   Malignant neoplasm of lower-outer quadrant of right breast of female, estrogen receptor positive (HCC) 08/06/2022   Preterm premature rupture of membranes 06/03/2017   Advanced maternal age in multigravida 12/20/2016   Abnormal chromosomal and genetic finding on antenatal screening of mother 12/20/2016   [redacted] weeks gestation of pregnancy    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 She underwent a right lumpectomy and sentinel node biopsy (5 negative nodes) on 08/20/2022. It is ER/PR positive and HER2 negative with a Ki67 of 3%. She had breast implants placed in 2012  PRECAUTIONS: right UE Lymphedema risk, None  SUBJECTIVE: Pt returns for her 3 month L-Dex screen. I've done better with watching my weight progression at the gym like we talked about.   PAIN:  Are you having pain? No  SOZO SCREENING: Patient was assessed today using the SOZO machine to determine the lymphedema index score.  This was compared to her baseline score. It was determined that she is within the recommended range when compared to her baseline and no further action is needed at this time. She will continue SOZO screenings. These are done every 3 months for 2 years post operatively followed by every 6 months for 2 years, and then annually.    L-DEX FLOWSHEETS - 08/02/23 1100       L-DEX LYMPHEDEMA SCREENING   Measurement Type Unilateral    L-DEX MEASUREMENT EXTREMITY Upper Extremity    POSITION  Standing    DOMINANT SIDE Right    At Risk Side Right    BASELINE SCORE (UNILATERAL) -1.6    L-DEX SCORE (UNILATERAL) 1.2    VALUE CHANGE (UNILAT) 2.8         P:  Return to 3 month screens and pt knows she can call or return sooner prn.    [name], PTA 08/02/2023, 11:20 AM

## 2023-08-06 ENCOUNTER — Ambulatory Visit

## 2023-08-07 DIAGNOSIS — Z1211 Encounter for screening for malignant neoplasm of colon: Secondary | ICD-10-CM | POA: Diagnosis not present

## 2023-08-07 DIAGNOSIS — Z6832 Body mass index (BMI) 32.0-32.9, adult: Secondary | ICD-10-CM | POA: Diagnosis not present

## 2023-08-07 DIAGNOSIS — Z01419 Encounter for gynecological examination (general) (routine) without abnormal findings: Secondary | ICD-10-CM | POA: Diagnosis not present

## 2023-08-07 DIAGNOSIS — N390 Urinary tract infection, site not specified: Secondary | ICD-10-CM | POA: Diagnosis not present

## 2023-08-07 DIAGNOSIS — Z1339 Encounter for screening examination for other mental health and behavioral disorders: Secondary | ICD-10-CM | POA: Diagnosis not present

## 2023-08-07 DIAGNOSIS — Z3040 Encounter for surveillance of contraceptives, unspecified: Secondary | ICD-10-CM | POA: Diagnosis not present

## 2023-08-07 DIAGNOSIS — C50911 Malignant neoplasm of unspecified site of right female breast: Secondary | ICD-10-CM | POA: Diagnosis not present

## 2023-08-16 DIAGNOSIS — R42 Dizziness and giddiness: Secondary | ICD-10-CM | POA: Diagnosis not present

## 2023-09-03 ENCOUNTER — Ambulatory Visit: Payer: 59 | Admitting: Hematology and Oncology

## 2023-09-18 ENCOUNTER — Telehealth: Payer: Self-pay

## 2023-09-18 NOTE — Telephone Encounter (Signed)
 Pt verbally confirmed appt for 7/31

## 2023-09-19 ENCOUNTER — Inpatient Hospital Stay: Attending: Hematology and Oncology | Admitting: Hematology and Oncology

## 2023-09-19 ENCOUNTER — Encounter: Payer: Self-pay | Admitting: Hematology and Oncology

## 2023-09-19 ENCOUNTER — Inpatient Hospital Stay

## 2023-09-19 VITALS — BP 129/90 | HR 82 | Temp 98.7°F | Resp 19 | Wt 159.2 lb

## 2023-09-19 DIAGNOSIS — Z79899 Other long term (current) drug therapy: Secondary | ICD-10-CM | POA: Diagnosis not present

## 2023-09-19 DIAGNOSIS — Z923 Personal history of irradiation: Secondary | ICD-10-CM | POA: Diagnosis not present

## 2023-09-19 DIAGNOSIS — Z1721 Progesterone receptor positive status: Secondary | ICD-10-CM | POA: Insufficient documentation

## 2023-09-19 DIAGNOSIS — Z79810 Long term (current) use of selective estrogen receptor modulators (SERMs): Secondary | ICD-10-CM | POA: Insufficient documentation

## 2023-09-19 DIAGNOSIS — Z1732 Human epidermal growth factor receptor 2 negative status: Secondary | ICD-10-CM | POA: Insufficient documentation

## 2023-09-19 DIAGNOSIS — Z170 Estrogen receptor positive status [ER+]: Secondary | ICD-10-CM

## 2023-09-19 DIAGNOSIS — C50511 Malignant neoplasm of lower-outer quadrant of right female breast: Secondary | ICD-10-CM | POA: Insufficient documentation

## 2023-09-19 LAB — CMP (CANCER CENTER ONLY)
ALT: 19 U/L (ref 0–44)
AST: 19 U/L (ref 15–41)
Albumin: 4.5 g/dL (ref 3.5–5.0)
Alkaline Phosphatase: 46 U/L (ref 38–126)
Anion gap: 8 (ref 5–15)
BUN: 15 mg/dL (ref 6–20)
CO2: 30 mmol/L (ref 22–32)
Calcium: 9.4 mg/dL (ref 8.9–10.3)
Chloride: 100 mmol/L (ref 98–111)
Creatinine: 1 mg/dL (ref 0.44–1.00)
GFR, Estimated: >60 mL/min (ref >60)
Glucose, Bld: 80 mg/dL (ref 70–99)
Potassium: 4.1 mmol/L (ref 3.5–5.1)
Sodium: 138 mmol/L (ref 135–145)
Total Bilirubin: 0.5 mg/dL (ref 0.0–1.2)
Total Protein: 7.7 g/dL (ref 6.5–8.1)

## 2023-09-19 LAB — CBC WITH DIFFERENTIAL/PLATELET
Abs Immature Granulocytes: 0.01 K/uL (ref 0.00–0.07)
Basophils Absolute: 0 K/uL (ref 0.0–0.1)
Basophils Relative: 1 %
Eosinophils Absolute: 0.1 K/uL (ref 0.0–0.5)
Eosinophils Relative: 1 %
HCT: 44 % (ref 36.0–46.0)
Hemoglobin: 15.5 g/dL — ABNORMAL HIGH (ref 12.0–15.0)
Immature Granulocytes: 0 %
Lymphocytes Relative: 28 %
Lymphs Abs: 2 K/uL (ref 0.7–4.0)
MCH: 32 pg (ref 26.0–34.0)
MCHC: 35.2 g/dL (ref 30.0–36.0)
MCV: 90.9 fL (ref 80.0–100.0)
Monocytes Absolute: 0.4 K/uL (ref 0.1–1.0)
Monocytes Relative: 5 %
Neutro Abs: 4.6 K/uL (ref 1.7–7.7)
Neutrophils Relative %: 65 %
Platelets: 322 K/uL (ref 150–400)
RBC: 4.84 MIL/uL (ref 3.87–5.11)
RDW: 12 % (ref 11.5–15.5)
WBC: 7.1 K/uL (ref 4.0–10.5)
nRBC: 0 % (ref 0.0–0.2)

## 2023-09-19 NOTE — Assessment & Plan Note (Signed)
 This is a pleasant 48 year old premenopausal female patient with newly diagnosed right breast grade 2 IDC, ER/PR positive HER2 negative, low proliferation index, grade 2 DCIS referred to breast [facility] for additional recommendations.  Oncotype of 15, hence no adj chemo.    She is now on adj tamoxifen . No AE's reported On exam, no cervical adenopathy, bilateral breasts normal to inspection and palpation. Mammogram neg for malignancy Will inquire with plastic surgery if her implants need to be exchanged CBC, CMP today RTC in 6 months or sooner as needed.  Amber Stalls MD

## 2023-09-19 NOTE — Progress Notes (Signed)
 BRIEF ONCOLOGIC HISTORY:  Oncology History  Malignant neoplasm of lower-outer quadrant of right breast of female, estrogen receptor positive (HCC)  07/27/2022 Mammogram   Patient had screening mammogram recall, underwent diagnostic mammogram, this showed suspicious 4 mm lower right breast mass. No abnormal appearing right axillary nodes.   07/31/2022 Pathology Results   Right breast needle core biopsy showed IDC grade 2, ER 90% pos, strong staining, PR 95% positive strong staining, Her 2 neg.    Genetic Testing   [facility] Common Cancer Panel+RNA was Negative. Report date is 08/17/2022.  The Common Hereditary Cancers Panel offered by [facility] includes sequencing and/or deletion duplication testing of the following 48 genes: APC, ATM, AXIN2, BAP1, BARD1, BMPR1A, BRCA1, BRCA2, BRIP1, CDH1, CDK4, CDKN2A (p14ARF and p16INK4a only), CHEK2, CTNNA1, DICER1, EPCAM (Deletion/duplication testing only), FH, GREM1 (promoter region duplication testing only), HOXB13, KIT, MBD4, MEN1, MLH1, MSH2, MSH3, MSH6, MUTYH, NF1, NHTL1, PALB2, PDGFRA, PMS2, POLD1, POLE, PTEN, RAD51C, RAD51D, SDHA (sequencing analysis only except exon 14), SDHB, SDHC, SDHD, SMAD4, SMARCA4. STK11, TP53, TSC1, TSC2, and VHL.   08/08/2022 Cancer Staging   Staging form: Breast, AJCC 8th Edition - Clinical stage from 08/08/2022: Stage IA (cT1a, cN0, cM0, G2, ER+, PR+, HER2-) - Signed by Loretha Ash, MD on 01/21/2023 Stage prefix: Initial diagnosis Histologic grading system: 3 grade system   08/20/2022 Surgery   Right breast lumpectomy: IDC, 0.7cm, grade 2, margins negative, ER/PR +, HER2 Neg, Ki67 3%, 5 SLN negative   08/20/2022 Cancer Staging   Staging form: Breast, AJCC 8th Edition - Pathologic stage from 08/20/2022: Stage IA (pT1b, pN0, cM0, G2, ER+, PR+, HER2-) - Signed by Crawford Morna Pickle, NP on 03/04/2023 Stage prefix: Initial diagnosis Histologic grading system: 3 grade system   10/01/2022 - 11/15/2022 Radiation Therapy   Plan  Name: Breast_R Site: Breast, Right Technique: 3D Mode: Photon Dose Per Fraction: 1.8 Gy Prescribed Dose (Delivered / Prescribed): 50.4 Gy / 50.4 Gy Prescribed Fxs (Delivered / Prescribed): 28 / 28   Plan Name: Breast_R_Bst Site: Breast, Right Technique: 3D Mode: Photon Dose Per Fraction: 2 Gy Prescribed Dose (Delivered / Prescribed): 10 Gy / 10 Gy Prescribed Fxs (Delivered / Prescribed): 5 / 5   10/2022 -  Anti-estrogen oral therapy   Tamoxifen      INTERVAL HISTORY:   [name] is here for follow up. She is doing well, had great time at the beach. Her kids are getting ready to go back to school. She is taking tamoxifen  regularly. She denies any side effects at all. She had her mammogram done, b density, no concern, post treatment changes.  REVIEW OF SYSTEMS:  Review of Systems  Constitutional:  Negative for appetite change, chills, fatigue, fever and unexpected weight change.  HENT:   Negative for hearing loss, lump/mass and trouble swallowing.   Eyes:  Negative for eye problems and icterus.  Respiratory:  Negative for chest tightness, cough and shortness of breath.   Cardiovascular:  Negative for chest pain, leg swelling and palpitations.  Gastrointestinal:  Negative for abdominal distention, abdominal pain, constipation, diarrhea, nausea and vomiting.  Endocrine: Negative for hot flashes.  Genitourinary:  Negative for difficulty urinating.   Musculoskeletal:  Negative for arthralgias.  Skin:  Negative for itching and rash.  Neurological:  Negative for dizziness, extremity weakness, headaches and numbness.  Hematological:  Negative for adenopathy. Does not bruise/bleed easily.  Psychiatric/Behavioral:  Negative for depression. The patient is not nervous/anxious.   Breast: Denies any new nodularity, masses, tenderness, nipple changes, or  nipple discharge.       PAST MEDICAL/SURGICAL HISTORY:  Past Medical History:  Diagnosis Date   Abnormal Pap smear    Anxiety     Breast cancer (HCC)    Breast mass    left   Depression    GERD (gastroesophageal reflux disease)    Headache(784.0)    migraines   History of miscarriage    History of radiation therapy    Right breast- 10/01/22-11/15/22- Dr. Lynwood Nasuti   Hypertension    Incomplete RBBB    Palpitations    Syncope and collapse    Tachycardia    Vaginal Pap smear, abnormal    Past Surgical History:  Procedure Laterality Date   BREAST BIOPSY Right 07/31/2022   US  RT BREAST BX W LOC DEV 1ST LESION IMG BX SPEC US  GUIDE 07/31/2022 [facility]   BREAST BIOPSY  08/16/2022   MM RT RADIOACTIVE SEED LOC MAMMO GUIDE 08/16/2022 [facility]   BREAST ENHANCEMENT SURGERY  2012   BREAST LUMPECTOMY WITH RADIOACTIVE SEED AND SENTINEL LYMPH NODE BIOPSY Right 08/20/2022   Procedure: RIGHT BREAST LUMPECTOMY WITH RADIOACTIVE SEED AND SENTINEL LYMPH NODE BIOPSY;  Surgeon: Curvin Deward MOULD, MD;  Location: [facility];  Service: General;  Laterality: Right;  PEC BLOCK   CHOLECYSTECTOMY     DILATION AND CURETTAGE OF UTERUS  2008   LIPOMA EXCISION  2010   left leg   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   hydrochlorothiazide (HYDRODIURIL) 12.5 MG tablet 1 tablet daily.   ibuprofen  (ADVIL ) 800 MG tablet Take 800 mg by mouth every 8 (eight) hours as needed.   LORazepam  (ATIVAN ) 1 MG tablet Take 1 mg by mouth as directed.   omeprazole (PRILOSEC) 20 MG capsule Take 20 mg by mouth daily.   OVER THE COUNTER MEDICATION Pt taking  semaglutide  injection weekly   propranolol (INDERAL) 20 MG tablet Take 20 mg by mouth 2 (two) times daily.   tamoxifen  (NOLVADEX ) 20 MG tablet Take 1 tablet (20 mg total) by mouth daily.   Cholecalciferol (VITAMIN D) 125 MCG (5000 UT) CAPS Take by mouth. (Patient not taking: Reported on 09/19/2023)   No facility-administered encounter medications on file as of 09/19/2023.     ONCOLOGIC FAMILY HISTORY:  Family History  Problem Relation Age of Onset   Hypertension Mother    Hyperlipidemia Mother    Heart disease Mother    COPD Mother    Heart attack Father        X2   Heart disease Father    Hypertension Father    Colon cancer Father 31   Crohn's disease Brother    COPD Maternal Grandmother    Diabetes Maternal Grandmother    Stomach cancer Neg Hx    Esophageal cancer Neg Hx      SOCIAL HISTORY:  Social History   Socioeconomic History   Marital status: Married    Spouse name: Not on file   Number of  children: 1   Years of education: Not on file   Highest education level: Not on file  Occupational History    Employer: UNEMPLOYED  Tobacco Use   Smoking status: Former    Current packs/day: 0.25    Types: Cigarettes   Smokeless tobacco: Never  Vaping Use   Vaping status: Never Used  Substance and Sexual Activity   Alcohol use: No   Drug use: No   Sexual activity: Yes    Birth control/protection: None, Other-see comments    Comment: husband s/p vasectomy in 2020  Other Topics Concern   Not on file  Social History Narrative   Not on file   Social Drivers of Health   Financial Resource Strain: Not on file  Food Insecurity: No Food Insecurity (08/08/2022)   Hunger Vital Sign    Worried About Running Out of Food in the Last Year: Never true    Ran Out of Food in the Last Year: Never true  Transportation Needs: No Transportation Needs (08/08/2022)   PRAPARE - Administrator, Civil Service (Medical): No    Lack of Transportation (Non-Medical): No  Physical Activity:  Not on file  Stress: Not on file  Social Connections: Not on file  Intimate Partner Violence: Not At Risk (08/08/2022)   Humiliation, Afraid, Rape, and Kick questionnaire    Fear of Current or Ex-Partner: No    Emotionally Abused: No    Physically Abused: No    Sexually Abused: No     OBSERVATIONS/OBJECTIVE:  BP (!) 129/90 (BP Location: Left Arm, Cuff Size: Normal)   Pulse 82   Temp 98.7 F (37.1 C) (Temporal)   Resp 19   Wt 159 lb 3.2 oz (72.2 kg)   SpO2 100%   BMI 25.70 kg/m  GENERAL: Patient is a well appearing female in no acute distress Bilateral breasts with retropectoral implants please. No def palpable masses No regional adenopathy   LABORATORY DATA:  None for this visit.  DIAGNOSTIC IMAGING:  None for this visit.      ASSESSMENT AND PLAN:  [name].. [name] is a pleasant 48 y.o. female with Stage IA right breast invasive ductal carcinoma, ER+/PR+/HER2-, diagnosed in 07/2022, treated with lumpectomy, adjuvant radiation therapy, and anti-estrogen therapy with Tamoxifen  beginning in 10/2022.    Malignant neoplasm of lower-outer quadrant of right breast of female, estrogen receptor positive (HCC) This is a pleasant 48 year old premenopausal female patient with newly diagnosed right breast grade 2 IDC, ER/PR positive HER2 negative, low proliferation index, grade 2 DCIS referred to breast [facility] for additional recommendations.  Oncotype of 15, hence no adj chemo.    She is now on adj tamoxifen . No AE's reported On exam, no cervical adenopathy, bilateral breasts normal to inspection and palpation. Mammogram neg for malignancy Will inquire with plastic surgery if her implants need to be exchanged CBC, CMP today RTC in 6 months or sooner as needed.  Amber Stalls MD     Total encounter time:20 minutes*in face-to-face visit time, chart review, lab review, care coordination, order entry, and documentation of the encounter time.    *Total Encounter Time as defined by the  Centers for Medicare and Medicaid Services includes, in addition to the face-to-face time of a patient visit (documented in the note above) non-face-to-face time: obtaining and reviewing outside history, ordering and reviewing medications, tests or procedures, care coordination (communications with other health care professionals or caregivers) and documentation in the medical record.

## 2023-09-27 DIAGNOSIS — C50911 Malignant neoplasm of unspecified site of right female breast: Secondary | ICD-10-CM | POA: Diagnosis not present

## 2023-09-27 DIAGNOSIS — Z79899 Other long term (current) drug therapy: Secondary | ICD-10-CM | POA: Diagnosis not present

## 2023-09-27 DIAGNOSIS — Z1331 Encounter for screening for depression: Secondary | ICD-10-CM | POA: Diagnosis not present

## 2023-09-27 DIAGNOSIS — F419 Anxiety disorder, unspecified: Secondary | ICD-10-CM | POA: Diagnosis not present

## 2023-09-27 DIAGNOSIS — R002 Palpitations: Secondary | ICD-10-CM | POA: Diagnosis not present

## 2023-09-27 DIAGNOSIS — I10 Essential (primary) hypertension: Secondary | ICD-10-CM | POA: Diagnosis not present

## 2023-09-27 DIAGNOSIS — G43909 Migraine, unspecified, not intractable, without status migrainosus: Secondary | ICD-10-CM | POA: Diagnosis not present

## 2023-09-27 DIAGNOSIS — K219 Gastro-esophageal reflux disease without esophagitis: Secondary | ICD-10-CM | POA: Diagnosis not present

## 2023-10-29 DIAGNOSIS — L814 Other melanin hyperpigmentation: Secondary | ICD-10-CM | POA: Diagnosis not present

## 2023-10-29 DIAGNOSIS — D1801 Hemangioma of skin and subcutaneous tissue: Secondary | ICD-10-CM | POA: Diagnosis not present

## 2023-10-29 DIAGNOSIS — D225 Melanocytic nevi of trunk: Secondary | ICD-10-CM | POA: Diagnosis not present

## 2023-10-29 DIAGNOSIS — L821 Other seborrheic keratosis: Secondary | ICD-10-CM | POA: Diagnosis not present

## 2023-10-29 DIAGNOSIS — L853 Xerosis cutis: Secondary | ICD-10-CM | POA: Diagnosis not present

## 2023-11-11 ENCOUNTER — Ambulatory Visit

## 2023-11-18 ENCOUNTER — Other Ambulatory Visit: Payer: Self-pay | Admitting: Hematology and Oncology

## 2023-12-02 ENCOUNTER — Ambulatory Visit: Attending: General Surgery

## 2023-12-02 VITALS — Wt 160.1 lb

## 2023-12-02 DIAGNOSIS — Z483 Aftercare following surgery for neoplasm: Secondary | ICD-10-CM | POA: Insufficient documentation

## 2023-12-02 NOTE — Therapy (Signed)
 OUTPATIENT PHYSICAL THERAPY SOZO SCREENING NOTE   Patient Name: [name] MRN: [id number] DOB:May 08, 1975, 48 y.o., female Today's Date: 12/02/2023  PCP: Stephanie Charlene CROME, MD REFERRING PROVIDER: Curvin Deward MOULD, MD   PT End of Session - 12/02/23 [phone]     Visit Number 14   # unchanged due to screen only   PT Start Time 0921    PT Stop Time 0925    PT Time Calculation (min) 4 min    Activity Tolerance Patient tolerated treatment well    Behavior During Therapy [facility] for tasks assessed/performed          Past Medical History:  Diagnosis Date   Abnormal Pap smear    Anxiety    Breast cancer (HCC)    Breast mass    left   Depression    GERD (gastroesophageal reflux disease)    Headache(784.0)    migraines   History of miscarriage    History of radiation therapy    Right breast- 10/01/22-11/15/22- Dr. Lynwood Nasuti   Hypertension    Incomplete RBBB    Palpitations    Syncope and collapse    Tachycardia    Vaginal Pap smear, abnormal    Past Surgical History:  Procedure Laterality Date   BREAST BIOPSY Right 07/31/2022   US  RT BREAST BX W LOC DEV 1ST LESION IMG BX SPEC US  GUIDE 07/31/2022 [facility]   BREAST BIOPSY  08/16/2022   MM RT RADIOACTIVE SEED LOC MAMMO GUIDE 08/16/2022 [facility]   BREAST ENHANCEMENT SURGERY  2012   BREAST LUMPECTOMY WITH RADIOACTIVE SEED AND SENTINEL LYMPH NODE BIOPSY Right 08/20/2022   Procedure: RIGHT BREAST LUMPECTOMY WITH RADIOACTIVE SEED AND SENTINEL LYMPH NODE BIOPSY;  Surgeon: Curvin Deward III, MD;  Location: [facility];  Service: General;  Laterality: Right;  PEC BLOCK   CHOLECYSTECTOMY     DILATION AND CURETTAGE OF UTERUS  2008   LIPOMA EXCISION  2010   left leg   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   US  ECHOCARDIOGRAPHY  06/17/2007   EF 55-60%   Patient Active Problem List   Diagnosis Date Noted   Genetic testing 08/16/2022   Family history of colon cancer  in father 08/08/2022   Malignant neoplasm of lower-outer quadrant of right breast of female, estrogen receptor positive (HCC) 08/06/2022   Preterm premature rupture of membranes 06/03/2017   Advanced maternal age in multigravida 12/20/2016   Abnormal chromosomal and genetic finding on antenatal screening of mother 12/20/2016   [redacted] weeks gestation of pregnancy    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 She underwent a right lumpectomy and sentinel node biopsy (5 negative nodes) on 08/20/2022. It is ER/PR positive and HER2 negative with a Ki67 of 3%. She had breast implants placed in 2012  PRECAUTIONS: right UE Lymphedema risk, None  SUBJECTIVE: Pt returns for her 3 month L-Dex screen.   PAIN:  Are you having pain? No  SOZO SCREENING: Patient was assessed today using the SOZO machine to determine the lymphedema index score. This was compared to her baseline score. It was determined that she is within the recommended  range when compared to her baseline and no further action is needed at this time. She will continue SOZO screenings. These are done every 3 months for 2 years post operatively followed by every 6 months for 2 years, and then annually.    L-DEX FLOWSHEETS - 12/02/23 0900       L-DEX LYMPHEDEMA SCREENING   Measurement Type Unilateral    L-DEX MEASUREMENT EXTREMITY Upper Extremity    POSITION  Standing    DOMINANT SIDE Right    At Risk Side Right    BASELINE SCORE (UNILATERAL) -1.6    L-DEX SCORE (UNILATERAL) 2.9    VALUE CHANGE (UNILAT) 4.5         P: Cont every 3 month screens until 2 years from breast surgery, then transition to 6 month x 2 more  years.    Aden Berwyn Caldron, PTA 12/02/2023, 9:26 AM

## 2024-01-10 DIAGNOSIS — R102 Pelvic and perineal pain unspecified side: Secondary | ICD-10-CM | POA: Diagnosis not present

## 2024-01-15 DIAGNOSIS — N939 Abnormal uterine and vaginal bleeding, unspecified: Secondary | ICD-10-CM | POA: Diagnosis not present

## 2024-02-03 DIAGNOSIS — Z79810 Long term (current) use of selective estrogen receptor modulators (SERMs): Secondary | ICD-10-CM | POA: Diagnosis not present

## 2024-02-03 DIAGNOSIS — N926 Irregular menstruation, unspecified: Secondary | ICD-10-CM | POA: Diagnosis not present

## 2024-02-03 DIAGNOSIS — N8003 Adenomyosis of the uterus: Secondary | ICD-10-CM | POA: Diagnosis not present

## 2024-02-03 DIAGNOSIS — Z853 Personal history of malignant neoplasm of breast: Secondary | ICD-10-CM | POA: Diagnosis not present

## 2024-02-08 DIAGNOSIS — J019 Acute sinusitis, unspecified: Secondary | ICD-10-CM | POA: Diagnosis not present

## 2024-02-08 DIAGNOSIS — R0981 Nasal congestion: Secondary | ICD-10-CM | POA: Diagnosis not present

## 2024-02-08 DIAGNOSIS — R051 Acute cough: Secondary | ICD-10-CM | POA: Diagnosis not present

## 2024-03-02 ENCOUNTER — Ambulatory Visit: Payer: Self-pay | Attending: General Surgery

## 2024-03-02 VITALS — Wt 157.1 lb

## 2024-03-02 DIAGNOSIS — Z483 Aftercare following surgery for neoplasm: Secondary | ICD-10-CM | POA: Insufficient documentation

## 2024-03-02 NOTE — Therapy (Signed)
 " OUTPATIENT PHYSICAL THERAPY SOZO SCREENING NOTE   Patient Name: [name] MRN: [id number] DOB:23-Jan-1976, 49 y.o., female Today's Date: 03/02/2024  PCP: Stephanie Charlene CROME, MD REFERRING PROVIDER: Curvin Deward MOULD, MD   PT End of Session - 03/02/24 [phone]     Visit Number 14   # unchanged today due to screen only   PT Start Time 0921    PT Stop Time 0925    PT Time Calculation (min) 4 min    Activity Tolerance Patient tolerated treatment well    Behavior During Therapy [facility] for tasks assessed/performed          Past Medical History:  Diagnosis Date   Abnormal Pap smear    Anxiety    Breast cancer (HCC)    Breast mass    left   Depression    GERD (gastroesophageal reflux disease)    Headache(784.0)    migraines   History of miscarriage    History of radiation therapy    Right breast- 10/01/22-11/15/22- Dr. Lynwood Nasuti   Hypertension    Incomplete RBBB    Palpitations    Syncope and collapse    Tachycardia    Vaginal Pap smear, abnormal    Past Surgical History:  Procedure Laterality Date   BREAST BIOPSY Right 07/31/2022   US  RT BREAST BX W LOC DEV 1ST LESION IMG BX SPEC US  GUIDE 07/31/2022 [facility]   BREAST BIOPSY  08/16/2022   MM RT RADIOACTIVE SEED LOC MAMMO GUIDE 08/16/2022 [facility]   BREAST ENHANCEMENT SURGERY  2012   BREAST LUMPECTOMY WITH RADIOACTIVE SEED AND SENTINEL LYMPH NODE BIOPSY Right 08/20/2022   Procedure: RIGHT BREAST LUMPECTOMY WITH RADIOACTIVE SEED AND SENTINEL LYMPH NODE BIOPSY;  Surgeon: Curvin Deward III, MD;  Location: [facility];  Service: General;  Laterality: Right;  PEC BLOCK   CHOLECYSTECTOMY     DILATION AND CURETTAGE OF UTERUS  2008   LIPOMA EXCISION  2010   left leg   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   US  ECHOCARDIOGRAPHY  06/17/2007   EF 55-60%   Patient Active Problem List   Diagnosis Date Noted   Genetic testing 08/16/2022   Family history of colon  cancer in father 08/08/2022   Malignant neoplasm of lower-outer quadrant of right breast of female, estrogen receptor positive (HCC) 08/06/2022   Preterm premature rupture of membranes 06/03/2017   Advanced maternal age in multigravida 12/20/2016   Abnormal chromosomal and genetic finding on antenatal screening of mother 12/20/2016   [redacted] weeks gestation of pregnancy    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 She underwent a right lumpectomy and sentinel node biopsy (5 negative nodes) on 08/20/2022. It is ER/PR positive and HER2 negative with a Ki67 of 3%. She had breast implants placed in 2012  PRECAUTIONS: right UE Lymphedema risk, None  SUBJECTIVE: Pt returns for her 3 month L-Dex screen.   PAIN:  Are you having pain? No  SOZO SCREENING: Patient was assessed today using the SOZO machine to determine the lymphedema index score. This was compared to her baseline score. It was determined that she is within  the recommended range when compared to her baseline and no further action is needed at this time. She will continue SOZO screenings. These are done every 3 months for 2 years post operatively followed by every 6 months for 2 years, and then annually.    L-DEX FLOWSHEETS - 03/02/24 0900       L-DEX LYMPHEDEMA SCREENING   Measurement Type Unilateral    L-DEX MEASUREMENT EXTREMITY Upper Extremity    POSITION  Standing    DOMINANT SIDE Right    At Risk Side Right    BASELINE SCORE (UNILATERAL) -1.6    L-DEX SCORE (UNILATERAL) 1    VALUE CHANGE (UNILAT) 2.6         P: Cont every 3 month screens until 08/2024, then transition to 6 months until 08/2026.     [name], PTA 03/02/2024, 9:26 AM      "

## 2024-03-17 ENCOUNTER — Telehealth: Payer: Self-pay | Admitting: Hematology and Oncology

## 2024-03-17 ENCOUNTER — Inpatient Hospital Stay: Payer: Self-pay | Admitting: Hematology and Oncology

## 2024-03-17 NOTE — Telephone Encounter (Signed)
 I spoke with patient and she is scheduled for MD appointment on 04/24/2024.

## 2024-03-17 NOTE — Telephone Encounter (Signed)
 I spoke with patient as she called in to reschedule appointment from 03/17/2024 to 04/08/2024.

## 2024-04-08 ENCOUNTER — Inpatient Hospital Stay: Payer: Self-pay | Admitting: Hematology and Oncology

## 2024-04-24 ENCOUNTER — Inpatient Hospital Stay: Payer: Self-pay | Admitting: Hematology and Oncology

## 2024-06-01 ENCOUNTER — Ambulatory Visit: Payer: Self-pay
